# Patient Record
Sex: Male | Born: 1940 | Race: Black or African American | Hispanic: No | Marital: Single | Smoking: Never smoker
Health system: Southern US, Community
[De-identification: ages and names within clinical notes are randomized; demographics above are authoritative.]

## PROBLEM LIST (undated history)

## (undated) DIAGNOSIS — E7800 Pure hypercholesterolemia, unspecified: Secondary | ICD-10-CM

## (undated) DIAGNOSIS — C61 Malignant neoplasm of prostate: Secondary | ICD-10-CM

## (undated) DIAGNOSIS — F32A Depression, unspecified: Secondary | ICD-10-CM

## (undated) DIAGNOSIS — F4310 Post-traumatic stress disorder, unspecified: Secondary | ICD-10-CM

## (undated) DIAGNOSIS — M48062 Spinal stenosis, lumbar region with neurogenic claudication: Secondary | ICD-10-CM

## (undated) DIAGNOSIS — F329 Major depressive disorder, single episode, unspecified: Secondary | ICD-10-CM

## (undated) HISTORY — PX: PROSTATE SURGERY: SHX751

---

## 1998-04-29 ENCOUNTER — Ambulatory Visit (HOSPITAL_BASED_OUTPATIENT_CLINIC_OR_DEPARTMENT_OTHER): Admission: RE | Admit: 1998-04-29 | Discharge: 1998-04-29 | Payer: Self-pay | Admitting: Ophthalmology

## 2001-09-15 ENCOUNTER — Encounter: Payer: Self-pay | Admitting: Internal Medicine

## 2001-09-15 ENCOUNTER — Encounter: Admission: RE | Admit: 2001-09-15 | Discharge: 2001-09-15 | Payer: Self-pay | Admitting: Internal Medicine

## 2002-08-29 ENCOUNTER — Ambulatory Visit (HOSPITAL_BASED_OUTPATIENT_CLINIC_OR_DEPARTMENT_OTHER): Admission: RE | Admit: 2002-08-29 | Discharge: 2002-08-29 | Payer: Self-pay | Admitting: Ophthalmology

## 2004-01-21 ENCOUNTER — Ambulatory Visit (HOSPITAL_BASED_OUTPATIENT_CLINIC_OR_DEPARTMENT_OTHER): Admission: RE | Admit: 2004-01-21 | Discharge: 2004-01-21 | Payer: Self-pay | Admitting: Ophthalmology

## 2005-02-23 ENCOUNTER — Ambulatory Visit (HOSPITAL_COMMUNITY): Admission: RE | Admit: 2005-02-23 | Discharge: 2005-02-23 | Payer: Self-pay | Admitting: Internal Medicine

## 2006-11-07 ENCOUNTER — Emergency Department (HOSPITAL_COMMUNITY): Admission: EM | Admit: 2006-11-07 | Discharge: 2006-11-07 | Payer: Self-pay | Admitting: Emergency Medicine

## 2010-07-08 ENCOUNTER — Encounter: Admission: RE | Admit: 2010-07-08 | Discharge: 2010-07-08 | Payer: Self-pay | Admitting: Internal Medicine

## 2011-04-10 NOTE — Op Note (Signed)
NAME:  Lance Todd, Lance Todd                        ACCOUNT NO.:  [id number]   MEDICAL RECORD NO.:  [id number]                   PATIENT TYPE:  AMB   LOCATION:  DSC                                  FACILITY:  [facility]   PHYSICIAN:  Salley Scarlet., M.D.         DATE OF BIRTH:  [birth year]   DATE OF PROCEDURE:  01/21/2004  DATE OF DISCHARGE:                                 OPERATIVE REPORT   PREOPERATIVE DIAGNOSIS:  Chalazion upper lid, left eye.   POSTOPERATIVE DIAGNOSIS:  Chalazion upper lid, left eye.   OPERATION:  Chalazion excision upper lid, left eye.   ANESTHESIA:  Local using Xylocaine 1%.   PROCEDURE:  The upper lid of the left eye was inspected and there were two  chalazion located adjacent to each other along the medial aspect of the  upper lid of the left eye.  The patient was then prepped and draped in the  usual manner.  The lid was infiltrated with several mL of Xylocaine.  The  chalazion clamp was applied over the most medial lesion.  A cruciate  incision was made in the tarsus of the lesion and the lesion was curetted  using the chalazion curet.  The sac was incised using sharp and blunt  dissection.  The chalazion clamp was then applied over the larger lesion and  the same procedure was repeated.  Polysporin ophthalmic ointment and a  pressure patch was applied.  The patient tolerated the procedure well and  was discharged to the post anesthesia recovery room in satisfactory  condition with instructions to take Vicodin every 4 hours as needed for pain  and to see me in the office tomorrow for further evaluation.   DISCHARGE DIAGNOSIS:  Multiple chalazion upper lid, left eye.                                               Salley Scarlet., M.D.    TB/MEDQ  D:  01/21/2004  T:  01/21/2004  Job:  [id number]

## 2016-12-01 ENCOUNTER — Observation Stay (HOSPITAL_COMMUNITY): Payer: Medicare Other

## 2016-12-01 ENCOUNTER — Emergency Department (HOSPITAL_COMMUNITY): Payer: Medicare Other

## 2016-12-01 ENCOUNTER — Encounter (HOSPITAL_COMMUNITY): Payer: Self-pay | Admitting: Emergency Medicine

## 2016-12-01 ENCOUNTER — Inpatient Hospital Stay (HOSPITAL_COMMUNITY)
Admission: EM | Admit: 2016-12-01 | Discharge: 2016-12-08 | DRG: 075 | Disposition: A | Discharge status: Skilled Nursing Facility | Payer: Medicare Other | Attending: Internal Medicine | Admitting: Internal Medicine

## 2016-12-01 DIAGNOSIS — B003 Herpesviral meningitis: Secondary | ICD-10-CM | POA: Diagnosis not present

## 2016-12-01 DIAGNOSIS — N183 Chronic kidney disease, stage 3 unspecified: Secondary | ICD-10-CM | POA: Diagnosis present

## 2016-12-01 DIAGNOSIS — Z79899 Other long term (current) drug therapy: Secondary | ICD-10-CM

## 2016-12-01 DIAGNOSIS — R739 Hyperglycemia, unspecified: Secondary | ICD-10-CM | POA: Diagnosis not present

## 2016-12-01 DIAGNOSIS — B009 Herpesviral infection, unspecified: Secondary | ICD-10-CM | POA: Diagnosis present

## 2016-12-01 DIAGNOSIS — R262 Difficulty in walking, not elsewhere classified: Secondary | ICD-10-CM

## 2016-12-01 DIAGNOSIS — E869 Volume depletion, unspecified: Secondary | ICD-10-CM | POA: Diagnosis present

## 2016-12-01 DIAGNOSIS — R569 Unspecified convulsions: Secondary | ICD-10-CM

## 2016-12-01 DIAGNOSIS — G4709 Other insomnia: Secondary | ICD-10-CM

## 2016-12-01 DIAGNOSIS — E7800 Pure hypercholesterolemia, unspecified: Secondary | ICD-10-CM | POA: Diagnosis present

## 2016-12-01 DIAGNOSIS — F329 Major depressive disorder, single episode, unspecified: Secondary | ICD-10-CM | POA: Diagnosis present

## 2016-12-01 DIAGNOSIS — R4182 Altered mental status, unspecified: Secondary | ICD-10-CM | POA: Diagnosis not present

## 2016-12-01 DIAGNOSIS — E784 Other hyperlipidemia: Secondary | ICD-10-CM | POA: Diagnosis not present

## 2016-12-01 DIAGNOSIS — F32A Depression, unspecified: Secondary | ICD-10-CM | POA: Diagnosis present

## 2016-12-01 DIAGNOSIS — G9341 Metabolic encephalopathy: Secondary | ICD-10-CM | POA: Diagnosis present

## 2016-12-01 DIAGNOSIS — C61 Malignant neoplasm of prostate: Secondary | ICD-10-CM | POA: Diagnosis present

## 2016-12-01 DIAGNOSIS — F4310 Post-traumatic stress disorder, unspecified: Secondary | ICD-10-CM | POA: Diagnosis present

## 2016-12-01 DIAGNOSIS — N1832 Chronic kidney disease, stage 3b: Secondary | ICD-10-CM | POA: Diagnosis present

## 2016-12-01 DIAGNOSIS — N401 Enlarged prostate with lower urinary tract symptoms: Secondary | ICD-10-CM | POA: Diagnosis present

## 2016-12-01 DIAGNOSIS — E876 Hypokalemia: Secondary | ICD-10-CM | POA: Diagnosis not present

## 2016-12-01 DIAGNOSIS — G9340 Encephalopathy, unspecified: Secondary | ICD-10-CM | POA: Diagnosis present

## 2016-12-01 DIAGNOSIS — Z8546 Personal history of malignant neoplasm of prostate: Secondary | ICD-10-CM

## 2016-12-01 DIAGNOSIS — E785 Hyperlipidemia, unspecified: Secondary | ICD-10-CM | POA: Diagnosis present

## 2016-12-01 DIAGNOSIS — R338 Other retention of urine: Secondary | ICD-10-CM | POA: Diagnosis present

## 2016-12-01 DIAGNOSIS — G4089 Other seizures: Secondary | ICD-10-CM | POA: Diagnosis present

## 2016-12-01 DIAGNOSIS — N400 Enlarged prostate without lower urinary tract symptoms: Secondary | ICD-10-CM | POA: Diagnosis present

## 2016-12-01 DIAGNOSIS — D62 Acute posthemorrhagic anemia: Secondary | ICD-10-CM | POA: Diagnosis not present

## 2016-12-01 DIAGNOSIS — G912 (Idiopathic) normal pressure hydrocephalus: Secondary | ICD-10-CM | POA: Diagnosis present

## 2016-12-01 DIAGNOSIS — G039 Meningitis, unspecified: Secondary | ICD-10-CM

## 2016-12-01 DIAGNOSIS — I471 Supraventricular tachycardia: Secondary | ICD-10-CM | POA: Diagnosis present

## 2016-12-01 DIAGNOSIS — G4700 Insomnia, unspecified: Secondary | ICD-10-CM | POA: Diagnosis present

## 2016-12-01 DIAGNOSIS — N179 Acute kidney failure, unspecified: Secondary | ICD-10-CM | POA: Diagnosis present

## 2016-12-01 DIAGNOSIS — G030 Nonpyogenic meningitis: Secondary | ICD-10-CM | POA: Diagnosis present

## 2016-12-01 HISTORY — DX: Malignant neoplasm of prostate: C61

## 2016-12-01 HISTORY — DX: Depression, unspecified: F32.A

## 2016-12-01 HISTORY — DX: Pure hypercholesterolemia, unspecified: E78.00

## 2016-12-01 HISTORY — DX: Post-traumatic stress disorder, unspecified: F43.10

## 2016-12-01 HISTORY — DX: Major depressive disorder, single episode, unspecified: F32.9

## 2016-12-01 HISTORY — DX: Spinal stenosis, lumbar region with neurogenic claudication: M48.062

## 2016-12-01 LAB — RAPID URINE DRUG SCREEN, HOSP PERFORMED
AMPHETAMINES: NOT DETECTED
Barbiturates: NOT DETECTED
Benzodiazepines: NOT DETECTED
Cocaine: NOT DETECTED
OPIATES: NOT DETECTED
Tetrahydrocannabinol: NOT DETECTED

## 2016-12-01 LAB — CBC WITH DIFFERENTIAL/PLATELET
BASOS PCT: 0 %
Basophils Absolute: 0 10*3/uL (ref 0.0–0.1)
EOS ABS: 0.1 10*3/uL (ref 0.0–0.7)
EOS PCT: 2 %
HCT: 32.7 % — ABNORMAL LOW (ref 39.0–52.0)
HEMOGLOBIN: 11.6 g/dL — AB (ref 13.0–17.0)
Lymphocytes Relative: 19 %
Lymphs Abs: 1 10*3/uL (ref 0.7–4.0)
MCH: 28.4 pg (ref 26.0–34.0)
MCHC: 35.5 g/dL (ref 30.0–36.0)
MCV: 80.1 fL (ref 78.0–100.0)
MONO ABS: 0.3 10*3/uL (ref 0.1–1.0)
MONOS PCT: 6 %
Neutro Abs: 3.7 10*3/uL (ref 1.7–7.7)
Neutrophils Relative %: 73 %
PLATELETS: 171 10*3/uL (ref 150–400)
RBC: 4.08 MIL/uL — ABNORMAL LOW (ref 4.22–5.81)
RDW: 13.4 % (ref 11.5–15.5)
WBC: 5.1 10*3/uL (ref 4.0–10.5)

## 2016-12-01 LAB — COMPREHENSIVE METABOLIC PANEL
ALBUMIN: 3.4 g/dL — AB (ref 3.5–5.0)
ALT: 15 U/L — ABNORMAL LOW (ref 17–63)
AST: 18 U/L (ref 15–41)
Alkaline Phosphatase: 41 U/L (ref 38–126)
Anion gap: 5 (ref 5–15)
BUN: 22 mg/dL — ABNORMAL HIGH (ref 6–20)
CHLORIDE: 109 mmol/L (ref 101–111)
CO2: 25 mmol/L (ref 22–32)
Calcium: 9 mg/dL (ref 8.9–10.3)
Creatinine, Ser: 2.03 mg/dL — ABNORMAL HIGH (ref 0.61–1.24)
GFR calc non Af Amer: 30 mL/min — ABNORMAL LOW (ref >60)
GFR, EST AFRICAN AMERICAN: 35 mL/min — AB (ref >60)
GLUCOSE: 152 mg/dL — AB (ref 65–99)
POTASSIUM: 4.3 mmol/L (ref 3.5–5.1)
SODIUM: 139 mmol/L (ref 135–145)
Total Bilirubin: 0.6 mg/dL (ref 0.3–1.2)
Total Protein: 7.2 g/dL (ref 6.5–8.1)

## 2016-12-01 LAB — URINALYSIS, ROUTINE W REFLEX MICROSCOPIC
BILIRUBIN URINE: NEGATIVE
GLUCOSE, UA: NEGATIVE mg/dL
HGB URINE DIPSTICK: NEGATIVE
Ketones, ur: NEGATIVE mg/dL
LEUKOCYTES UA: NEGATIVE
NITRITE: NEGATIVE
PROTEIN: 100 mg/dL — AB
Specific Gravity, Urine: 1.015 (ref 1.005–1.030)
pH: 5 (ref 5.0–8.0)

## 2016-12-01 LAB — FOLATE: FOLATE: 30.9 ng/mL (ref >5.9)

## 2016-12-01 LAB — PHOSPHORUS: Phosphorus: 1.6 mg/dL — ABNORMAL LOW (ref 2.5–4.6)

## 2016-12-01 LAB — LACTIC ACID, PLASMA: LACTIC ACID, VENOUS: 1.7 mmol/L (ref 0.5–1.9)

## 2016-12-01 LAB — AMMONIA: Ammonia: 21 umol/L (ref 9–35)

## 2016-12-01 LAB — CK: CK TOTAL: 113 U/L (ref 49–397)

## 2016-12-01 LAB — TSH: TSH: 1.05 u[IU]/mL (ref 0.350–4.500)

## 2016-12-01 LAB — MAGNESIUM: Magnesium: 2.3 mg/dL (ref 1.7–2.4)

## 2016-12-01 LAB — TROPONIN I

## 2016-12-01 MED ORDER — SODIUM CHLORIDE 0.9 % IV SOLN: 75.0000 mL/h | INTRAVENOUS | Status: DC

## 2016-12-01 MED ORDER — ACETAMINOPHEN 650 MG RE SUPP: 650.0000 mg | Freq: Four times a day (QID) | RECTAL | Status: DC | PRN

## 2016-12-01 MED ORDER — PRAVASTATIN SODIUM 10 MG PO TABS
10.0000 mg | ORAL_TABLET | Freq: Every day | ORAL | Status: DC
  Administered 2016-12-04 – 2016-12-07 (×4): 10 mg via ORAL
  Filled 2016-12-01 (×7): qty 1

## 2016-12-01 MED ORDER — SODIUM CHLORIDE 0.9% FLUSH
3.0000 mL | Freq: Two times a day (BID) | INTRAVENOUS | Status: DC
  Administered 2016-12-02 – 2016-12-08 (×12): 3 mL via INTRAVENOUS

## 2016-12-01 MED ORDER — SODIUM CHLORIDE 0.9 % IV SOLN
INTRAVENOUS | Status: DC
  Administered 2016-12-01: 1000 mL via INTRAVENOUS
  Administered 2016-12-03: 16:00:00 via INTRAVENOUS
  Administered 2016-12-03: 1000 mL via INTRAVENOUS
  Administered 2016-12-04: 09:00:00 via INTRAVENOUS
  Administered 2016-12-04: 1000 mL via INTRAVENOUS

## 2016-12-01 MED ORDER — HEPARIN SODIUM (PORCINE) 5000 UNIT/ML IJ SOLN
5000.0000 [IU] | Freq: Three times a day (TID) | INTRAMUSCULAR | Status: DC
  Administered 2016-12-02 – 2016-12-03 (×5): 5000 [IU] via SUBCUTANEOUS
  Filled 2016-12-01 (×4): qty 1

## 2016-12-01 MED ORDER — SERTRALINE HCL 100 MG PO TABS: 100.0000 mg | ORAL_TABLET | Freq: Every day | ORAL | Status: DC

## 2016-12-01 MED ORDER — ONDANSETRON HCL 4 MG/2ML IJ SOLN
4.0000 mg | Freq: Once | INTRAMUSCULAR | Status: AC
  Administered 2016-12-01: 4 mg via INTRAVENOUS

## 2016-12-01 MED ORDER — LORAZEPAM 2 MG/ML IJ SOLN
1.0000 mg | INTRAMUSCULAR | Status: DC | PRN
  Administered 2016-12-01: 1 mg via INTRAVENOUS
  Administered 2016-12-02 (×2): 2 mg via INTRAVENOUS
  Filled 2016-12-01 (×3): qty 1

## 2016-12-01 MED ORDER — OMEGA-3-ACID ETHYL ESTERS 1 G PO CAPS
1.0000 g | ORAL_CAPSULE | Freq: Every day | ORAL | Status: DC
  Administered 2016-12-05 – 2016-12-08 (×4): 1 g via ORAL
  Filled 2016-12-01 (×4): qty 1

## 2016-12-01 MED ORDER — TRAZODONE HCL 50 MG PO TABS: 150.0000 mg | ORAL_TABLET | Freq: Every evening | ORAL | Status: DC | PRN

## 2016-12-01 MED ORDER — ACETAMINOPHEN 325 MG PO TABS
650.0000 mg | ORAL_TABLET | Freq: Four times a day (QID) | ORAL | Status: DC | PRN
  Filled 2016-12-01: qty 2

## 2016-12-01 MED ORDER — TAMSULOSIN HCL 0.4 MG PO CAPS: 0.8000 mg | ORAL_CAPSULE | Freq: Every day | ORAL | Status: DC

## 2016-12-01 MED ORDER — PROMETHAZINE HCL 25 MG PO TABS: 12.5000 mg | ORAL_TABLET | Freq: Four times a day (QID) | ORAL | Status: DC | PRN

## 2016-12-01 MED ORDER — QUETIAPINE FUMARATE 100 MG PO TABS: 100.0000 mg | ORAL_TABLET | Freq: Every day | ORAL | Status: DC

## 2016-12-01 MED ORDER — LORAZEPAM 2 MG/ML IJ SOLN
INTRAMUSCULAR | Status: AC
  Administered 2016-12-01: 2 mg
  Filled 2016-12-01: qty 1

## 2016-12-01 MED ORDER — ONDANSETRON HCL 4 MG/2ML IJ SOLN: 4.0000 mg | Freq: Four times a day (QID) | INTRAMUSCULAR | Status: DC | PRN

## 2016-12-01 NOTE — Progress Notes (Addendum)
S/O: Discussed case with Dr. Cristobal Goldmann in sign out. Two day history of confusion. Displayed dressing apraxia at home. GTC seizure in the ED, stopped with Ativan. AST and ALT unremarkable. Ammonia normal at 21. TSH normal. Na, Mg and Ca are normal.   A/R:  -Neurology will continue to follow.  -I have discontinued tamsulosin as it can cause confusion in the elderly. Will need monitoring of I/Os and bladder scans after stopping this medication, given history of BPH and prostate CA.  -Infectious workup.  -MRI when able.  -NPH on DDx for progressive memory and gait changs at home, but seizure would be atypical.  -Volume depletion likely playing a role. BUN/Cr is 22/2.03. Recommend gentle IV hydration.  -May need LP, but expected to be low-yield given normal WBC and no fever. Unable to obtain LP in ED due to agitation.  -If seizure recurs or if EEG shows electrographic seizures, load Keppra 1000 mg IV x 1 and start scheduled Keppra at 750 mg IV or PO BID.   Electronically signed: Dr. Kerney Elbe

## 2016-12-01 NOTE — ED Notes (Signed)
Patient transported to MRI 

## 2016-12-01 NOTE — Progress Notes (Signed)
MRI completed. No acute intracranial process is seen on this moderate to severely motion degraded examination. 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  EEG technician called. Bedside EEG will be assessed after leads have been placed.   A/R: 1. EEG monitoring.  2. Will need high-volume LP in the morning: Remove 30 - 50 cc of CSF after measuring opening pressure. Send for cell count, protein, glucose and cryptococcal antigen. Obtain PT consult for measurement of the following gait parameters at 1, 2, 4 and 6 hours following LP: Time to ambulate 20 feet, number of steps taken while ambulating 20 feet, approximate height of foot elevation above floor per step, qualitative assessment for shuffling of gait, assessment of turns (number of steps per turn).  3. Seizure precautions.   Electronically signed: Dr. Kerney Elbe

## 2016-12-01 NOTE — ED Triage Notes (Signed)
Pt has been confused for 4th day.  Wife told EMS she gave him underwear to put on Saturday and he placed it over his clothing.  Pt stuttering speech, has to think before answering questions.  Denies pain.  Alert to place, president, self.

## 2016-12-01 NOTE — Progress Notes (Signed)
S/O: EEG running and evaluated at the bedside. No electrographic seizures are seen.   BP 114/81 (BP Location: Right Arm)   Pulse 75   Temp 98.9 F (37.2 C)   Resp (!) 22   Ht 5\' 5"  (1.651 m)   Wt 81.6 kg (180 lb)   SpO2 99%   BMI 29.95 kg/m   Exam: Patient encephalopathic without verbal responses to questions. Not following commands. Nuchal rigidity and warm, slightly clammy skin noted.   A/R: Overall presentation worrisome for meningitis. Have discussed with Dr. Baltazar Najjar from Medicine team. We are in consensus that empiric antibiotics should be started. Will be difficult to LP without image guidance given meningismus, mild agitation and inability to follow commands. Will need fluoroscopically guided LP in the AM.   Electronically signed: Dr. Kerney Elbe

## 2016-12-01 NOTE — Progress Notes (Signed)
STAT LTM started 

## 2016-12-01 NOTE — ED Provider Notes (Signed)
[facility] DEPT Provider Note   CSN: [id number] Arrival date & time: 12/01/16  1306     History   Chief Complaint Chief Complaint  Patient presents with  . Altered Mental Status    HPI Lance Todd is a 76 y.o. male.  The history is provided by the patient and the EMS personnel. The history is limited by the condition of the patient.    Patient is a 76 year old male with a past medical history of cancer, depression, agent orange exposure, hyperlipidemia, PTSD who presents today with altered mental status. EMS reports this started approximately 2-3 days ago. Wife had stated to EMS that when he was told to put on a new underwear he was trying to put the underwear on over his jeans. Currently patient denies any headache, chest pain, shortness of breath, nausea, vomiting, diarrhea. States he has not had any recent fevers or chills. He is disoriented to time but oriented to person and place. He notably takes Seroquel, Zoloft, trazodone, prazosin, tamsulosin, vitamin D.  Past Medical History:  Diagnosis Date  . Cancer determined by prostate biopsy ([place])   . Depression   . Hypercholesterolemia   . PTSD (post-traumatic stress disorder)     Patient Active Problem List   Diagnosis Date Noted  . Altered mental status, unspecified 12/01/2016    Past Surgical History:  Procedure Laterality Date  . PROSTATE SURGERY         Home Medications    Prior to Admission medications   Medication Sig Start Date End Date Taking? Authorizing Provider  Carboxymethylcellulose Sod PF 0.25 % SOLN Place 1 drop into both eyes daily.   Yes Historical Provider, MD  cetirizine (ZYRTEC) 10 MG tablet Take 10 mg by mouth daily as needed for allergies.   Yes Historical Provider, MD  Cholecalciferol (VITAMIN D-3) 1000 units CAPS Take 1 capsule by mouth daily.   Yes Historical Provider, MD  docusate sodium (COLACE) 100 MG capsule Take 200 mg by mouth daily.   Yes Historical Provider, MD  omega-3  acid ethyl esters (LOVAZA) 1 g capsule Take 1 g by mouth daily.   Yes Historical Provider, MD  pravastatin (PRAVACHOL) 10 MG tablet Take 10 mg by mouth at bedtime.   Yes Historical Provider, MD  prazosin (MINIPRESS) 2 MG capsule Take 2 mg by mouth at bedtime.   Yes Historical Provider, MD  QUEtiapine (SEROQUEL) 100 MG tablet Take 100 mg by mouth at bedtime.   Yes Historical Provider, MD  sertraline (ZOLOFT) 100 MG tablet Take 100 mg by mouth daily.   Yes Historical Provider, MD  tamsulosin (FLOMAX) 0.4 MG CAPS capsule Take 0.8 mg by mouth at bedtime.   Yes Historical Provider, MD  traZODone (DESYREL) 150 MG tablet Take 150 mg by mouth at bedtime.   Yes Historical Provider, MD    Family History History reviewed. No pertinent family history.  Social History Social History  Substance Use Topics  . Smoking status: Never Smoker  . Smokeless tobacco: Not on file  . Alcohol use No     Allergies   Patient has no known allergies.   Review of Systems Review of Systems  Unable to perform ROS: Mental status change  Constitutional: Negative for chills and fever.  HENT: Negative for congestion.   Respiratory: Negative for cough and shortness of breath.   Gastrointestinal: Negative for nausea and vomiting.  Neurological: Negative for weakness and numbness.  Psychiatric/Behavioral: Positive for confusion. Negative for agitation.     Physical  Exam Updated Vital Signs BP 143/68   Pulse 62   Temp 98.2 F (36.8 C) (Oral)   Resp 23   Ht 5\' 5"  (1.651 m)   Wt 81.6 kg   SpO2 97%   BMI 29.95 kg/m   Physical Exam  Constitutional: No distress.  HENT:  Head: Normocephalic and atraumatic.  Eyes: Conjunctivae are normal.  Neck: Normal range of motion. Neck supple.  Cardiovascular: Normal rate and regular rhythm.   No murmur heard. Pulmonary/Chest: Effort normal and breath sounds normal. No respiratory distress.  Abdominal: Soft. He exhibits no distension. There is no tenderness.    Musculoskeletal: He exhibits no edema.  Neurological: He is alert. He has normal strength. He is disoriented. No cranial nerve deficit (CN II-XII intact). GCS eye subscore is 4. GCS verbal subscore is 4. GCS motor subscore is 6.  Skin: Skin is warm and dry. He is not diaphoretic.  Psychiatric: He has a normal mood and affect.  Nursing note and vitals reviewed.    ED Treatments / Results  Labs (all labs ordered are listed, but only abnormal results are displayed) Labs Reviewed  CBC WITH DIFFERENTIAL/PLATELET - Abnormal; Notable for the following:       Result Value   RBC 4.08 (*)    Hemoglobin 11.6 (*)    HCT 32.7 (*)    All other components within normal limits  COMPREHENSIVE METABOLIC PANEL - Abnormal; Notable for the following:    Glucose, Bld 152 (*)    BUN 22 (*)    Creatinine, Ser 2.03 (*)    Albumin 3.4 (*)    ALT 15 (*)    GFR calc non Af Amer 30 (*)    GFR calc Af Amer 35 (*)    All other components within normal limits  URINALYSIS, ROUTINE W REFLEX MICROSCOPIC - Abnormal; Notable for the following:    APPearance HAZY (*)    Protein, ur 100 (*)    Bacteria, UA RARE (*)    Squamous Epithelial / LPF 0-5 (*)    All other components within normal limits  AMMONIA  RAPID URINE DRUG SCREEN, HOSP PERFORMED  TSH  CBG MONITORING, ED    EKG  EKG Interpretation  Date/Time:  Tuesday December 01 2016 13:13:29 EST Ventricular Rate:  68 PR Interval:    QRS Duration: 91 QT Interval:  406 QTC Calculation: 432 R Axis:   31 Text Interpretation:  Sinus rhythm Probable left atrial enlargement Abnormal R-wave progression, early transition No STEMI Confirmed by TEGELER MD, CHRISTOPHER [phone]) on 12/01/2016 3:45:13 PM       Radiology Dg Chest 1 View  Result Date: 12/01/2016 CLINICAL DATA:  Pt came in with confusion, that started today, he does not recall any injury, no chest complaints, came from ct, non smoker EXAM: CHEST 1 VIEW COMPARISON:  None. FINDINGS: Normal mediastinum  and cardiac silhouette. Normal pulmonary vasculature. No evidence of effusion, infiltrate, or pneumothorax. No acute bony abnormality. IMPRESSION: No acute cardiopulmonary process. Electronically Signed   By: Suzy Bouchard M.D.   On: 12/01/2016 14:03   Ct Head Wo Contrast  Result Date: 12/01/2016 CLINICAL DATA:  Confusion EXAM: CT HEAD WITHOUT CONTRAST TECHNIQUE: Contiguous axial images were obtained from the base of the skull through the vertex without intravenous contrast. COMPARISON:  MRI 02/23/2005 FINDINGS: Brain: No acute territorial infarction or intracranial hemorrhage is visualized. Mildly enlarged ventricles since the prior exam. No significant atrophy. No focal mass, midline shift or mass effect. Vascular: No hyperdense vessels.  Mild carotid artery calcifications. Skull: Mastoids are clear.  No skull fracture Sinuses/Orbits: Mucosal thickening within the ethmoids and maxillary sinuses with small fluid levels in the maxillary sinuses. No acute orbital abnormality. Other: None IMPRESSION: 1. No CT evidence for intracranial hemorrhage or large vessel territorial infarct or intracranial mass 2. Ventricles are slightly enlarged since the prior exam. Mild atrophy is present, however ventricular size appears somewhat disproportionate to atrophy, this raises the possibility of normal pressure hydrocephalus. Clinical correlation is required. Electronically Signed   By: Donavan Foil M.D.   On: 12/01/2016 14:06    Procedures Procedures (including critical care time)  Medications Ordered in ED Medications - No data to display   Initial Impression / Assessment and Plan / ED Course  I have reviewed the triage vital signs and the nursing notes.  Pertinent labs & imaging results that were available during my care of the patient were reviewed by me and considered in my medical decision making (see chart for details).  Clinical Course     Patient is a 76 year old male with a past medical history of  cancer, depression, agent orange exposure, hyperlipidemia, PTSD who presents today with altered mental status. On exam, patient is afebrile, no infectious signs or symptoms on history or exam. He is clearly disoriented. CT head shows possible NPH, but I doubt this is the cause for his sudden change.   CXR, UA, Ammonia, TSH unremarkable. Considered polypharmacy as an etiology for his symptoms.   Discussed with medicine who will admit the patient for further evaluation and management. Patient remains stable at time of admission.   Addendum: Was discussing plan to admit with family, patient actively began having diffuse seizure like activity. Lasted approximately 30 seconds and resolved. Patient currently seems post-ictal. Family denies any previous similar symptoms or seizure history. Paged neurology for recommendations.   Final Clinical Impressions(s) / ED Diagnoses   Final diagnoses:  Altered mental status    New Prescriptions New Prescriptions   No medications on file     Theodosia Quay, MD 12/01/16 Brewer, MD 12/01/16 Valentine, MD 12/02/16 [id number]

## 2016-12-01 NOTE — H&P (Signed)
History and Physical    Lance Todd [id number] DOB: July 07, 1941 DOA: 12/01/2016  PCP: No primary care provider on file. Patient coming from: *Home  Chief Complaint: Confusion  HPI: Lance Todd is a 75 y.o. male with medical history significant of BPH/prostate cancer status post surgical resection, should, HLD, PTSD. Patient is former [employer] conservation officer. Patient presenting with 2-3 day history of increasing confusion. Patient's primary care provider through the [place]. History provided by patient's wife and friend. Level V caveat applies this patient is unable to provide reliable history at this time given his obtunded state after having a seizure and receiving Ativan. At times at home patient has been noted to be staring likely standing in the middle of a room. When asked to explain his behavior patient was unable to provide any good reason. Denies any visual or auditory hallucinations or depressive symptoms. No focal deficits reported by family such as unilateral facial weakness or limb weakness. Decreased appetite over this period of time. Patient has stated from time to time that he is feeling very tired and sleepy but denies any neck stiffness, headache, chest pain, palpitations, nausea, vomiting, abdominal pain, fevers, back pain. Prior to onset of symptoms patient has had intermittent episodes of being forgetful. Patient does administer his own medications and there've been no problems with this previous to current event.  Prior to onset of symptoms patient had recently.with minor URI symptoms which was easily treated and resolving after over-the-counter cold medicines. Family unsure of which medicines.   ED Course: Objective findings outlined below. While in the ED patient had a tonic-clonic seizure that was witnessed by EDP. Given 2 mg of Ativan with cessation of the seizure.  Review of Systems: As per HPI otherwise 10 point review of systems negative.   Ambulatory Status: No restrictions at  baseline  Past Medical History:  Diagnosis Date  . Cancer determined by prostate biopsy ([place])   . Depression   . Hypercholesterolemia   . PTSD (post-traumatic stress disorder)     Past Surgical History:  Procedure Laterality Date  . PROSTATE SURGERY      Social History   Social History  . Marital status: Single    Spouse name: N/A  . Number of children: N/A  . Years of education: N/A   Occupational History  . Not on file.   Social History Main Topics  . Smoking status: Never Smoker  . Smokeless tobacco: Not on file  . Alcohol use No  . Drug use: No  . Sexual activity: Not on file   Other Topics Concern  . Not on file   Social History Narrative  . No narrative on file    No Known Allergies  Family History  Problem Relation Age of Onset  . Seizures Sister     Prior to Admission medications   Medication Sig Start Date End Date Taking? Authorizing Provider  Carboxymethylcellulose Sod PF 0.25 % SOLN Place 1 drop into both eyes daily.   Yes Historical Provider, MD  cetirizine (ZYRTEC) 10 MG tablet Take 10 mg by mouth daily as needed for allergies.   Yes Historical Provider, MD  Cholecalciferol (VITAMIN D-3) 1000 units CAPS Take 1 capsule by mouth daily.   Yes Historical Provider, MD  docusate sodium (COLACE) 100 MG capsule Take 200 mg by mouth daily.   Yes Historical Provider, MD  omega-3 acid ethyl esters (LOVAZA) 1 g capsule Take 1 g by mouth daily.   Yes Historical Provider, MD  pravastatin (PRAVACHOL)  10 MG tablet Take 10 mg by mouth at bedtime.   Yes Historical Provider, MD  prazosin (MINIPRESS) 2 MG capsule Take 2 mg by mouth at bedtime.   Yes Historical Provider, MD  QUEtiapine (SEROQUEL) 100 MG tablet Take 100 mg by mouth at bedtime.   Yes Historical Provider, MD  sertraline (ZOLOFT) 100 MG tablet Take 100 mg by mouth daily.   Yes Historical Provider, MD  tamsulosin (FLOMAX) 0.4 MG CAPS capsule Take 0.8 mg by mouth at bedtime.   Yes Historical Provider, MD    traZODone (DESYREL) 150 MG tablet Take 150 mg by mouth at bedtime.   Yes Historical Provider, MD    Physical Exam: Vitals:   12/01/16 1615 12/01/16 1630 12/01/16 1645 12/01/16 1700  BP: 122/71 126/76 136/76 129/73  Pulse: 101 100 107 96  Resp: (!) 28 (!) 28 24 24   Temp:      TempSrc:      SpO2: 98% 98% 96% 99%  Weight:      Height:         General:  Appears calm and comfortable Eyes:  PERRL, Small left medial scleral hemorrhage. normal lids,  ENT:  grossly normal hearing, lips & tongue, mmm Neck:  no LAD, masses or thyromegaly Cardiovascular:  RRR, no m/r/g. No LE edema.  Respiratory:  CTA bilaterally, no w/r/r. Normal respiratory effort. Abdomen:  soft, ntnd, NABS Skin:  no rash or induration seen on limited exam Musculoskeletal:  Difficult to fully ascertain function given patient's obtunded state. No gross abnormalities appreciated. Does withdraw to painful stimuli.  Psychiatric: Very sleepy. Not following commands. Occasional lip smacking. Neurologic:  Facial movements intact. No extremity contractures, minimally responsive to painful stimuli   Labs on Admission: I have personally reviewed following labs and imaging studies  CBC:  Recent Labs Lab 12/01/16 1336  WBC 5.1  NEUTROABS 3.7  HGB 11.6*  HCT 32.7*  MCV 80.1  PLT [id number]   Basic Metabolic Panel:  Recent Labs Lab 12/01/16 1336  NA 139  K 4.3  CL 109  CO2 25  GLUCOSE 152*  BUN 22*  CREATININE 2.03*  CALCIUM 9.0   GFR: Estimated Creatinine Clearance: 30 mL/min (by C-G formula based on SCr of 2.03 mg/dL (H)). Liver Function Tests:  Recent Labs Lab 12/01/16 1336  AST 18  ALT 15*  ALKPHOS 41  BILITOT 0.6  PROT 7.2  ALBUMIN 3.4*   No results for input(s): LIPASE, AMYLASE in the last 168 hours.  Recent Labs Lab 12/01/16 1334  AMMONIA 21   Coagulation Profile: No results for input(s): INR, PROTIME in the last 168 hours. Cardiac Enzymes: No results for input(s): CKTOTAL, CKMB, CKMBINDEX,  TROPONINI in the last 168 hours. BNP (last 3 results) No results for input(s): PROBNP in the last 8760 hours. HbA1C: No results for input(s): HGBA1C in the last 72 hours. CBG: No results for input(s): GLUCAP in the last 168 hours. Lipid Profile: No results for input(s): CHOL, HDL, LDLCALC, TRIG, CHOLHDL, LDLDIRECT in the last 72 hours. Thyroid Function Tests:  Recent Labs  12/01/16 1336  TSH 1.050   Anemia Panel: No results for input(s): VITAMINB12, FOLATE, FERRITIN, TIBC, IRON, RETICCTPCT in the last 72 hours. Urine analysis:    Component Value Date/Time   COLORURINE YELLOW 12/01/2016 1326   APPEARANCEUR HAZY (A) 12/01/2016 1326   LABSPEC 1.015 12/01/2016 1326   PHURINE 5.0 12/01/2016 1326   GLUCOSEU NEGATIVE 12/01/2016 1326   HGBUR NEGATIVE 12/01/2016 [place] 12/01/2016 1326  KETONESUR NEGATIVE 12/01/2016 1326   PROTEINUR 100 (A) 12/01/2016 1326   NITRITE NEGATIVE 12/01/2016 1326   LEUKOCYTESUR NEGATIVE 12/01/2016 1326    Creatinine Clearance: Estimated Creatinine Clearance: 30 mL/min (by C-G formula based on SCr of 2.03 mg/dL (H)).  Sepsis Labs: @LABRCNTIP (procalcitonin:4,lacticidven:4) )No results found for this or any previous visit (from the past 240 hour(s)).   Radiological Exams on Admission: Dg Chest 1 View  Result Date: 12/01/2016 CLINICAL DATA:  Pt came in with confusion, that started today, he does not recall any injury, no chest complaints, came from ct, non smoker EXAM: CHEST 1 VIEW COMPARISON:  None. FINDINGS: Normal mediastinum and cardiac silhouette. Normal pulmonary vasculature. No evidence of effusion, infiltrate, or pneumothorax. No acute bony abnormality. IMPRESSION: No acute cardiopulmonary process. Electronically Signed   By: Suzy Bouchard M.D.   On: 12/01/2016 14:03   Ct Head Wo Contrast  Result Date: 12/01/2016 CLINICAL DATA:  Confusion EXAM: CT HEAD WITHOUT CONTRAST TECHNIQUE: Contiguous axial images were obtained from  the base of the skull through the vertex without intravenous contrast. COMPARISON:  MRI 02/23/2005 FINDINGS: Brain: No acute territorial infarction or intracranial hemorrhage is visualized. Mildly enlarged ventricles since the prior exam. No significant atrophy. No focal mass, midline shift or mass effect. Vascular: No hyperdense vessels. Mild carotid artery calcifications. Skull: Mastoids are clear.  No skull fracture Sinuses/Orbits: Mucosal thickening within the ethmoids and maxillary sinuses with small fluid levels in the maxillary sinuses. No acute orbital abnormality. Other: None IMPRESSION: 1. No CT evidence for intracranial hemorrhage or large vessel territorial infarct or intracranial mass 2. Ventricles are slightly enlarged since the prior exam. Mild atrophy is present, however ventricular size appears somewhat disproportionate to atrophy, this raises the possibility of normal pressure hydrocephalus. Clinical correlation is required. Electronically Signed   By: Donavan Foil M.D.   On: 12/01/2016 14:06    EKG: Independently reviewed. Sinus. No ACS  Assessment/Plan Active Problems:   Altered mental status, unspecified   Seizure ([place])   Renal insufficiency   Hyperglycemia   Depression   Prostate cancer ([place])   HLD (hyperlipidemia)   Insomnia   AMS/SZR: Unknown etiology. Cursory pill count does not reveal any overt sign of inadvertent overdose. Medications have been stable for sometimes a.polypharmacy. Workup currently unremarkable from an infectious standpoint. CT head unremarkable other than some possible normal pressure hydrocephalus. Per EDP report patient with mild dysmetria of the left upper extremity. At time of my exam patient had had a seizure and 2 mg of Ativan and is completely post ictal/obtunded. Doubt metabolic derangement. Family history significant for a sister with seizures. UDS negative. CXR unremarkable. Ammonia nml.  - Neuro consult by EDP. Follow-up on recommendations. -  EEG - MRI - CK, lactic acid - Neuro checks - Ativan when necessary  Renal insufficiency: Codeine 2.03. No history of the same. Unsure if this is due to recent description of oral aversion and dehydration versus baseline. - Gentle IVF - BMP in a.m.  Hyperglycemia: 152 on admission. No history of diabetes. May be stress related. 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 Unsure why on flomax and minipress. - continue Flomax in am - Bladder scan Q8 until awake  Insomnia:  - continue trazodone prn starting 12/02/16  DVT prophylaxis: Hep  Code Status: FULL  Family Communication: Wife and friend  Disposition Plan: pending workup  and improvement  Consults called: Neuro - called by EDP  Admission status: obs    Chauncey Sciulli J MD [facility]  If 7PM-7AM, please contact night-coverage [email or web address] Password [email or web address]  12/01/2016, 5:27 PM

## 2016-12-01 NOTE — ED Notes (Signed)
Family remains at bedside.  Pt beginning to waken from post-ictal/ativan state.

## 2016-12-01 NOTE — ED Notes (Signed)
Condom catheter in place

## 2016-12-01 NOTE — ED Notes (Signed)
Called into room by family, pt experienced seizure, no previous history.  Pt appears to have bitten tip of tongue.  Dr Sherry Ruffing held jaw for airway control.  Placed pt on 3L O2 per [place].  Post ictal currently, also given 2 mg ativan IV within two minutes of seizure activity ending.

## 2016-12-01 NOTE — Progress Notes (Signed)
Unable to do neuro assessment at this time.  Still postictal.  Will continue to monitor.  Patient was placed on cardiac monitor. Pulse oximeter was ordered.  Room set up seizure precautions with pads on the bed, oxygen and suction set up at bedside. All four siderails up at wifes request, bed alarm on. Will monitor and follow physicians order.

## 2016-12-01 NOTE — Progress Notes (Signed)
Called for report. Currently patient off floor to MRI.Patient being admitted with new onset seizure disorder, AMS. Awaiting patient.

## 2016-12-01 NOTE — Consult Note (Signed)
NEURO HOSPITALIST CONSULT NOTE   Requestig physician: Dr. Marily Memos   Reason for Consult: New onset seizure    History obtained from:  Patient, family and chart  HPI:                                                                                                                                          Lance Todd is an 76 year old male with a past medical history of cancer, depression, agent orange exposure, hyperlipidemia, PTSD who presents today with altered mental status. EMS reports this started approximately 2-3 days ago. Wife had stated to EMS that when he was told to put on a new underwear he was trying to put the underwear on over his jeans. Currently patient denies any headache, chest pain, shortness of breath, nausea, vomiting, diarrhea. States he has not had any recent fevers or chills. Home meds include Seroquel, Zoloft, trazodone, prazosin, tamsulosin, vitamin D.  Past Medical History:  Diagnosis Date  . Cancer determined by prostate biopsy ([place])   . Depression   . Hypercholesterolemia   . PTSD (post-traumatic stress disorder)     Past Surgical History:  Procedure Laterality Date  . PROSTATE SURGERY      Family History  Problem Relation Age of Onset  . Seizures Sister       Social History:  reports that he has never smoked. He does not have any smokeless tobacco history on file. He reports that he does not drink alcohol or use drugs.  No Known Allergies  MEDICATIONS:                                                                                                                     I have reviewed the patient's current medications.   ROS:  History obtained from chart review and the patient  General ROS: negative for - chills, fatigue, fever, night sweats, weight gain or weight loss Psychological ROS:  negative for - behavioral disorder, hallucinations, memory difficulties, mood swings or suicidal ideation Ophthalmic ROS: negative for - blurry vision, double vision, eye pain or loss of vision ENT ROS: negative for - epistaxis, nasal discharge, oral lesions, sore throat, tinnitus or vertigo Allergy and Immunology ROS: negative for - hives or itchy/watery eyes Hematological and Lymphatic ROS: negative for - bleeding problems, bruising or swollen lymph nodes Endocrine ROS: negative for - galactorrhea, hair pattern changes, polydipsia/polyuria or temperature intolerance Respiratory ROS: negative for - cough, hemoptysis, shortness of breath or wheezing Cardiovascular ROS: negative for - chest pain, dyspnea on exertion, edema or irregular heartbeat Gastrointestinal ROS: negative for - abdominal pain, diarrhea, hematemesis, nausea/vomiting or stool incontinence Genito-Urinary ROS: negative for - dysuria, hematuria, incontinence or urinary frequency/urgency Musculoskeletal ROS: negative for - joint swelling or muscular weakness Neurological ROS: as noted in HPI Dermatological ROS: negative for rash and skin lesion changes   Blood pressure 129/73, pulse 96, temperature 98.2 F (36.8 C), temperature source Oral, resp. rate 24, height 5\' 5"  (1.651 m), weight 81.6 kg (180 lb), SpO2 99 %.   Neurologic Examination:                                                                                                      HEENT-  Normocephalic, no lesions, without obvious abnormality.  Normal external eye and conjunctiva.  Normal TM's bilaterally.  Normal auditory canals and external ears. Normal external nose, mucus membranes and septum.  Normal pharynx. Cardiovascular- regular rate and rhythm, S1, S2 normal, no murmur, click, rub or gallop, pulses palpable throughout   Lungs- chest clear, no wheezing, rales, normal symmetric air entry, Heart exam - S1, S2 normal, no murmur, no gallop, rate regular Abdomen-  soft, non-tender; bowel sounds normal; no masses,  no organomegaly   Neurological Examination Mental Status: Awake, eyes open, aphasic, not following commands Localizes appropriately in all ext Moves all ext Grimaces to nox stim Tracks around room PERRL EOMI      Lab Results: Basic Metabolic Panel:  Recent Labs Lab 12/01/16 1336  NA 139  K 4.3  CL 109  CO2 25  GLUCOSE 152*  BUN 22*  CREATININE 2.03*  CALCIUM 9.0    Liver Function Tests:  Recent Labs Lab 12/01/16 1336  AST 18  ALT 15*  ALKPHOS 41  BILITOT 0.6  PROT 7.2  ALBUMIN 3.4*   No results for input(s): LIPASE, AMYLASE in the last 168 hours.  Recent Labs Lab 12/01/16 1334  AMMONIA 21    CBC:  Recent Labs Lab 12/01/16 1336  WBC 5.1  NEUTROABS 3.7  HGB 11.6*  HCT 32.7*  MCV 80.1  PLT 171    Cardiac Enzymes: No results for input(s): CKTOTAL, CKMB, CKMBINDEX, TROPONINI in the last 168 hours.  Lipid Panel: No results for input(s): CHOL, TRIG, HDL, CHOLHDL, VLDL, LDLCALC in the last 168 hours.  CBG: No results for input(s): GLUCAP  in the last 168 hours.  Microbiology: No results found for this or any previous visit.  Coagulation Studies: No results for input(s): LABPROT, INR in the last 72 hours.  Imaging: Dg Chest 1 View  Result Date: 12/01/2016 CLINICAL DATA:  Pt came in with confusion, that started today, he does not recall any injury, no chest complaints, came from ct, non smoker EXAM: CHEST 1 VIEW COMPARISON:  None. FINDINGS: Normal mediastinum and cardiac silhouette. Normal pulmonary vasculature. No evidence of effusion, infiltrate, or pneumothorax. No acute bony abnormality. IMPRESSION: No acute cardiopulmonary process. Electronically Signed   By: Suzy Bouchard M.D.   On: 12/01/2016 14:03   Ct Head Wo Contrast  Result Date: 12/01/2016 CLINICAL DATA:  Confusion EXAM: CT HEAD WITHOUT CONTRAST TECHNIQUE: Contiguous axial images were obtained from the base of the skull through  the vertex without intravenous contrast. COMPARISON:  MRI 02/23/2005 FINDINGS: Brain: No acute territorial infarction or intracranial hemorrhage is visualized. Mildly enlarged ventricles since the prior exam. No significant atrophy. No focal mass, midline shift or mass effect. Vascular: No hyperdense vessels. Mild carotid artery calcifications. Skull: Mastoids are clear.  No skull fracture Sinuses/Orbits: Mucosal thickening within the ethmoids and maxillary sinuses with small fluid levels in the maxillary sinuses. No acute orbital abnormality. Other: None IMPRESSION: 1. No CT evidence for intracranial hemorrhage or large vessel territorial infarct or intracranial mass 2. Ventricles are slightly enlarged since the prior exam. Mild atrophy is present, however ventricular size appears somewhat disproportionate to atrophy, this raises the possibility of normal pressure hydrocephalus. Clinical correlation is required. Electronically Signed   By: Donavan Foil M.D.   On: 12/01/2016 14:06         Assessment/Plan:  76 year old male with a past medical history of cancer, depression, agent orange exposure, hyperlipidemia, PTSD who presents today with altered mental status. EMS reports this started approximately 2-3 days ago. Wife had stated to EMS that when he was told to put on a new underwear he was trying to put the underwear on over his jeans. Currently patient denies any headache, chest pain, shortness of breath, nausea, vomiting, diarrhea. States he has not had any recent fevers or chills. Home meds include Seroquel, Zoloft, trazodone, prazosin, tamsulosin, vitamin D.  Had a witnessed seizure in the ER, given 2mg  ativan  Currently has not had any more events  First time seizure  Rec's  - Continuous EEG - MRI brain with and without contrast - May be related to poly pharmacy, trazadone? - CTH- NPH?, as per family has been having some memory and gait issues that are progressive over the past months,  not sure if this is related to his seizure - Infectious workup, may need LP? Depending on the above findings - LFT's -Ammonia - TSH

## 2016-12-01 NOTE — ED Notes (Signed)
Pt resting, no further signs of seizure activity.  Family has remained at bedside.

## 2016-12-02 DIAGNOSIS — G4089 Other seizures: Secondary | ICD-10-CM | POA: Diagnosis present

## 2016-12-02 DIAGNOSIS — R410 Disorientation, unspecified: Secondary | ICD-10-CM | POA: Diagnosis not present

## 2016-12-02 DIAGNOSIS — B003 Herpesviral meningitis: Secondary | ICD-10-CM | POA: Diagnosis present

## 2016-12-02 DIAGNOSIS — Z79899 Other long term (current) drug therapy: Secondary | ICD-10-CM | POA: Diagnosis not present

## 2016-12-02 DIAGNOSIS — R338 Other retention of urine: Secondary | ICD-10-CM | POA: Diagnosis present

## 2016-12-02 DIAGNOSIS — Z8546 Personal history of malignant neoplasm of prostate: Secondary | ICD-10-CM | POA: Diagnosis not present

## 2016-12-02 DIAGNOSIS — N179 Acute kidney failure, unspecified: Secondary | ICD-10-CM | POA: Diagnosis not present

## 2016-12-02 DIAGNOSIS — I471 Supraventricular tachycardia: Secondary | ICD-10-CM | POA: Diagnosis present

## 2016-12-02 DIAGNOSIS — G030 Nonpyogenic meningitis: Secondary | ICD-10-CM | POA: Diagnosis not present

## 2016-12-02 DIAGNOSIS — N178 Other acute kidney failure: Secondary | ICD-10-CM | POA: Diagnosis not present

## 2016-12-02 DIAGNOSIS — F4310 Post-traumatic stress disorder, unspecified: Secondary | ICD-10-CM | POA: Diagnosis present

## 2016-12-02 DIAGNOSIS — R739 Hyperglycemia, unspecified: Secondary | ICD-10-CM | POA: Diagnosis present

## 2016-12-02 DIAGNOSIS — G039 Meningitis, unspecified: Secondary | ICD-10-CM | POA: Diagnosis not present

## 2016-12-02 DIAGNOSIS — E784 Other hyperlipidemia: Secondary | ICD-10-CM | POA: Diagnosis not present

## 2016-12-02 DIAGNOSIS — R569 Unspecified convulsions: Secondary | ICD-10-CM | POA: Diagnosis present

## 2016-12-02 DIAGNOSIS — G9341 Metabolic encephalopathy: Secondary | ICD-10-CM | POA: Diagnosis present

## 2016-12-02 DIAGNOSIS — F329 Major depressive disorder, single episode, unspecified: Secondary | ICD-10-CM | POA: Diagnosis present

## 2016-12-02 DIAGNOSIS — N401 Enlarged prostate with lower urinary tract symptoms: Secondary | ICD-10-CM | POA: Diagnosis present

## 2016-12-02 DIAGNOSIS — E7800 Pure hypercholesterolemia, unspecified: Secondary | ICD-10-CM | POA: Diagnosis present

## 2016-12-02 DIAGNOSIS — G912 (Idiopathic) normal pressure hydrocephalus: Secondary | ICD-10-CM | POA: Diagnosis present

## 2016-12-02 DIAGNOSIS — G9340 Encephalopathy, unspecified: Secondary | ICD-10-CM | POA: Diagnosis present

## 2016-12-02 DIAGNOSIS — N183 Chronic kidney disease, stage 3 (moderate): Secondary | ICD-10-CM | POA: Diagnosis not present

## 2016-12-02 DIAGNOSIS — E869 Volume depletion, unspecified: Secondary | ICD-10-CM | POA: Diagnosis present

## 2016-12-02 DIAGNOSIS — E876 Hypokalemia: Secondary | ICD-10-CM | POA: Diagnosis not present

## 2016-12-02 DIAGNOSIS — G4700 Insomnia, unspecified: Secondary | ICD-10-CM | POA: Diagnosis present

## 2016-12-02 LAB — COMPREHENSIVE METABOLIC PANEL
ALBUMIN: 3.3 g/dL — AB (ref 3.5–5.0)
ALT: 16 U/L — ABNORMAL LOW (ref 17–63)
ANION GAP: 8 (ref 5–15)
AST: 25 U/L (ref 15–41)
Alkaline Phosphatase: 37 U/L — ABNORMAL LOW (ref 38–126)
BUN: 17 mg/dL (ref 6–20)
CHLORIDE: 110 mmol/L (ref 101–111)
CO2: 22 mmol/L (ref 22–32)
Calcium: 8.7 mg/dL — ABNORMAL LOW (ref 8.9–10.3)
Creatinine, Ser: 1.92 mg/dL — ABNORMAL HIGH (ref 0.61–1.24)
GFR calc Af Amer: 37 mL/min — ABNORMAL LOW (ref >60)
GFR calc non Af Amer: 32 mL/min — ABNORMAL LOW (ref >60)
GLUCOSE: 117 mg/dL — AB (ref 65–99)
POTASSIUM: 3.9 mmol/L (ref 3.5–5.1)
SODIUM: 140 mmol/L (ref 135–145)
TOTAL PROTEIN: 7 g/dL (ref 6.5–8.1)
Total Bilirubin: 0.9 mg/dL (ref 0.3–1.2)

## 2016-12-02 LAB — CBC
HCT: 30.8 % — ABNORMAL LOW (ref 39.0–52.0)
Hemoglobin: 11 g/dL — ABNORMAL LOW (ref 13.0–17.0)
MCH: 28.7 pg (ref 26.0–34.0)
MCHC: 35.7 g/dL (ref 30.0–36.0)
MCV: 80.4 fL (ref 78.0–100.0)
PLATELETS: 183 10*3/uL (ref 150–400)
RBC: 3.83 MIL/uL — ABNORMAL LOW (ref 4.22–5.81)
RDW: 13.9 % (ref 11.5–15.5)
WBC: 8.2 10*3/uL (ref 4.0–10.5)

## 2016-12-02 LAB — HEMOGLOBIN A1C
HEMOGLOBIN A1C: 5.5 % (ref 4.8–5.6)
MEAN PLASMA GLUCOSE: 111 mg/dL

## 2016-12-02 LAB — RPR: RPR Ser Ql: NONREACTIVE

## 2016-12-02 MED ORDER — DEXTROSE 5 % IV SOLN
2.0000 g | Freq: Two times a day (BID) | INTRAVENOUS | Status: DC
  Administered 2016-12-02 – 2016-12-06 (×10): 2 g via INTRAVENOUS
  Filled 2016-12-02 (×10): qty 2

## 2016-12-02 MED ORDER — VANCOMYCIN HCL IN DEXTROSE 1-5 GM/200ML-% IV SOLN
1000.0000 mg | INTRAVENOUS | Status: DC
  Administered 2016-12-02 – 2016-12-05 (×5): 1000 mg via INTRAVENOUS
  Filled 2016-12-02 (×5): qty 200

## 2016-12-02 MED ORDER — SODIUM CHLORIDE 0.9 % IV SOLN
2.0000 g | Freq: Three times a day (TID) | INTRAVENOUS | Status: DC
  Administered 2016-12-02 – 2016-12-06 (×14): 2 g via INTRAVENOUS
  Filled 2016-12-02 (×15): qty 2000

## 2016-12-02 MED ORDER — POTASSIUM PHOSPHATES 15 MMOLE/5ML IV SOLN
20.0000 meq | Freq: Once | INTRAVENOUS | Status: AC
  Administered 2016-12-02: 20 meq via INTRAVENOUS
  Filled 2016-12-02: qty 4.55

## 2016-12-02 MED ORDER — DEXTROSE 5 % IV SOLN
800.0000 mg | Freq: Two times a day (BID) | INTRAVENOUS | Status: DC
  Administered 2016-12-02 – 2016-12-03 (×4): 800 mg via INTRAVENOUS
  Filled 2016-12-02 (×5): qty 16

## 2016-12-02 NOTE — Progress Notes (Signed)
Report given to [place], [place].  Patient had unremarkable night.  Was unable to do neuro checks, as he was not responding to commands. Has been on EEG machine all night, as well as Counsellor. Patient remains much the same as when he arrived.  I did speak to Lance Todd in [place] and she said she wasn't sure if the spinal tap would be at bedside. Either way I gave Lance Todd her name and number and she has the number here.  Bed alarm is on and safety maintained.

## 2016-12-02 NOTE — Procedures (Signed)
LTM-EEG Report  HISTORY: Continuous video-EEG monitoring performed for 76 year old with altered mental status. ACQUISITION: International 10-20 system for electrode placement; 18 channels with additional eyes linked to ipsilateral ears and EKG. Additional T1-T2 electrodes were used. Continuous video recording obtained.   EEG NUMBER:  MEDICATIONS:  Day 1: No AEDs  DAY #1: from [id number] 12/01/16 to 0730 12/02/16  BACKGROUND: An overall medium voltage continuous recording with some spontaneous variability and reactivity. Waking background consisted of a medium voltage 8-9 Hz posterior dominant rhythm bilaterally with sparse low voltage beta activity in the bilateral frontocentral regions and some medium voltage theta activity diffusely. Sleep was captured, however no clear sleep architecture was present.  There was nearly continuous medium voltage generalized 1-3hz  activity, maximal frontally, throughout the recording which was intermixed with the waking background.  EPILEPTIFORM/PERIODIC ACTIVITY: none SEIZURES: none EVENTS: none EKG: no significant arrhythmia  SUMMARY: This was a moderately abnormal continuous video EEG due to diffuse anterior maximal slow activity, indicative of a diffuse cerebral disturbance. This is a non-specific finding and can be seen in toxic-metabolic encephalopathies. There were no epileptiform discharges or seizures.

## 2016-12-02 NOTE — Progress Notes (Signed)
Patient arrived to floor at 2126, accompanied by wife.  Patient is somnolent, eyes shut and arms kept bent close to body. Room set up for seizure precaution, o2 and suction on wall as well as pads on bed.  EEG set up for patient overnight and I ordered [employer] Monitoring.  Patient armband verified, and IV intact with transparent dressing.  Placed on cardiac monitoring and called ccmd with a 2nd verifier. Spoke at length to patients wife. Bed alarm is on. Safety maintained. Will continue to monitor and follow MDs orders.

## 2016-12-02 NOTE — Progress Notes (Signed)
Pharmacy Antibiotic Note  Lance Todd is a 76 y.o. male admitted on 12/01/2016 with confusion.  Pharmacy has been consulted for Vancomycin/Acyclovir dosing for rule out meningitis. For IR-guided LP in the AM. Noted renal dysfunction.   Plan: -Vancomycin 1000 mg IV q24h -Ceftriaxone 2g IV q12h per MD -Ampicillin 2g IV q8h per MD -Acyclovir 10 mg/kg IV q12h -Trend WBC, temp, renal function -Drug levels as indicated  -F/U meningitis work-up  Height: 5\' 5"  (165.1 cm) Weight: 180 lb (81.6 kg) IBW/kg (Calculated) : 61.5  Temp (24hrs), Avg:98.6 F (37 C), Min:98.2 F (36.8 C), Max:98.9 F (37.2 C)   Recent Labs Lab 12/01/16 1336 12/01/16 1802  WBC 5.1  --   CREATININE 2.03*  --   LATICACIDVEN  --  1.7    Estimated Creatinine Clearance: 30 mL/min (by C-G formula based on SCr of 2.03 mg/dL (H)).    No Known Allergies   Lance Todd 12/02/2016 12:20 AM

## 2016-12-02 NOTE — Progress Notes (Signed)
Wrote order for two more IVs to be placed. Because of patient rigidity, I am unable to see a good vein.

## 2016-12-02 NOTE — Progress Notes (Signed)
LTM EEG discontinued per Dr. Cristobal Goldmann.

## 2016-12-02 NOTE — Consult Note (Signed)
NEURO HOSPITALIST PROGRESS NOTE      Reason for Consult: New onset seizure      HPI:                                                                                                                                          Lance Todd is an 76 year old male with a past medical history of cancer, depression, agent orange exposure, hyperlipidemia, PTSD who presents today with altered mental status. EMS reports this started approximately 2-3 days ago. Wife had stated to EMS that when he was told to put on a new underwear he was trying to put the underwear on over his jeans. Currently patient denies any headache, chest pain, shortness of breath, nausea, vomiting, diarrhea. States he has not had any recent fevers or chills. Home meds include Seroquel, Zoloft, trazodone, prazosin, tamsulosin, vitamin D.  Past Medical History:  Diagnosis Date  . Cancer determined by prostate biopsy ([place])   . Depression   . Hypercholesterolemia   . PTSD (post-traumatic stress disorder)     Past Surgical History:  Procedure Laterality Date  . PROSTATE SURGERY      Family History  Problem Relation Age of Onset  . Seizures Sister       Social History:  reports that he has never smoked. He does not have any smokeless tobacco history on file. He reports that he does not drink alcohol or use drugs.  No Known Allergies  MEDICATIONS:                                                                                                                     I have reviewed the patient's current medications.   ROS:  History obtained from chart review and the patient  General ROS: negative for - chills, fatigue, fever, night sweats, weight gain or weight loss Psychological ROS: negative for - behavioral disorder, hallucinations, memory difficulties, mood  swings or suicidal ideation Ophthalmic ROS: negative for - blurry vision, double vision, eye pain or loss of vision ENT ROS: negative for - epistaxis, nasal discharge, oral lesions, sore throat, tinnitus or vertigo Allergy and Immunology ROS: negative for - hives or itchy/watery eyes Hematological and Lymphatic ROS: negative for - bleeding problems, bruising or swollen lymph nodes Endocrine ROS: negative for - galactorrhea, hair pattern changes, polydipsia/polyuria or temperature intolerance Respiratory ROS: negative for - cough, hemoptysis, shortness of breath or wheezing Cardiovascular ROS: negative for - chest pain, dyspnea on exertion, edema or irregular heartbeat Gastrointestinal ROS: negative for - abdominal pain, diarrhea, hematemesis, nausea/vomiting or stool incontinence Genito-Urinary ROS: negative for - dysuria, hematuria, incontinence or urinary frequency/urgency Musculoskeletal ROS: negative for - joint swelling or muscular weakness Neurological ROS: as noted in HPI Dermatological ROS: negative for rash and skin lesion changes   Blood pressure (!) 129/45, pulse 72, temperature 99.1 F (37.3 C), temperature source Axillary, resp. rate 20, height 5' 7.5" (1.715 m), weight 77.6 kg (171 lb), SpO2 97 %.   Neurologic Examination:                                                                                                      HEENT-  Normocephalic, no lesions, without obvious abnormality.  Normal external eye and conjunctiva.  Normal TM's bilaterally.  Normal auditory canals and external ears. Normal external nose, mucus membranes and septum.  Normal pharynx. Cardiovascular- regular rate and rhythm, S1, S2 normal, no murmur, click, rub or gallop, pulses palpable throughout   Lungs- chest clear, no wheezing, rales, normal symmetric air entry, Heart exam - S1, S2 normal, no murmur, no gallop, rate regular Abdomen- soft, non-tender; bowel sounds normal; no masses,  no  organomegaly   Neurological Examination Mental Status: Awake, eyes open, aphasic, not following commands Less agitated today Was able to speak and say ouch and stop to nox stim Localizes appropriately in all ext Moves all ext Grimaces to nox stim Tracks around room PERRL EOMI      Lab Results: Basic Metabolic Panel:  Recent Labs Lab 12/01/16 1336 12/01/16 1802 12/02/16 0837  NA 139  --  140  K 4.3  --  3.9  CL 109  --  110  CO2 25  --  22  GLUCOSE 152*  --  117*  BUN 22*  --  17  CREATININE 2.03*  --  1.92*  CALCIUM 9.0  --  8.7*  MG  --  2.3  --   PHOS  --  1.6*  --     Liver Function Tests:  Recent Labs Lab 12/01/16 1336 12/02/16 0837  AST 18 25  ALT 15* 16*  ALKPHOS 41 37*  BILITOT 0.6 0.9  PROT 7.2 7.0  ALBUMIN 3.4* 3.3*   No results for input(s): LIPASE, AMYLASE in the last 168 hours.  Recent  Labs Lab 12/01/16 1334  AMMONIA 21    CBC:  Recent Labs Lab 12/01/16 1336 12/02/16 0837  WBC 5.1 8.2  NEUTROABS 3.7  --   HGB 11.6* 11.0*  HCT 32.7* 30.8*  MCV 80.1 80.4  PLT 171 183    Cardiac Enzymes:  Recent Labs Lab 12/01/16 1802  CKTOTAL 113  TROPONINI <0.03    Lipid Panel: No results for input(s): CHOL, TRIG, HDL, CHOLHDL, VLDL, LDLCALC in the last 168 hours.  CBG: No results for input(s): GLUCAP in the last 168 hours.  Microbiology: No results found for this or any previous visit.  Coagulation Studies: No results for input(s): LABPROT, INR in the last 72 hours.  Imaging: Dg Chest 1 View  Result Date: 12/01/2016 CLINICAL DATA:  Pt came in with confusion, that started today, he does not recall any injury, no chest complaints, came from ct, non smoker EXAM: CHEST 1 VIEW COMPARISON:  None. FINDINGS: Normal mediastinum and cardiac silhouette. Normal pulmonary vasculature. No evidence of effusion, infiltrate, or pneumothorax. No acute bony abnormality. IMPRESSION: No acute cardiopulmonary process. Electronically Signed   By:  Suzy Bouchard M.D.   On: 12/01/2016 14:03   Ct Head Wo Contrast  Result Date: 12/01/2016 CLINICAL DATA:  Confusion EXAM: CT HEAD WITHOUT CONTRAST TECHNIQUE: Contiguous axial images were obtained from the base of the skull through the vertex without intravenous contrast. COMPARISON:  MRI 02/23/2005 FINDINGS: Brain: No acute territorial infarction or intracranial hemorrhage is visualized. Mildly enlarged ventricles since the prior exam. No significant atrophy. No focal mass, midline shift or mass effect. Vascular: No hyperdense vessels. Mild carotid artery calcifications. Skull: Mastoids are clear.  No skull fracture Sinuses/Orbits: Mucosal thickening within the ethmoids and maxillary sinuses with small fluid levels in the maxillary sinuses. No acute orbital abnormality. Other: None IMPRESSION: 1. No CT evidence for intracranial hemorrhage or large vessel territorial infarct or intracranial mass 2. Ventricles are slightly enlarged since the prior exam. Mild atrophy is present, however ventricular size appears somewhat disproportionate to atrophy, this raises the possibility of normal pressure hydrocephalus. Clinical correlation is required. Electronically Signed   By: Donavan Foil M.D.   On: 12/01/2016 14:06   Mr Brain Wo Contrast  Result Date: 12/01/2016 CLINICAL DATA:  A few days of increasing and confusion, obtunded. Recent seizure. History of prostate cancer, seizures, hyperlipidemia. EXAM: MRI HEAD WITHOUT CONTRAST TECHNIQUE: Multiplanar, multiecho pulse sequences of the brain and surrounding structures were obtained without intravenous contrast. COMPARISON:  CT HEAD December 01, 2016 at 1341 hours and MRI of the head February 23, 2005 FINDINGS: Multiple sequences are moderate to severely motion degraded. BRAIN: No reduced diffusion to suggest acute ischemia. No susceptibility artifact to suggest hemorrhage. Moderate to severe ventriculomegaly, disproportionate sulcal effacement at the convexities. No  suspicious parenchymal signal, masses or mass effect. No abnormal extra-axial fluid collections. No extra-axial masses though, contrast enhanced sequences would be more sensitive. Limited assessment of the hippocampi due to motion. Generally symmetric size and signal. VASCULAR: Normal major intracranial vascular flow voids present at skull base. SKULL AND UPPER CERVICAL SPINE: No abnormal sellar expansion. No suspicious calvarial bone marrow signal. Craniocervical junction maintained. Cerebellar tonsils at but not below the foramen magnum. SINUSES/ORBITS: Paranasal sinus mucosal thickening. Mastoid air cells are well aerated. The included ocular globes and orbital contents are non-suspicious. OTHER: None. IMPRESSION: No acute intracranial process on this moderate to severely motion degraded examination. 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 Electronically Signed   By: Elon Alas  M.D.   On: 12/01/2016 20:59         Assessment/Plan:  76 year old male with a past medical history of cancer, depression, agent orange exposure, hyperlipidemia, PTSD who presents today with altered mental status. EMS reports this started approximately 2-3 days ago. Wife had stated to EMS that when he was told to put on a new underwear he was trying to put the underwear on over his jeans. Currently patient denies any headache, chest pain, shortness of breath, nausea, vomiting, diarrhea. States he has not had any recent fevers or chills. Home meds include Seroquel, Zoloft, trazodone, prazosin, tamsulosin, vitamin D.  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 Flomax?, holding both for now please - No fever overnight, WBC normal, LFT's and ammonia negative - Infectious  workup negative thus far. Started on empiric abx for meningitis and plan for LP this am via [place], agree with plan - Will continue to follow clinically

## 2016-12-02 NOTE — Progress Notes (Addendum)
PROGRESS NOTE                                                                                                                                                                                                             Patient Demographics:    Lance Todd, is a 76 y.o. male, DOB - Dec 16, 1940, [id number]  Admit date - 12/01/2016   Admitting Physician Waldemar Dickens, MD  Outpatient Primary MD for the patient is No primary care provider on file.  LOS - 0  Outpatient Specialists:none  Chief Complaint  Patient presents with  . Altered Mental Status       Brief Narrative   76 year old male with history of BPH/prostate cancer status post resection, depression, hyperlipidemia and PTSD presented with 2-3 days of increasing confusion. No recent fevers or chills. No change in his medications recently. In the ED was found to have a witnessed tonic clonic seizures and was given 2 mg IV Ativan. Admitted to hospitalist service.  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 Neurology consulted and Patient placed on continuous EEG monitoring. Given persistent encephalopathy he was started on empiric treatment for meningitis.    Subjective:   On continuous EEG. No witnessed seizure since admission. Restless in bed but minimally communicative or following commands   Assessment  & Plan :    Active Problems: Acute infectious versus metabolic encephalopathy Postictal versus acute meningitis. On empiric IV vancomycin, ampicillin, Rocephin and acyclovir. Ativan when necessary for seizures. Continue seizure precaution. Video EEG monitoring showed abnormal diffuse anterior slow activity suggestive of diffuse cerebral disturbance without epileptiform discharges or seizures. Neurology recommend LP under fluoroscopy. Polypharmacy is another concern (trazodone,  Seroquel and Zoloft: Holding all). Urine drug screen unremarkable. TSH, RPR ammonia normal. Thiamine level pending. Check HIV antibody.    Seizure ([place]) No clear etiology. MRI been unremarkable for seizure focus. Video EEG shows diffuse slowing without epileptiform activity.  Acute vs acute on chronic kidney injury UA unremarkable. Check urine lites. Monitor with gentle hydration. Avoid nephrotoxins. Per daughter he sees a nephrologist. She's going to bring his labs.    Depression/PTSD Holding home medications.  Prostate cancer with history of resection. Hold Flomax  Hypophosphatemia Replenish with kphos  ? 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 Should not cause  seizures. Will consult neurosurgery once acute symptoms resolve . Per daughter patient has shuffling gait for almost a year. patient also has been slowly confused over past 1 year as well.    Code Status : Full code  Family Communication  :  updated daughter on the phone. Daughter Santiago Glad in [place] is the primary point of contact [phone])  Disposition Plan  : Currently inpatient  Barriers For Discharge : Active symptoms  Consults  :  Neurology  Procedures  :  MRI brain EEG  DVT Prophylaxis  :  Subcutaneous heparin  Lab Results  Component Value Date   PLT 183 12/02/2016    Antibiotics  :    Anti-infectives    Start     Dose/Rate Route Frequency Ordered Stop   12/02/16 0015  ampicillin (OMNIPEN) 2 g in sodium chloride 0.9 % 50 mL IVPB     2 g 150 mL/hr over 20 Minutes Intravenous Every 8 hours 12/02/16 0002     12/02/16 0015  cefTRIAXone (ROCEPHIN) 2 g in dextrose 5 % 50 mL IVPB     2 g 100 mL/hr over 30 Minutes Intravenous Every 12 hours 12/02/16 0002     12/02/16 0015  acyclovir (ZOVIRAX) 800 mg in dextrose 5 % 150 mL IVPB     800 mg 166 mL/hr over 60 Minutes Intravenous Every 12 hours 12/02/16 0002     12/02/16 0015  vancomycin (VANCOCIN) IVPB 1000 mg/200 mL premix     1,000 mg 200 mL/hr over 60 Minutes  Intravenous Every 24 hours 12/02/16 0002          Objective:   Vitals:   12/01/16 2000 12/01/16 2157 12/02/16 0205 12/02/16 0518  BP: 136/87 114/81 (!) 138/52 (!) 129/45  Pulse: 94 75 77 72  Resp: 25 (!) 22 20 20   Temp:  98.9 F (37.2 C) 98.6 F (37 C) 99.1 F (37.3 C)  TempSrc:  Oral Oral Axillary  SpO2: 91% 99% 96% 97%  Weight:  77.6 kg (171 lb)    Height:  5' 7.5" (1.715 m)      Wt Readings from Last 3 Encounters:  12/01/16 77.6 kg (171 lb)     Intake/Output Summary (Last 24 hours) at 12/02/16 0919 Last data filed at 12/02/16 0600  Gross per 24 hour  Intake             1350 ml  Output              600 ml  Net              750 ml     Physical Exam  Gen: restless in bed HEENT: EEG leads, pupils reactive b/l, tongue bite, no pallor, moist mucosa, supple neck Chest: clear b/l, no added sounds CVS: N S1&S2, no murmurs, rubs or gallop GI: soft, NT, ND, BS+ Musculoskeletal: warm, no edema CNS: alert and awake, restless, minimally verbal, moaning. Moving all extremities.    Data Review:    CBC  Recent Labs Lab 12/01/16 1336 12/02/16 0837  WBC 5.1 8.2  HGB 11.6* 11.0*  HCT 32.7* 30.8*  PLT 171 183  MCV 80.1 80.4  MCH 28.4 28.7  MCHC 35.5 35.7  RDW 13.4 13.9  LYMPHSABS 1.0  --   MONOABS 0.3  --   EOSABS 0.1  --   BASOSABS 0.0  --     Chemistries   Recent Labs Lab 12/01/16 1336 12/01/16 1802  NA 139  --   K  4.3  --   CL 109  --   CO2 25  --   GLUCOSE 152*  --   BUN 22*  --   CREATININE 2.03*  --   CALCIUM 9.0  --   MG  --  2.3  AST 18  --   ALT 15*  --   ALKPHOS 41  --   BILITOT 0.6  --    ------------------------------------------------------------------------------------------------------------------ No results for input(s): CHOL, HDL, LDLCALC, TRIG, CHOLHDL, LDLDIRECT in the last 72 hours.  Lab Results  Component Value Date   HGBA1C 5.5 12/01/2016    ------------------------------------------------------------------------------------------------------------------  Recent Labs  12/01/16 1336  TSH 1.050   ------------------------------------------------------------------------------------------------------------------  Recent Labs  12/01/16 1802  FOLATE 30.9    Coagulation profile No results for input(s): INR, PROTIME in the last 168 hours.  No results for input(s): DDIMER in the last 72 hours.  Cardiac Enzymes  Recent Labs Lab 12/01/16 1802  TROPONINI <0.03   ------------------------------------------------------------------------------------------------------------------ No results found for: BNP  Inpatient Medications  Scheduled Meds: . acyclovir  800 mg Intravenous Q12H  . ampicillin (OMNIPEN) IV  2 g Intravenous Q8H  . cefTRIAXone (ROCEPHIN)  IV  2 g Intravenous Q12H  . heparin  5,000 Units Subcutaneous Q8H  . omega-3 acid ethyl esters  1 g Oral Daily  . potassium phosphate IVPB (mEq)  20 mEq Intravenous Once  . pravastatin  10 mg Oral QHS  . QUEtiapine  100 mg Oral QHS  . sertraline  100 mg Oral Daily  . sodium chloride flush  3 mL Intravenous Q12H  . vancomycin  1,000 mg Intravenous Q24H   Continuous Infusions: . sodium chloride 1,000 mL (12/01/16 1816)   PRN Meds:.acetaminophen **OR** acetaminophen, LORazepam, ondansetron, promethazine, traZODone  Micro Results No results found for this or any previous visit (from the past 240 hour(s)).  Radiology Reports Dg Chest 1 View  Result Date: 12/01/2016 CLINICAL DATA:  Pt came in with confusion, that started today, he does not recall any injury, no chest complaints, came from ct, non smoker EXAM: CHEST 1 VIEW COMPARISON:  None. FINDINGS: Normal mediastinum and cardiac silhouette. Normal pulmonary vasculature. No evidence of effusion, infiltrate, or pneumothorax. No acute bony abnormality. IMPRESSION: No acute cardiopulmonary process. Electronically Signed    By: Suzy Bouchard M.D.   On: 12/01/2016 14:03   Ct Head Wo Contrast  Result Date: 12/01/2016 CLINICAL DATA:  Confusion EXAM: CT HEAD WITHOUT CONTRAST TECHNIQUE: Contiguous axial images were obtained from the base of the skull through the vertex without intravenous contrast. COMPARISON:  MRI 02/23/2005 FINDINGS: Brain: No acute territorial infarction or intracranial hemorrhage is visualized. Mildly enlarged ventricles since the prior exam. No significant atrophy. No focal mass, midline shift or mass effect. Vascular: No hyperdense vessels. Mild carotid artery calcifications. Skull: Mastoids are clear.  No skull fracture Sinuses/Orbits: Mucosal thickening within the ethmoids and maxillary sinuses with small fluid levels in the maxillary sinuses. No acute orbital abnormality. Other: None IMPRESSION: 1. No CT evidence for intracranial hemorrhage or large vessel territorial infarct or intracranial mass 2. Ventricles are slightly enlarged since the prior exam. Mild atrophy is present, however ventricular size appears somewhat disproportionate to atrophy, this raises the possibility of normal pressure hydrocephalus. Clinical correlation is required. Electronically Signed   By: Donavan Foil M.D.   On: 12/01/2016 14:06   Mr Brain Wo Contrast  Result Date: 12/01/2016 CLINICAL DATA:  A few days of increasing and confusion, obtunded. Recent seizure. History of prostate cancer, seizures, hyperlipidemia.  EXAM: MRI HEAD WITHOUT CONTRAST TECHNIQUE: Multiplanar, multiecho pulse sequences of the brain and surrounding structures were obtained without intravenous contrast. COMPARISON:  CT HEAD December 01, 2016 at 1341 hours and MRI of the head February 23, 2005 FINDINGS: Multiple sequences are moderate to severely motion degraded. BRAIN: No reduced diffusion to suggest acute ischemia. No susceptibility artifact to suggest hemorrhage. Moderate to severe ventriculomegaly, disproportionate sulcal effacement at the convexities. No  suspicious parenchymal signal, masses or mass effect. No abnormal extra-axial fluid collections. No extra-axial masses though, contrast enhanced sequences would be more sensitive. Limited assessment of the hippocampi due to motion. Generally symmetric size and signal. VASCULAR: Normal major intracranial vascular flow voids present at skull base. SKULL AND UPPER CERVICAL SPINE: No abnormal sellar expansion. No suspicious calvarial bone marrow signal. Craniocervical junction maintained. Cerebellar tonsils at but not below the foramen magnum. SINUSES/ORBITS: Paranasal sinus mucosal thickening. Mastoid air cells are well aerated. The included ocular globes and orbital contents are non-suspicious. OTHER: None. IMPRESSION: No acute intracranial process on this moderate to severely motion degraded examination. 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 Electronically Signed   By: Elon Alas M.D.   On: 12/01/2016 20:59    Time Spent in minutes  35   Louellen Molder M.D on 12/02/2016 at 9:19 AM  Between 7am to 7pm - Pager - [phone]  After 7pm go to [email or web address] - password [facility]  [facility] -  Office  [phone]

## 2016-12-03 ENCOUNTER — Inpatient Hospital Stay (HOSPITAL_COMMUNITY): Payer: Medicare Other

## 2016-12-03 LAB — BASIC METABOLIC PANEL
ANION GAP: 9 (ref 5–15)
BUN: 14 mg/dL (ref 6–20)
CHLORIDE: 110 mmol/L (ref 101–111)
CO2: 21 mmol/L — ABNORMAL LOW (ref 22–32)
Calcium: 8.2 mg/dL — ABNORMAL LOW (ref 8.9–10.3)
Creatinine, Ser: 1.67 mg/dL — ABNORMAL HIGH (ref 0.61–1.24)
GFR, EST AFRICAN AMERICAN: 44 mL/min — AB (ref >60)
GFR, EST NON AFRICAN AMERICAN: 38 mL/min — AB (ref >60)
Glucose, Bld: 97 mg/dL (ref 65–99)
POTASSIUM: 3.7 mmol/L (ref 3.5–5.1)
Sodium: 140 mmol/L (ref 135–145)

## 2016-12-03 LAB — CSF CELL COUNT WITH DIFFERENTIAL
RBC Count, CSF: 30 /mm3 — ABNORMAL HIGH
Tube #: 3
WBC, CSF: 6 /mm3 — ABNORMAL HIGH (ref 0–5)

## 2016-12-03 LAB — GLUCOSE, CSF: GLUCOSE CSF: 54 mg/dL (ref 40–70)

## 2016-12-03 LAB — CRYPTOCOCCAL ANTIGEN, CSF: Crypto Ag: NEGATIVE

## 2016-12-03 LAB — PROTEIN, CSF: Total  Protein, CSF: 93 mg/dL — ABNORMAL HIGH (ref 15–45)

## 2016-12-03 MED ORDER — BLISTEX MEDICATED EX OINT
TOPICAL_OINTMENT | CUTANEOUS | Status: DC | PRN
  Filled 2016-12-03 (×2): qty 6.3

## 2016-12-03 MED ORDER — LORAZEPAM 2 MG/ML IJ SOLN
1.0000 mg | INTRAMUSCULAR | Status: DC | PRN
  Administered 2016-12-03: 1 mg via INTRAVENOUS
  Administered 2016-12-03 – 2016-12-07 (×6): 2 mg via INTRAVENOUS
  Filled 2016-12-03 (×7): qty 1

## 2016-12-03 MED ORDER — LIDOCAINE HCL (PF) 1 % IJ SOLN
5.0000 mL | Freq: Once | INTRAMUSCULAR | Status: AC
  Administered 2016-12-03: 5 mL via INTRADERMAL
  Filled 2016-12-03: qty 5

## 2016-12-03 MED ORDER — LORAZEPAM 2 MG/ML IJ SOLN
1.0000 mg | Freq: Once | INTRAMUSCULAR | Status: AC
  Administered 2016-12-03: 1 mg via INTRAVENOUS
  Filled 2016-12-03: qty 1

## 2016-12-03 MED ORDER — DEXTROSE 5 % IV SOLN
800.0000 mg | Freq: Two times a day (BID) | INTRAVENOUS | Status: DC
  Administered 2016-12-04 – 2016-12-08 (×10): 800 mg via INTRAVENOUS
  Filled 2016-12-03 (×11): qty 16

## 2016-12-03 MED ORDER — LIDOCAINE HCL (PF) 1 % IJ SOLN
2.0000 mL | Freq: Once | INTRAMUSCULAR | Status: AC
  Administered 2016-12-03: 4 mL via INTRADERMAL

## 2016-12-03 NOTE — Progress Notes (Signed)
PROGRESS NOTE                                                                                                                                                                                                             Patient Demographics:    Lance Todd, is a 76 y.o. male, DOB - [birth year], [id number]  Admit date - 12/01/2016   Admitting Physician Waldemar Dickens, MD  Outpatient Primary MD for the patient is No primary care provider on file.  LOS - 1  Outpatient Specialists:none  Chief Complaint  Patient presents with  . Altered Mental Status       Brief Narrative   76 year old male with history of BPH/prostate cancer status post resection, depression, hyperlipidemia and PTSD presented with 2-3 days of increasing confusion. No recent fevers or chills. No change in his medications recently. In the ED was found to have a witnessed tonic clonic seizures and was given 2 mg IV Ativan. Admitted to hospitalist service.  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 Neurology consulted and Patient placed on continuous EEG monitoring. Given persistent encephalopathy he was started on empiric treatment for meningitis.    Subjective:   Appears much oriented today and answering questions. Afebrile and no further seizure activity.   Assessment  & Plan :    Active Problems: Acute infectious versus metabolic encephalopathy Postictal versus acute meningitis. Empiric antibiotics until CSF results obtained. Ativan when necessary for seizures. Continue seizure precaution. Video EEG monitoring 80 for epileptiform activity but shows diffuse slowing. Polypharmacy is another concern (trazodone, Seroquel and Zoloft: Holding all). Urine drug screen unremarkable. TSH, RPR ammonia normal. Thiamine level pending. Check HIV antibody.    Seizure ([place]) No clear  etiology. MRI been unremarkable for seizure focus. Video EEG shows diffuse slowing without epileptiform activity. When necessary Ativan and seizure precaution..  Acute vs acute on chronic kidney injury UA unremarkable. Improving with hydration. As per daughter patient sees a nephrologist and was going to bring his previous labs.    Depression/PTSD Holding home medications.  Prostate cancer with history of resection. Holding  Flomax  Hypophosphatemia Replenish with kphos  ? 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 Should not cause seizures. Will consult neurosurgery once acute symptoms resolve . Per daughter patient has shuffling gait  for almost a year. patient also has been slowly confused over past 1 year as well.    Code Status : Full code  Family Communication  :  None at bedside. Daughter Santiago Glad in [place] is the primary point of contact [phone])  Disposition Plan  : Currently inpatient  Barriers For Discharge : Active symptoms  Consults  :  Neurology  Procedures  :  MRI brain EEG  DVT Prophylaxis  :  Subcutaneous heparin  Lab Results  Component Value Date   PLT 183 12/02/2016    Antibiotics  :    Anti-infectives    Start     Dose/Rate Route Frequency Ordered Stop   12/02/16 0015  ampicillin (OMNIPEN) 2 g in sodium chloride 0.9 % 50 mL IVPB     2 g 150 mL/hr over 20 Minutes Intravenous Every 8 hours 12/02/16 0002     12/02/16 0015  cefTRIAXone (ROCEPHIN) 2 g in dextrose 5 % 50 mL IVPB     2 g 100 mL/hr over 30 Minutes Intravenous Every 12 hours 12/02/16 0002     12/02/16 0015  acyclovir (ZOVIRAX) 800 mg in dextrose 5 % 150 mL IVPB     800 mg 166 mL/hr over 60 Minutes Intravenous Every 12 hours 12/02/16 0002     12/02/16 0015  vancomycin (VANCOCIN) IVPB 1000 mg/200 mL premix     1,000 mg 200 mL/hr over 60 Minutes Intravenous Every 24 hours 12/02/16 0002          Objective:   Vitals:   12/02/16 0518 12/02/16 1337 12/02/16 2249 12/03/16 0622  BP: (!) 129/45 (!)  126/49 127/65 (!) 130/48  Pulse: 72 66 (!) 59 63  Resp: 20 20 17 17   Temp: 99.1 F (37.3 C) 97.3 F (36.3 C) 99.3 F (37.4 C) 99 F (37.2 C)  TempSrc: Axillary Oral Axillary Axillary  SpO2: 97% 100% 100% 100%  Weight:      Height:        Wt Readings from Last 3 Encounters:  12/01/16 77.6 kg (171 lb)     Intake/Output Summary (Last 24 hours) at 12/03/16 1430 Last data filed at 12/03/16 0644  Gross per 24 hour  Intake             1295 ml  Output              850 ml  Net              445 ml     Physical Exam  Gen: Not in distress, more awake and alert HEENT:  moist mucosa, supple neck Chest: clear b/l, no added sounds CVS: N S1&S2, no murmurs, GI: soft, NT, ND, BS+ Musculoskeletal: warm, no edema CNS: alert and awake, oriented 2    Data Review:    CBC  Recent Labs Lab 12/01/16 1336 12/02/16 0837  WBC 5.1 8.2  HGB 11.6* 11.0*  HCT 32.7* 30.8*  PLT 171 183  MCV 80.1 80.4  MCH 28.4 28.7  MCHC 35.5 35.7  RDW 13.4 13.9  LYMPHSABS 1.0  --   MONOABS 0.3  --   EOSABS 0.1  --   BASOSABS 0.0  --     Chemistries   Recent Labs Lab 12/01/16 1336 12/01/16 1802 12/02/16 0837 12/03/16 0806  NA 139  --  140 140  K 4.3  --  3.9 3.7  CL 109  --  110 110  CO2 25  --  22 21*  GLUCOSE 152*  --  117* 97  BUN 22*  --  17 14  CREATININE 2.03*  --  1.92* 1.67*  CALCIUM 9.0  --  8.7* 8.2*  MG  --  2.3  --   --   AST 18  --  25  --   ALT 15*  --  16*  --   ALKPHOS 41  --  37*  --   BILITOT 0.6  --  0.9  --    ------------------------------------------------------------------------------------------------------------------ No results for input(s): CHOL, HDL, LDLCALC, TRIG, CHOLHDL, LDLDIRECT in the last 72 hours.  Lab Results  Component Value Date   HGBA1C 5.5 12/01/2016   ------------------------------------------------------------------------------------------------------------------  Recent Labs  12/01/16 1336  TSH 1.050    ------------------------------------------------------------------------------------------------------------------  Recent Labs  12/01/16 1802  FOLATE 30.9    Coagulation profile No results for input(s): INR, PROTIME in the last 168 hours.  No results for input(s): DDIMER in the last 72 hours.  Cardiac Enzymes  Recent Labs Lab 12/01/16 1802  TROPONINI <0.03   ------------------------------------------------------------------------------------------------------------------ No results found for: BNP  Inpatient Medications  Scheduled Meds: . acyclovir  800 mg Intravenous Q12H  . ampicillin (OMNIPEN) IV  2 g Intravenous Q8H  . cefTRIAXone (ROCEPHIN)  IV  2 g Intravenous Q12H  . lidocaine (PF)  5 mL Intradermal Once  . omega-3 acid ethyl esters  1 g Oral Daily  . pravastatin  10 mg Oral QHS  . sodium chloride flush  3 mL Intravenous Q12H  . vancomycin  1,000 mg Intravenous Q24H   Continuous Infusions: . sodium chloride 1,000 mL (12/03/16 0546)   PRN Meds:.acetaminophen **OR** acetaminophen, LORazepam, ondansetron, promethazine  Micro Results Recent Results (from the past 240 hour(s))  CSF culture     Status: None (Preliminary result)   Collection Time: 12/03/16 12:19 PM  Result Value Ref Range Status   Specimen Description CSF  Final   Special Requests TUBE 1  Final   Gram Stain   Final    WBC PRESENT, PREDOMINANTLY MONONUCLEAR NO ORGANISMS SEEN CYTOSPIN SMEAR    Culture PENDING  Incomplete   Report Status PENDING  Incomplete    Radiology Reports Dg Chest 1 View  Result Date: 12/01/2016 CLINICAL DATA:  Pt came in with confusion, that started today, he does not recall any injury, no chest complaints, came from ct, non smoker EXAM: CHEST 1 VIEW COMPARISON:  None. FINDINGS: Normal mediastinum and cardiac silhouette. Normal pulmonary vasculature. No evidence of effusion, infiltrate, or pneumothorax. No acute bony abnormality. IMPRESSION: No acute cardiopulmonary  process. Electronically Signed   By: Suzy Bouchard M.D.   On: 12/01/2016 14:03   Ct Head Wo Contrast  Result Date: 12/01/2016 CLINICAL DATA:  Confusion EXAM: CT HEAD WITHOUT CONTRAST TECHNIQUE: Contiguous axial images were obtained from the base of the skull through the vertex without intravenous contrast. COMPARISON:  MRI 02/23/2005 FINDINGS: Brain: No acute territorial infarction or intracranial hemorrhage is visualized. Mildly enlarged ventricles since the prior exam. No significant atrophy. No focal mass, midline shift or mass effect. Vascular: No hyperdense vessels. Mild carotid artery calcifications. Skull: Mastoids are clear.  No skull fracture Sinuses/Orbits: Mucosal thickening within the ethmoids and maxillary sinuses with small fluid levels in the maxillary sinuses. No acute orbital abnormality. Other: None IMPRESSION: 1. No CT evidence for intracranial hemorrhage or large vessel territorial infarct or intracranial mass 2. Ventricles are slightly enlarged since the prior exam. Mild atrophy is present, however ventricular size appears somewhat disproportionate to atrophy, this raises the possibility of normal pressure  hydrocephalus. Clinical correlation is required. Electronically Signed   By: Donavan Foil M.D.   On: 12/01/2016 14:06   Mr Brain Wo Contrast  Result Date: 12/01/2016 CLINICAL DATA:  A few days of increasing and confusion, obtunded. Recent seizure. History of prostate cancer, seizures, hyperlipidemia. EXAM: MRI HEAD WITHOUT CONTRAST TECHNIQUE: Multiplanar, multiecho pulse sequences of the brain and surrounding structures were obtained without intravenous contrast. COMPARISON:  CT HEAD December 01, 2016 at 1341 hours and MRI of the head February 23, 2005 FINDINGS: Multiple sequences are moderate to severely motion degraded. BRAIN: No reduced diffusion to suggest acute ischemia. No susceptibility artifact to suggest hemorrhage. Moderate to severe ventriculomegaly, disproportionate sulcal  effacement at the convexities. No suspicious parenchymal signal, masses or mass effect. No abnormal extra-axial fluid collections. No extra-axial masses though, contrast enhanced sequences would be more sensitive. Limited assessment of the hippocampi due to motion. Generally symmetric size and signal. VASCULAR: Normal major intracranial vascular flow voids present at skull base. SKULL AND UPPER CERVICAL SPINE: No abnormal sellar expansion. No suspicious calvarial bone marrow signal. Craniocervical junction maintained. Cerebellar tonsils at but not below the foramen magnum. SINUSES/ORBITS: Paranasal sinus mucosal thickening. Mastoid air cells are well aerated. The included ocular globes and orbital contents are non-suspicious. OTHER: None. IMPRESSION: No acute intracranial process on this moderate to severely motion degraded examination. 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 Electronically Signed   By: Elon Alas M.D.   On: 12/01/2016 20:59   Dg Fluoro Guide Lumbar Puncture  Result Date: 12/03/2016 Gilford Silvius, MD     12/03/2016 12:43 PM Successful LP at L5/S1.  Low volume CSF collection, see imaging report. JWatts MD   Time Spent in minutes  25   Louellen Molder M.D on 12/03/2016 at 2:30 PM  Between 7am to 7pm - Pager - [phone]  After 7pm go to [email or web address] - password [email or web address]  [facility] -  Office  [phone]

## 2016-12-03 NOTE — Procedures (Signed)
Successful LP at L5/S1.  Low volume CSF collection, see imaging report.  JWatts MD

## 2016-12-03 NOTE — Consult Note (Signed)
NEURO HOSPITALIST PROGRESS NOTE      Reason for Consult: New onset seizure      Subjective: Much improved this am, awake and following commands  Past Medical History:  Diagnosis Date  . Cancer determined by prostate biopsy ([place])   . Depression   . Hypercholesterolemia   . PTSD (post-traumatic stress disorder)     Past Surgical History:  Procedure Laterality Date  . PROSTATE SURGERY      Family History  Problem Relation Age of Onset  . Seizures Sister       Social History:  reports that he has never smoked. He does not have any smokeless tobacco history on file. He reports that he does not drink alcohol or use drugs.  No Known Allergies  MEDICATIONS:                                                                                                                     I have reviewed the patient's current medications.   ROS:                                                                                                                                       History obtained from chart review and the patient  General ROS: negative for - chills, fatigue, fever, night sweats, weight gain or weight loss Psychological ROS: negative for - behavioral disorder, hallucinations, memory difficulties, mood swings or suicidal ideation Ophthalmic ROS: negative for - blurry vision, double vision, eye pain or loss of vision ENT ROS: negative for - epistaxis, nasal discharge, oral lesions, sore throat, tinnitus or vertigo Allergy and Immunology ROS: negative for - hives or itchy/watery eyes Hematological and Lymphatic ROS: negative for - bleeding problems, bruising or swollen lymph nodes Endocrine ROS: negative for - galactorrhea, hair pattern changes, polydipsia/polyuria or temperature intolerance Respiratory ROS: negative for - cough, hemoptysis, shortness of breath or wheezing Cardiovascular ROS: negative for - chest pain, dyspnea on exertion, edema or irregular  heartbeat Gastrointestinal ROS: negative for - abdominal pain, diarrhea, hematemesis, nausea/vomiting or stool incontinence Genito-Urinary ROS: negative for - dysuria, hematuria, incontinence or urinary frequency/urgency Musculoskeletal ROS: negative for - joint swelling or muscular weakness Neurological ROS: as noted in HPI Dermatological ROS: negative for rash and skin lesion changes   Blood pressure Marland Kitchen)  130/48, pulse 63, temperature 99 F (37.2 C), temperature source Axillary, resp. rate 17, height 5' 7.5" (1.715 m), weight 77.6 kg (171 lb), SpO2 100 %.   Neurologic Examination:                                                                                                      HEENT-  Normocephalic, no lesions, without obvious abnormality.  Normal external eye and conjunctiva.  Normal TM's bilaterally.  Normal auditory canals and external ears. Normal external nose, mucus membranes and septum.  Normal pharynx. Cardiovascular- regular rate and rhythm, S1, S2 normal, no murmur, click, rub or gallop, pulses palpable throughout   Lungs- chest clear, no wheezing, rales, normal symmetric air entry, Heart exam - S1, S2 normal, no murmur, no gallop, rate regular Abdomen- soft, non-tender; bowel sounds normal; no masses,  no organomegaly   Neurological Examination Mental Status: Awake alert Ox3 Following all commands PERRL EOMI Motor- 4/5 in all ext Sensory- intact to light touch throughout      Lab Results: Basic Metabolic Panel:  Recent Labs Lab 12/01/16 1336 12/01/16 1802 12/02/16 0837 12/03/16 0806  NA 139  --  140 140  K 4.3  --  3.9 3.7  CL 109  --  110 110  CO2 25  --  22 21*  GLUCOSE 152*  --  117* 97  BUN 22*  --  17 14  CREATININE 2.03*  --  1.92* 1.67*  CALCIUM 9.0  --  8.7* 8.2*  MG  --  2.3  --   --   PHOS  --  1.6*  --   --     Liver Function Tests:  Recent Labs Lab 12/01/16 1336 12/02/16 0837  AST 18 25  ALT 15* 16*  ALKPHOS 41 37*  BILITOT 0.6  0.9  PROT 7.2 7.0  ALBUMIN 3.4* 3.3*   No results for input(s): LIPASE, AMYLASE in the last 168 hours.  Recent Labs Lab 12/01/16 1334  AMMONIA 21    CBC:  Recent Labs Lab 12/01/16 1336 12/02/16 0837  WBC 5.1 8.2  NEUTROABS 3.7  --   HGB 11.6* 11.0*  HCT 32.7* 30.8*  MCV 80.1 80.4  PLT 171 183    Cardiac Enzymes:  Recent Labs Lab 12/01/16 1802  CKTOTAL 113  TROPONINI <0.03    Lipid Panel: No results for input(s): CHOL, TRIG, HDL, CHOLHDL, VLDL, LDLCALC in the last 168 hours.  CBG: No results for input(s): GLUCAP in the last 168 hours.  Microbiology: No results found for this or any previous visit.  Coagulation Studies: No results for input(s): LABPROT, INR in the last 72 hours.  Imaging: Dg Chest 1 View  Result Date: 12/01/2016 CLINICAL DATA:  Pt came in with confusion, that started today, he does not recall any injury, no chest complaints, came from ct, non smoker EXAM: CHEST 1 VIEW COMPARISON:  None. FINDINGS: Normal mediastinum and cardiac silhouette. Normal pulmonary vasculature. No evidence of effusion, infiltrate, or pneumothorax. No acute bony abnormality. IMPRESSION: No acute cardiopulmonary process. Electronically Signed   By: Nicole Kindred  Leonia Reeves M.D.   On: 12/01/2016 14:03   Ct Head Wo Contrast  Result Date: 12/01/2016 CLINICAL DATA:  Confusion EXAM: CT HEAD WITHOUT CONTRAST TECHNIQUE: Contiguous axial images were obtained from the base of the skull through the vertex without intravenous contrast. COMPARISON:  MRI 02/23/2005 FINDINGS: Brain: No acute territorial infarction or intracranial hemorrhage is visualized. Mildly enlarged ventricles since the prior exam. No significant atrophy. No focal mass, midline shift or mass effect. Vascular: No hyperdense vessels. Mild carotid artery calcifications. Skull: Mastoids are clear.  No skull fracture Sinuses/Orbits: Mucosal thickening within the ethmoids and maxillary sinuses with small fluid levels in the maxillary  sinuses. No acute orbital abnormality. Other: None IMPRESSION: 1. No CT evidence for intracranial hemorrhage or large vessel territorial infarct or intracranial mass 2. Ventricles are slightly enlarged since the prior exam. Mild atrophy is present, however ventricular size appears somewhat disproportionate to atrophy, this raises the possibility of normal pressure hydrocephalus. Clinical correlation is required. Electronically Signed   By: Donavan Foil M.D.   On: 12/01/2016 14:06   Mr Brain Wo Contrast  Result Date: 12/01/2016 CLINICAL DATA:  A few days of increasing and confusion, obtunded. Recent seizure. History of prostate cancer, seizures, hyperlipidemia. EXAM: MRI HEAD WITHOUT CONTRAST TECHNIQUE: Multiplanar, multiecho pulse sequences of the brain and surrounding structures were obtained without intravenous contrast. COMPARISON:  CT HEAD December 01, 2016 at 1341 hours and MRI of the head February 23, 2005 FINDINGS: Multiple sequences are moderate to severely motion degraded. BRAIN: No reduced diffusion to suggest acute ischemia. No susceptibility artifact to suggest hemorrhage. Moderate to severe ventriculomegaly, disproportionate sulcal effacement at the convexities. No suspicious parenchymal signal, masses or mass effect. No abnormal extra-axial fluid collections. No extra-axial masses though, contrast enhanced sequences would be more sensitive. Limited assessment of the hippocampi due to motion. Generally symmetric size and signal. VASCULAR: Normal major intracranial vascular flow voids present at skull base. SKULL AND UPPER CERVICAL SPINE: No abnormal sellar expansion. No suspicious calvarial bone marrow signal. Craniocervical junction maintained. Cerebellar tonsils at but not below the foramen magnum. SINUSES/ORBITS: Paranasal sinus mucosal thickening. Mastoid air cells are well aerated. The included ocular globes and orbital contents are non-suspicious. OTHER: None. IMPRESSION: No acute intracranial  process on this moderate to severely motion degraded examination. 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 Electronically Signed   By: Elon Alas M.D.   On: 12/01/2016 20:59         Assessment/Plan:  76 year old male with a past medical history of cancer, depression, agent orange exposure, hyperlipidemia, PTSD who presents today with altered mental status. EMS reports this started approximately 2-3 days ago. Wife had stated to EMS that when he was told to put on a new underwear he was trying to put the underwear on over his jeans. Currently patient denies any headache, chest pain, shortness of breath, nausea, vomiting, diarrhea. States he has not had any recent fevers or chills. Home meds include Seroquel, Zoloft, trazodone, prazosin, tamsulosin, vitamin D.  Had a witnessed seizure in the ER, given 2mg  ativan  Currently has not had any more events  First time seizure  Rec's  - Exam much improved this am  - Awaiting LP this am  - Continue empiric meningitis abx for now  - Please make sure to send of HSV PCR in the CSF  - If LP shows aspetic meningitis, could be related to the NSAID's or other meds he was taking for his backpain  - Will follow up LP  results

## 2016-12-04 LAB — BASIC METABOLIC PANEL
ANION GAP: 8 (ref 5–15)
BUN: 10 mg/dL (ref 6–20)
CHLORIDE: 111 mmol/L (ref 101–111)
CO2: 23 mmol/L (ref 22–32)
Calcium: 8.3 mg/dL — ABNORMAL LOW (ref 8.9–10.3)
Creatinine, Ser: 1.63 mg/dL — ABNORMAL HIGH (ref 0.61–1.24)
GFR calc Af Amer: 45 mL/min — ABNORMAL LOW (ref >60)
GFR, EST NON AFRICAN AMERICAN: 39 mL/min — AB (ref >60)
GLUCOSE: 80 mg/dL (ref 65–99)
Potassium: 3.8 mmol/L (ref 3.5–5.1)
SODIUM: 142 mmol/L (ref 135–145)

## 2016-12-04 LAB — HIV ANTIBODY (ROUTINE TESTING W REFLEX): HIV SCREEN 4TH GENERATION: NONREACTIVE

## 2016-12-04 NOTE — Care Management Note (Addendum)
Case Management Note  Patient Details  Name: Lance Todd MRN: [id number] Date of Birth: 08/13/1941  Subjective/Objective:               Spoke with patient's son at bedside. He states he is from home with wife. Patient is weak, and not mentally at baseline. PT eval pending. Anticipate SNF rec. Family open to considering and understand difference in amount of PT patient would receive at SNF vs. Home.      Action/Plan:  PT eval pending, anticipate SNF over the weekend pending improvement. CSW updated.   Addendum: Patient to DC to SNF [facility] place today.  Expected Discharge Date:                  Expected Discharge Plan:  Skilled Nursing Facility  In-House Referral:  Clinical Social Work  Discharge planning Services  CM Consult  Post Acute Care Choice:    Choice offered to:  Adult Children  DME Arranged:    DME Agency:     HH Arranged:    HH Agency:     Status of Service:  In process, will continue to follow  If discussed at Long Length of Stay Meetings, dates discussed:    Additional Comments:  [name], RN 12/04/2016, 11:53 AM

## 2016-12-04 NOTE — NC FL2 (Signed)
[place] MEDICAID FL2 LEVEL OF CARE SCREENING TOOL     IDENTIFICATION  Patient Name: Lance Todd Birthdate: March 11, 1941 Sex: male Admission Date (Current Location): 12/01/2016  [facility] [place] and [place] Number:  Herbalist and Address:  [facility], [place] [address] [place], [place] [address]      Provider Number: [id number]  Attending Physician Name and Address:  Louellen Molder, MD  Relative Name and Phone Number:       Current Level of Care: Hospital Recommended Level of Care: [place] Prior Approval Number:    Date Approved/Denied:   PASRR Number:    Discharge Plan: SNF    Current Diagnoses: Patient Active Problem List   Diagnosis Date Noted  . Acute kidney injury ([place])   . Acute encephalopathy   . Meningitis   . Hypophosphatemia   . Altered mental status, unspecified 12/01/2016  . Seizure ([place]) 12/01/2016  . Renal insufficiency 12/01/2016  . Hyperglycemia 12/01/2016  . Depression 12/01/2016  . Prostate cancer ([place]) 12/01/2016  . HLD (hyperlipidemia) 12/01/2016  . Insomnia 12/01/2016  . Altered mental status     Orientation RESPIRATION BLADDER Height & Weight     Self, Time  Normal Incontinent, External catheter Weight: 171 lb (77.6 kg) Height:  5' 7.5" (171.5 cm)  BEHAVIORAL SYMPTOMS/MOOD NEUROLOGICAL BOWEL NUTRITION STATUS      Continent Diet (see DC summary)  AMBULATORY STATUS COMMUNICATION OF NEEDS Skin   Extensive Assist Verbally Normal                       Personal Care Assistance Level of Assistance  Bathing, Dressing Bathing Assistance: Maximum assistance   Dressing Assistance: Maximum assistance     Functional Limitations Info             SPECIAL CARE FACTORS FREQUENCY  PT (By licensed PT), OT (By licensed OT)     PT Frequency: 5/wk OT Frequency: 5/wk            Contractures      Additional Factors Info  Code Status, Allergies, Isolation Precautions Code Status  Info: FULL Allergies Info: NKA     Isolation Precautions Info: droplet     Current Medications (12/04/2016):  This is the current hospital active medication list Current Facility-Administered Medications  Medication Dose Route Frequency Provider Last Rate Last Dose  . 0.9 %  sodium chloride infusion   Intravenous Continuous Nishant Dhungel, MD 100 mL/hr at 12/04/16 0903    . acetaminophen (TYLENOL) tablet 650 mg  650 mg Oral Q6H PRN Waldemar Dickens, MD       Or  . acetaminophen (TYLENOL) suppository 650 mg  650 mg Rectal Q6H PRN Waldemar Dickens, MD      . acyclovir (ZOVIRAX) 800 mg in dextrose 5 % 150 mL IVPB  800 mg Intravenous Q12H Nishant Dhungel, MD   800 mg at 12/04/16 0904  . ampicillin (OMNIPEN) 2 g in sodium chloride 0.9 % 50 mL IVPB  2 g Intravenous Q8H Erenest Blank, RPH   2 g at 12/04/16 0641  . cefTRIAXone (ROCEPHIN) 2 g in dextrose 5 % 50 mL IVPB  2 g Intravenous Q12H Erenest Blank, RPH   2 g at 12/04/16 0904  . lip balm (BLISTEX) ointment   Topical PRN Nishant Dhungel, MD      . LORazepam (ATIVAN) injection 1-2 mg  1-2 mg Intravenous Q2H PRN Louellen Molder, MD   2  mg at 12/03/16 2108  . omega-3 acid ethyl esters (LOVAZA) capsule 1 g  1 g Oral Daily Waldemar Dickens, MD      . ondansetron [facility]) injection 4 mg  4 mg Intravenous Q6H PRN Gardiner Barefoot, NP      . pravastatin (PRAVACHOL) tablet 10 mg  10 mg Oral QHS Waldemar Dickens, MD      . promethazine (PHENERGAN) tablet 12.5 mg  12.5 mg Oral Q6H PRN Waldemar Dickens, MD      . sodium chloride flush (NS) 0.9 % injection 3 mL  3 mL Intravenous Q12H Waldemar Dickens, MD   3 mL at 12/04/16 0904  . vancomycin (VANCOCIN) IVPB 1000 mg/200 mL premix  1,000 mg Intravenous Q24H Erenest Blank, RPH   1,000 mg at 12/04/16 [id number]     Discharge Medications: Please see discharge summary for a list of discharge medications.  Relevant Imaging Results:  Relevant Lab Results:   Additional Information SS#: [id number]  Jorge Ny, LCSW

## 2016-12-04 NOTE — Progress Notes (Addendum)
PROGRESS NOTE                                                                                                                                                                                                             Patient Demographics:    Lance Todd, is a 76 y.o. male, DOB - Sep 04, 1941, [id number]  Admit date - 12/01/2016   Admitting Physician Waldemar Dickens, MD  Outpatient Primary MD for the patient is No primary care provider on file.  LOS - 2  Outpatient Specialists:none  Chief Complaint  Patient presents with  . Altered Mental Status       Brief Narrative   76 year old male with history of BPH/prostate cancer status post resection, depression, hyperlipidemia and PTSD presented with 2-3 days of increasing confusion. No recent fevers or chills. No change in his medications recently. In the ED was found to have a witnessed tonic clonic seizures and was given 2 mg IV Ativan. Admitted to hospitalist service.  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 Neurology consulted and Patient placed on continuous EEG monitoring. Given persistent encephalopathy he was started on empiric treatment for meningitis.    Subjective:   Still Some confusion but continues to improve. Afebrile   Assessment  & Plan :    Active Problems: Acute metabolic encephalopathy/aseptic meningitis with seizures Continue empiric antibiotics. CSF culture so far negative. Typical antigen negative. Elevated protein on CSF which could be associated with aseptic meningitis. No further seizure activity.  Video EEG monitoring 80 for epileptiform activity but shows diffuse slowing. Polypharmacy is another concern (trazodone, Seroquel and Zoloft: Holding all). Urine drug screen unremarkable. TSH, RPR, HIV antibody and ammonia normal. Thiamine level pending.     Seizure  ([place]) Secondary to septic meningitis.. Video EEG shows diffuse slowing without epileptiform activity. Discussed with neurology. Given single episode and possibly associated with meningitis does not need antiepileptic.Marland Kitchen When necessary Ativan and seizure precaution.Marland Kitchen  acute on chronic kidney injury UA unremarkable. Family reports patient has chronic kidney disease stage III. No baseline renal function known. Improving with renal function (I suspect he is at baseline). Follows at the [place].    Depression/PTSD Holding home medications.  Prostate cancer with history of resection. Holding  Flomax  Hypophosphatemia Replenished with K-Phos.  ?  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 Should not cause seizures. Per daughter patient has shuffling gait with slow confusion for past 1 year. Discussed with neurosurgery Dr. Trenton Gammon recommends outpatient follow-up with him in 3-4 weeks .Marland Kitchen   Code Status : Full code  Family Communication  :  Discussed with son at bedside and daughters on the phone in detail Daughter Santiago Glad in [place] is the primary point of contact [phone])  Disposition Plan  : Currently inpatient, PT evaluation. Possibly home in the next 48 hours  Barriers For Discharge : Active symptoms  Consults  :   Neurology Neurosurgery  Procedures  :  MRI brain EEG  DVT Prophylaxis  :  Subcutaneous heparin  Lab Results  Component Value Date   PLT 183 12/02/2016    Antibiotics  :    Anti-infectives    Start     Dose/Rate Route Frequency Ordered Stop   12/03/16 2200  acyclovir (ZOVIRAX) 800 mg in dextrose 5 % 150 mL IVPB     800 mg 166 mL/hr over 60 Minutes Intravenous Every 12 hours 12/03/16 1831     12/02/16 0015  ampicillin (OMNIPEN) 2 g in sodium chloride 0.9 % 50 mL IVPB     2 g 150 mL/hr over 20 Minutes Intravenous Every 8 hours 12/02/16 0002     12/02/16 0015  cefTRIAXone (ROCEPHIN) 2 g in dextrose 5 % 50 mL IVPB     2 g 100 mL/hr over 30 Minutes Intravenous Every 12 hours 12/02/16  0002     12/02/16 0015  acyclovir (ZOVIRAX) 800 mg in dextrose 5 % 150 mL IVPB  Status:  Discontinued     800 mg 166 mL/hr over 60 Minutes Intravenous Every 12 hours 12/02/16 0002 12/03/16 1807   12/02/16 0015  vancomycin (VANCOCIN) IVPB 1000 mg/200 mL premix     1,000 mg 200 mL/hr over 60 Minutes Intravenous Every 24 hours 12/02/16 0002          Objective:   Vitals:   12/03/16 1632 12/03/16 2311 12/04/16 0512 12/04/16 1351  BP: (!) 128/58 (!) 156/65 (!) 159/63 (!) 130/53  Pulse: 65 (!) 58 69 89  Resp: 16 18 17    Temp: 98.5 F (36.9 C) 98.1 F (36.7 C) 98.2 F (36.8 C) 98.5 F (36.9 C)  TempSrc: Oral Oral Oral Oral  SpO2: 97% 100% 100% 99%  Weight:      Height:        Wt Readings from Last 3 Encounters:  12/01/16 77.6 kg (171 lb)     Intake/Output Summary (Last 24 hours) at 12/04/16 1403 Last data filed at 12/04/16 1353  Gross per 24 hour  Intake          1468.33 ml  Output             2625 ml  Net         -1156.67 ml     Physical Exam  Gen: Reportedly was restless early in the morning and received at some Ativan overnight, appears better oriented today. HEENT:  moist mucosa, supple neck Chest: clear b/l, no added sounds CVS: N S1&S2, no murmurs, GI: soft, NT, ND,  Musculoskeletal: warm, no edema CNS: alert and awake, oriented 2    Data Review:    CBC  Recent Labs Lab 12/01/16 1336 12/02/16 0837  WBC 5.1 8.2  HGB 11.6* 11.0*  HCT 32.7* 30.8*  PLT 171 183  MCV 80.1 80.4  MCH 28.4 28.7  MCHC 35.5  35.7  RDW 13.4 13.9  LYMPHSABS 1.0  --   MONOABS 0.3  --   EOSABS 0.1  --   BASOSABS 0.0  --     Chemistries   Recent Labs Lab 12/01/16 1336 12/01/16 1802 12/02/16 0837 12/03/16 0806 12/04/16 0651  NA 139  --  140 140 142  K 4.3  --  3.9 3.7 3.8  CL 109  --  110 110 111  CO2 25  --  22 21* 23  GLUCOSE 152*  --  117* 97 80  BUN 22*  --  17 14 10   CREATININE 2.03*  --  1.92* 1.67* 1.63*  CALCIUM 9.0  --  8.7* 8.2* 8.3*  MG  --  2.3  --    --   --   AST 18  --  25  --   --   ALT 15*  --  16*  --   --   ALKPHOS 41  --  37*  --   --   BILITOT 0.6  --  0.9  --   --    ------------------------------------------------------------------------------------------------------------------ No results for input(s): CHOL, HDL, LDLCALC, TRIG, CHOLHDL, LDLDIRECT in the last 72 hours.  Lab Results  Component Value Date   HGBA1C 5.5 12/01/2016   ------------------------------------------------------------------------------------------------------------------ No results for input(s): TSH, T4TOTAL, T3FREE, THYROIDAB in the last 72 hours.  Invalid input(s): FREET3 ------------------------------------------------------------------------------------------------------------------  Recent Labs  12/01/16 1802  FOLATE 30.9    Coagulation profile No results for input(s): INR, PROTIME in the last 168 hours.  No results for input(s): DDIMER in the last 72 hours.  Cardiac Enzymes  Recent Labs Lab 12/01/16 1802  TROPONINI <0.03   ------------------------------------------------------------------------------------------------------------------ No results found for: BNP  Inpatient Medications  Scheduled Meds: . acyclovir  800 mg Intravenous Q12H  . ampicillin (OMNIPEN) IV  2 g Intravenous Q8H  . cefTRIAXone (ROCEPHIN)  IV  2 g Intravenous Q12H  . omega-3 acid ethyl esters  1 g Oral Daily  . pravastatin  10 mg Oral QHS  . sodium chloride flush  3 mL Intravenous Q12H  . vancomycin  1,000 mg Intravenous Q24H   Continuous Infusions: . sodium chloride 100 mL/hr at 12/04/16 0903   PRN Meds:.acetaminophen **OR** acetaminophen, lip balm, LORazepam, ondansetron, promethazine  Micro Results Recent Results (from the past 240 hour(s))  CSF culture     Status: None (Preliminary result)   Collection Time: 12/03/16 12:19 PM  Result Value Ref Range Status   Specimen Description CSF  Final   Special Requests TUBE 1  Final   Gram Stain    Final    WBC PRESENT, PREDOMINANTLY MONONUCLEAR NO ORGANISMS SEEN CYTOSPIN SMEAR    Culture NO GROWTH 1 DAY  Final   Report Status PENDING  Incomplete    Radiology Reports Dg Chest 1 View  Result Date: 12/01/2016 CLINICAL DATA:  Pt came in with confusion, that started today, he does not recall any injury, no chest complaints, came from ct, non smoker EXAM: CHEST 1 VIEW COMPARISON:  None. FINDINGS: Normal mediastinum and cardiac silhouette. Normal pulmonary vasculature. No evidence of effusion, infiltrate, or pneumothorax. No acute bony abnormality. IMPRESSION: No acute cardiopulmonary process. Electronically Signed   By: Suzy Bouchard M.D.   On: 12/01/2016 14:03   Ct Head Wo Contrast  Result Date: 12/01/2016 CLINICAL DATA:  Confusion EXAM: CT HEAD WITHOUT CONTRAST TECHNIQUE: Contiguous axial images were obtained from the base of the skull through the vertex without intravenous contrast. COMPARISON:  MRI 02/23/2005  FINDINGS: Brain: No acute territorial infarction or intracranial hemorrhage is visualized. Mildly enlarged ventricles since the prior exam. No significant atrophy. No focal mass, midline shift or mass effect. Vascular: No hyperdense vessels. Mild carotid artery calcifications. Skull: Mastoids are clear.  No skull fracture Sinuses/Orbits: Mucosal thickening within the ethmoids and maxillary sinuses with small fluid levels in the maxillary sinuses. No acute orbital abnormality. Other: None IMPRESSION: 1. No CT evidence for intracranial hemorrhage or large vessel territorial infarct or intracranial mass 2. Ventricles are slightly enlarged since the prior exam. Mild atrophy is present, however ventricular size appears somewhat disproportionate to atrophy, this raises the possibility of normal pressure hydrocephalus. Clinical correlation is required. Electronically Signed   By: Donavan Foil M.D.   On: 12/01/2016 14:06   Mr Brain Wo Contrast  Result Date: 12/01/2016 CLINICAL DATA:  A few  days of increasing and confusion, obtunded. Recent seizure. History of prostate cancer, seizures, hyperlipidemia. EXAM: MRI HEAD WITHOUT CONTRAST TECHNIQUE: Multiplanar, multiecho pulse sequences of the brain and surrounding structures were obtained without intravenous contrast. COMPARISON:  CT HEAD December 01, 2016 at 1341 hours and MRI of the head February 23, 2005 FINDINGS: Multiple sequences are moderate to severely motion degraded. BRAIN: No reduced diffusion to suggest acute ischemia. No susceptibility artifact to suggest hemorrhage. Moderate to severe ventriculomegaly, disproportionate sulcal effacement at the convexities. No suspicious parenchymal signal, masses or mass effect. No abnormal extra-axial fluid collections. No extra-axial masses though, contrast enhanced sequences would be more sensitive. Limited assessment of the hippocampi due to motion. Generally symmetric size and signal. VASCULAR: Normal major intracranial vascular flow voids present at skull base. SKULL AND UPPER CERVICAL SPINE: No abnormal sellar expansion. No suspicious calvarial bone marrow signal. Craniocervical junction maintained. Cerebellar tonsils at but not below the foramen magnum. SINUSES/ORBITS: Paranasal sinus mucosal thickening. Mastoid air cells are well aerated. The included ocular globes and orbital contents are non-suspicious. OTHER: None. IMPRESSION: No acute intracranial process on this moderate to severely motion degraded examination. 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 Electronically Signed   By: Elon Alas M.D.   On: 12/01/2016 20:59   Dg Fluoro Guide Lumbar Puncture  Result Date: 12/03/2016 Gilford Silvius, MD     12/03/2016 12:43 PM Successful LP at L5/S1.  Low volume CSF collection, see imaging report. JWatts MD   Time Spent in minutes  25   Louellen Molder M.D on 12/04/2016 at 2:03 PM  Between 7am to 7pm - Pager - [phone]  After 7pm go to [email or web address] - password  [facility]  [facility] -  Office  [phone]

## 2016-12-04 NOTE — Care Management Important Message (Signed)
Important Message  Patient Details  Name: Lance Todd MRN: [id number] Date of Birth: 12-17-1940   Medicare Important Message Given:  Yes    [name], RN 12/04/2016, 11:53 AM

## 2016-12-04 NOTE — Clinical Social Work Note (Signed)
Clinical Social Work Assessment  Patient Details  Name: Lance Todd MRN: [id number] Date of Birth: Dec 10, 1940  Date of referral:  12/04/16               Reason for consult:  Facility Placement                Permission sought to share information with:  Chartered certified accountant granted to share information::  Yes, Verbal Permission Granted  Name::     Engineer, civil (consulting)::  SNF  Relationship::  son  Contact Information:     Housing/Transportation Living arrangements for the past 2 months:  Single Family Home Source of Information:  Adult Children Patient Interpreter Needed:  None Criminal Activity/Legal Involvement Pertinent to Current Situation/Hospitalization:  No - Comment as needed Significant Relationships:  Adult Children, Spouse Lives with:  Spouse Do you feel safe going back to the place where you live?  No Need for family participation in patient care:  Yes (Comment) (decision making at this time)  Care giving concerns:  Pt lives with spouse- she is unable to care for pt if does not improve with mobility.   Social Worker assessment / plan: CSW spoke with son about possibility of SNF if needed.  Son understands pt would not be safe at home if doesn't improve.  Employment status:  Retired Nurse, adult PT Recommendations:  Not assessed at this time [place] / Referral to community resources:  [place]  Patient/Family's Response to care:  Agreeable to SNF if needed but wants to wait on PT to make final decisions- hopeful pt could return home.  Patient/Family's Understanding of and Emotional Response to Diagnosis, Current Treatment, and Prognosis:  No questions or concerns at this time- awaiting further work up.  Emotional Assessment Appearance:  Appears stated age Attitude/Demeanor/Rapport:  Lethargic, Unresponsive Affect (typically observed):  Unable to Assess Orientation:  Oriented to Self, Oriented to  Place Alcohol / Substance use:  Not Applicable Psych involvement (Current and /or in the community):  No (Comment)  Discharge Needs  Concerns to be addressed:  Care Coordination Readmission within the last 30 days:  No Current discharge risk:  Physical Impairment Barriers to Discharge:  Continued Medical Work up   Jorge Ny, LCSW 12/04/2016, 1:59 PM

## 2016-12-05 ENCOUNTER — Encounter (HOSPITAL_COMMUNITY): Payer: Self-pay | Admitting: *Deleted

## 2016-12-05 DIAGNOSIS — N178 Other acute kidney failure: Secondary | ICD-10-CM

## 2016-12-05 MED ORDER — LACOSAMIDE 50 MG PO TABS
50.0000 mg | ORAL_TABLET | Freq: Two times a day (BID) | ORAL | Status: DC
  Administered 2016-12-05 – 2016-12-08 (×7): 50 mg via ORAL
  Filled 2016-12-05 (×7): qty 1

## 2016-12-05 NOTE — Progress Notes (Signed)
Rehab Admissions Coordinator Note:  Patient was screened by Retta Diones for appropriateness for an Inpatient Acute Rehab Consult.  At this time, we are recommending Inpatient Rehab consult.  Retta Diones 12/05/2016, 12:42 PM  I can be reached at [phone].

## 2016-12-05 NOTE — Progress Notes (Addendum)
Subjective: Brief summary Pt presented with confusion on 12/01/16 and had generalized convulsion the ER witnessed. Due to persistent encephalopathy he had prolong EEG which showed diffuse slowing. MRI of the brain was done which showed ventriculomegaly and no other acute abnormalities. His mentation improved.  Today he denied any new complaints. Denied any headaches or changes to vision.  Exam: Vitals:   12/04/16 2148 12/05/16 0533  BP: (!) 162/68 (!) 134/53  Pulse: 66 (!) 50  Resp: 18 17  Temp: 97.7 F (36.5 C) 98.2 F (36.8 C)   Gen: In bed, NAD Resp: non-labored breathing, no acute distress Abd: soft, nt  Neuro: MS: Awake, alert and oriented to all spheres except missed the year and said it was 2017. CN:II-XII grossly intact Motor: 4/5 throughout. No drift. Sensory:Normal to light touch  Pertinent Labs: CSF analysis showed WBC of 6 and protein of 93 while glucose is normal 54.  Images. MRI was reviewed and there is no acute stroke or hemorrhage.  Impression: 76 years old male presented with confusion for few days and then had a seizure. Unclear etiology at this time of seizure. Polypharmacy seems to be a possible potential cause of seizure. I reviewed the chart and noted that RN had noted a tongue bit during the seizure. I think he is at higher risk of further seizures. -Recommend -Starting antiepileptics Vimpat 50mg  BID. Would not recommend using Keppra due to PTSD. Vimpat typically has less side effects and can be up titrated if needed.  CSF analysis is not consistent with acute infection. I think we can wait for cultures and if negative then consider stopping antibiotics. CSF HSV PCR was not sent and I called lab but unfortunately there wasn't enough CSF for analysis. I will order a serum HSV antibodies.   Seizure precautions discussed with patient in details. He drives therefore I have counseled him about driving restriction per [facility].

## 2016-12-05 NOTE — Progress Notes (Signed)
PROGRESS NOTE                                                                                                                                                                                                             Patient Demographics:    Lance Todd, is a 76 y.o. male, DOB - 02-11-1941, [id number]  Admit date - 12/01/2016   Admitting Physician Waldemar Dickens, MD  Outpatient Primary MD for the patient is No primary care provider on file.  LOS - 3  Outpatient Specialists:none  Chief Complaint  Patient presents with  . Altered Mental Status       Brief Narrative   76 year old male with history of BPH/prostate cancer status post resection, depression, hyperlipidemia and PTSD presented with 2-3 days of increasing confusion. No recent fevers or chills. No change in his medications recently. In the ED was found to have a witnessed tonic clonic seizures and was given 2 mg IV Ativan. Admitted to hospitalist service.  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 Neurology consulted and Patient placed on continuous EEG monitoring. Given persistent encephalopathy he was started on empiric treatment for meningitis.    Subjective:   Much oriented today.   Assessment  & Plan :    Active Problems: Acute metabolic encephalopathy/aseptic meningitis with seizures On empiric antibiotics. Cultures so far negative. Serum HSV antibody sent. Cryptococcal antigen negative. Elevated protein on CSF which could be due to aseptic meningitis. Mental status improving.     Seizure ([place]) No further seizure activity. Started on Vimpat by neurology. Patient instructed that he cannot drive until cleared by outpatient neurologist. Video EEG monitoring negative for seizure activity and shows diffuse slowing. Polypharmacy is another concern (trazodone, Seroquel and  Zoloft: Holding all). Urine drug screen unremarkable. TSH, RPR, HIV antibody and ammonia normal. Thiamine level pending.   acute on chronic kidney injury UA unremarkable. Family reports patient has chronic kidney disease stage III. No baseline renal function known. Improving with renal function (I suspect he is at baseline). Follows at the [place].    Depression/PTSD Holding home medications.  Prostate cancer with history of resection. Holding  Flomax  Hypophosphatemia Replenished with K-Phos.  ? 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 Should not cause seizures. Per daughter patient has shuffling gait  with slow confusion for past 1 year. Discussed with neurosurgery Dr. Trenton Gammon recommends outpatient follow-up with him in 3-4 weeks .Marland Kitchen  Family  insist to speak with him. Will consult again.  Code Status : Full code  Family Communication  :  Discussed at length with patient's son at bedside and daughters on the phone. (Dr. Santiago Glad is the primary contact. Phone [phone])  Disposition Plan  : PT recommends CIR. Consulted.  Barriers For Discharge : Active symptoms, CIR consult  Consults  :   Neurology Neurosurgery  Procedures  :  MRI brain EEG  DVT Prophylaxis  :  Subcutaneous heparin  Lab Results  Component Value Date   PLT 183 12/02/2016    Antibiotics  :    Anti-infectives    Start     Dose/Rate Route Frequency Ordered Stop   12/03/16 2200  acyclovir (ZOVIRAX) 800 mg in dextrose 5 % 150 mL IVPB     800 mg 166 mL/hr over 60 Minutes Intravenous Every 12 hours 12/03/16 1831     12/02/16 0015  ampicillin (OMNIPEN) 2 g in sodium chloride 0.9 % 50 mL IVPB     2 g 150 mL/hr over 20 Minutes Intravenous Every 8 hours 12/02/16 0002     12/02/16 0015  cefTRIAXone (ROCEPHIN) 2 g in dextrose 5 % 50 mL IVPB     2 g 100 mL/hr over 30 Minutes Intravenous Every 12 hours 12/02/16 0002     12/02/16 0015  acyclovir (ZOVIRAX) 800 mg in dextrose 5 % 150 mL IVPB  Status:  Discontinued     800 mg 166 mL/hr  over 60 Minutes Intravenous Every 12 hours 12/02/16 0002 12/03/16 1807   12/02/16 0015  vancomycin (VANCOCIN) IVPB 1000 mg/200 mL premix     1,000 mg 200 mL/hr over 60 Minutes Intravenous Every 24 hours 12/02/16 0002          Objective:   Vitals:   12/04/16 1351 12/04/16 2148 12/05/16 0533 12/05/16 1337  BP: (!) 130/53 (!) 162/68 (!) 134/53 (!) 155/69  Pulse: 89 66 (!) 50 91  Resp:  18 17 20   Temp: 98.5 F (36.9 C) 97.7 F (36.5 C) 98.2 F (36.8 C) 97.5 F (36.4 C)  TempSrc: Oral Oral Oral Oral  SpO2: 99% 100% 98% 100%  Weight:      Height:        Wt Readings from Last 3 Encounters:  12/01/16 77.6 kg (171 lb)     Intake/Output Summary (Last 24 hours) at 12/05/16 1342 Last data filed at 12/05/16 0953  Gross per 24 hour  Intake          1568.33 ml  Output             2550 ml  Net          -981.67 ml     Physical Exam  Gen: Not in distress, answering questions appropriately. HEENT:  moist mucosa, supple neck Chest: clear b/l, no added sounds CVS: N S1&S2, no murmurs, GI: soft, NT, ND,  Musculoskeletal: warm, no edema CNS: alert and awake, oriented 2-3    Data Review:    CBC  Recent Labs Lab 12/01/16 1336 12/02/16 0837  WBC 5.1 8.2  HGB 11.6* 11.0*  HCT 32.7* 30.8*  PLT 171 183  MCV 80.1 80.4  MCH 28.4 28.7  MCHC 35.5 35.7  RDW 13.4 13.9  LYMPHSABS 1.0  --   MONOABS 0.3  --   EOSABS 0.1  --   BASOSABS  0.0  --     Chemistries   Recent Labs Lab 12/01/16 1336 12/01/16 1802 12/02/16 0837 12/03/16 0806 12/04/16 0651  NA 139  --  140 140 142  K 4.3  --  3.9 3.7 3.8  CL 109  --  110 110 111  CO2 25  --  22 21* 23  GLUCOSE 152*  --  117* 97 80  BUN 22*  --  17 14 10   CREATININE 2.03*  --  1.92* 1.67* 1.63*  CALCIUM 9.0  --  8.7* 8.2* 8.3*  MG  --  2.3  --   --   --   AST 18  --  25  --   --   ALT 15*  --  16*  --   --   ALKPHOS 41  --  37*  --   --   BILITOT 0.6  --  0.9  --   --     ------------------------------------------------------------------------------------------------------------------ No results for input(s): CHOL, HDL, LDLCALC, TRIG, CHOLHDL, LDLDIRECT in the last 72 hours.  Lab Results  Component Value Date   HGBA1C 5.5 12/01/2016   ------------------------------------------------------------------------------------------------------------------ No results for input(s): TSH, T4TOTAL, T3FREE, THYROIDAB in the last 72 hours.  Invalid input(s): FREET3 ------------------------------------------------------------------------------------------------------------------ No results for input(s): VITAMINB12, FOLATE, FERRITIN, TIBC, IRON, RETICCTPCT in the last 72 hours.  Coagulation profile No results for input(s): INR, PROTIME in the last 168 hours.  No results for input(s): DDIMER in the last 72 hours.  Cardiac Enzymes  Recent Labs Lab 12/01/16 1802  TROPONINI <0.03   ------------------------------------------------------------------------------------------------------------------ No results found for: BNP  Inpatient Medications  Scheduled Meds: . acyclovir  800 mg Intravenous Q12H  . ampicillin (OMNIPEN) IV  2 g Intravenous Q8H  . cefTRIAXone (ROCEPHIN)  IV  2 g Intravenous Q12H  . lacosamide  50 mg Oral BID  . omega-3 acid ethyl esters  1 g Oral Daily  . pravastatin  10 mg Oral QHS  . sodium chloride flush  3 mL Intravenous Q12H  . vancomycin  1,000 mg Intravenous Q24H   Continuous Infusions:  PRN Meds:.acetaminophen **OR** acetaminophen, lip balm, LORazepam, ondansetron, promethazine  Micro Results Recent Results (from the past 240 hour(s))  CSF culture     Status: None (Preliminary result)   Collection Time: 12/03/16 12:19 PM  Result Value Ref Range Status   Specimen Description CSF  Final   Special Requests TUBE 1  Final   Gram Stain   Final    WBC PRESENT, PREDOMINANTLY MONONUCLEAR NO ORGANISMS SEEN CYTOSPIN SMEAR    Culture  NO GROWTH 2 DAYS  Final   Report Status PENDING  Incomplete    Radiology Reports Dg Chest 1 View  Result Date: 12/01/2016 CLINICAL DATA:  Pt came in with confusion, that started today, he does not recall any injury, no chest complaints, came from ct, non smoker EXAM: CHEST 1 VIEW COMPARISON:  None. FINDINGS: Normal mediastinum and cardiac silhouette. Normal pulmonary vasculature. No evidence of effusion, infiltrate, or pneumothorax. No acute bony abnormality. IMPRESSION: No acute cardiopulmonary process. Electronically Signed   By: Suzy Bouchard M.D.   On: 12/01/2016 14:03   Ct Head Wo Contrast  Result Date: 12/01/2016 CLINICAL DATA:  Confusion EXAM: CT HEAD WITHOUT CONTRAST TECHNIQUE: Contiguous axial images were obtained from the base of the skull through the vertex without intravenous contrast. COMPARISON:  MRI 02/23/2005 FINDINGS: Brain: No acute territorial infarction or intracranial hemorrhage is visualized. Mildly enlarged ventricles since the prior exam. No significant atrophy. No  focal mass, midline shift or mass effect. Vascular: No hyperdense vessels. Mild carotid artery calcifications. Skull: Mastoids are clear.  No skull fracture Sinuses/Orbits: Mucosal thickening within the ethmoids and maxillary sinuses with small fluid levels in the maxillary sinuses. No acute orbital abnormality. Other: None IMPRESSION: 1. No CT evidence for intracranial hemorrhage or large vessel territorial infarct or intracranial mass 2. Ventricles are slightly enlarged since the prior exam. Mild atrophy is present, however ventricular size appears somewhat disproportionate to atrophy, this raises the possibility of normal pressure hydrocephalus. Clinical correlation is required. Electronically Signed   By: Donavan Foil M.D.   On: 12/01/2016 14:06   Mr Brain Wo Contrast  Result Date: 12/01/2016 CLINICAL DATA:  A few days of increasing and confusion, obtunded. Recent seizure. History of prostate cancer, seizures,  hyperlipidemia. EXAM: MRI HEAD WITHOUT CONTRAST TECHNIQUE: Multiplanar, multiecho pulse sequences of the brain and surrounding structures were obtained without intravenous contrast. COMPARISON:  CT HEAD December 01, 2016 at 1341 hours and MRI of the head February 23, 2005 FINDINGS: Multiple sequences are moderate to severely motion degraded. BRAIN: No reduced diffusion to suggest acute ischemia. No susceptibility artifact to suggest hemorrhage. Moderate to severe ventriculomegaly, disproportionate sulcal effacement at the convexities. No suspicious parenchymal signal, masses or mass effect. No abnormal extra-axial fluid collections. No extra-axial masses though, contrast enhanced sequences would be more sensitive. Limited assessment of the hippocampi due to motion. Generally symmetric size and signal. VASCULAR: Normal major intracranial vascular flow voids present at skull base. SKULL AND UPPER CERVICAL SPINE: No abnormal sellar expansion. No suspicious calvarial bone marrow signal. Craniocervical junction maintained. Cerebellar tonsils at but not below the foramen magnum. SINUSES/ORBITS: Paranasal sinus mucosal thickening. Mastoid air cells are well aerated. The included ocular globes and orbital contents are non-suspicious. OTHER: None. IMPRESSION: No acute intracranial process on this moderate to severely motion degraded examination. 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 Electronically Signed   By: Elon Alas M.D.   On: 12/01/2016 20:59   Dg Fluoro Guide Lumbar Puncture  Result Date: 12/03/2016 Gilford Silvius, MD     12/03/2016 12:43 PM Successful LP at L5/S1.  Low volume CSF collection, see imaging report. JWatts MD   Time Spent in minutes  25   Louellen Molder M.D on 12/05/2016 at 1:42 PM  Between 7am to 7pm - Pager - [phone]  After 7pm go to [email or web address] - password [facility]  [facility] -  Office  [phone]

## 2016-12-05 NOTE — Evaluation (Signed)
Physical Therapy Evaluation Patient Details Name: Lance Todd MRN: [id number] DOB: [birth year] Today's Date: 12/05/2016   History of Present Illness  76 y.o. male presented to the ED from home with AMS. Further testeing determinted acute metabolic encephalopathy/aseptic meningitis with seizures. PMH significant for BPH/prostate cancer status post surgical resectio, HLD, and PTSD. Patient is former [employer].  Clinical Impression  Pt admitted with above diagnosis. Pt currently with functional limitations due to the deficits listed below (see PT Problem List). On eval, pt required min assist for bed mobility and transfers. Unable to progress ambulation due to weakness. See below for eval details. Pt will benefit from skilled PT to increase their independence and safety with mobility to allow discharge to the venue listed below.       Follow Up Recommendations CIR    Equipment Recommendations  Other (comment) (TBD)    Recommendations for Other Services Rehab consult     Precautions / Restrictions Precautions Precautions: Fall Restrictions Weight Bearing Restrictions: No      Mobility  Bed Mobility Overal bed mobility: Needs Assistance Bed Mobility: Supine to Sit     Supine to sit: Min assist;HOB elevated     General bed mobility comments: +rail, verbal cues for sequencing  Transfers Overall transfer level: Needs assistance Equipment used: Rolling walker (2 wheeled) Transfers: Sit to/from [facility] Sit to Stand: Mod assist Stand pivot transfers: Mod assist       General transfer comment: small shuffle pivot steps bed to recliner. Pt very unsteady. Continuous verbal cues for sequencing and safety.  Ambulation/Gait             General Gait Details: unable due to weakness  Stairs            Wheelchair Mobility    Modified Rankin (Stroke Patients Only)       Balance Overall balance assessment: Needs assistance Sitting-balance  support: No upper extremity supported;Feet supported Sitting balance-Leahy Scale: Good     Standing balance support: Bilateral upper extremity supported;During functional activity Standing balance-Leahy Scale: Poor                               Pertinent Vitals/Pain Pain Assessment: No/denies pain    Home Living Family/patient expects to be discharged to:: Private residence Living Arrangements: Spouse/significant other Available Help at Discharge: Family;Available 24 hours/day Type of Home: House Home Access: Stairs to enter Entrance Stairs-Rails: None Entrance Stairs-Number of Steps: 5 Home Layout: One level Home Equipment: None      Prior Function Level of Independence: Independent         Comments: Active. Still drives.     Hand Dominance        Extremity/Trunk Assessment   Upper Extremity Assessment Upper Extremity Assessment: Generalized weakness    Lower Extremity Assessment Lower Extremity Assessment: Generalized weakness    Cervical / Trunk Assessment Cervical / Trunk Assessment: Normal  Communication   Communication: No difficulties  Cognition Arousal/Alertness: Awake/alert Behavior During Therapy: Flat affect Overall Cognitive Status: Impaired/Different from baseline Area of Impairment: Orientation;Attention;Memory;Following commands;Safety/judgement;Problem solving Orientation Level: Disoriented to;Place;Time;Situation Current Attention Level: Selective Memory: Decreased short-term memory Following Commands: Follows one step commands with increased time;Follows multi-step commands inconsistently Safety/Judgement: Decreased awareness of deficits;Decreased awareness of safety   Problem Solving: Slow processing;Difficulty sequencing;Requires verbal cues      General Comments      Exercises     Assessment/Plan    PT Assessment  Patient needs continued PT services  PT Problem List Decreased strength;Decreased mobility;Decreased  safety awareness;Decreased knowledge of precautions;Decreased coordination;Decreased activity tolerance;Decreased cognition;Decreased balance          PT Treatment Interventions DME instruction;Gait training;Functional mobility training;Therapeutic activities;Therapeutic exercise;Balance training;Neuromuscular re-education;Patient/family education;Cognitive remediation    PT Goals (Current goals can be found in the Care Plan section)  Acute Rehab PT Goals Patient Stated Goal: per son, for pt to return home with wife PT Goal Formulation: With patient/family Time For Goal Achievement: 12/19/16 Potential to Achieve Goals: Good    Frequency Min 3X/week   Barriers to discharge        Co-evaluation               End of Session Equipment Utilized During Treatment: Gait belt Activity Tolerance: Patient tolerated treatment well Patient left: in chair;with chair alarm set;with call bell/phone within reach Nurse Communication: Mobility status         Time: [id number] PT Time Calculation (min) (ACUTE ONLY): 39 min   Charges:   PT Evaluation $PT Eval Moderate Complexity: 1 Procedure PT Treatments $Therapeutic Activity: 23-37 mins   PT G Codes:        Lorriane Shire 12/05/2016, 11:32 AM

## 2016-12-05 NOTE — Progress Notes (Signed)
Pasrr received: [id number] [place] LCSWA [phone]

## 2016-12-06 DIAGNOSIS — R569 Unspecified convulsions: Secondary | ICD-10-CM

## 2016-12-06 DIAGNOSIS — N183 Chronic kidney disease, stage 3 unspecified: Secondary | ICD-10-CM | POA: Insufficient documentation

## 2016-12-06 DIAGNOSIS — G039 Meningitis, unspecified: Secondary | ICD-10-CM

## 2016-12-06 DIAGNOSIS — R410 Disorientation, unspecified: Secondary | ICD-10-CM

## 2016-12-06 DIAGNOSIS — G9340 Encephalopathy, unspecified: Secondary | ICD-10-CM

## 2016-12-06 DIAGNOSIS — Z8546 Personal history of malignant neoplasm of prostate: Secondary | ICD-10-CM

## 2016-12-06 DIAGNOSIS — D62 Acute posthemorrhagic anemia: Secondary | ICD-10-CM | POA: Diagnosis not present

## 2016-12-06 DIAGNOSIS — N400 Enlarged prostate without lower urinary tract symptoms: Secondary | ICD-10-CM | POA: Diagnosis present

## 2016-12-06 DIAGNOSIS — F4310 Post-traumatic stress disorder, unspecified: Secondary | ICD-10-CM | POA: Diagnosis present

## 2016-12-06 DIAGNOSIS — E784 Other hyperlipidemia: Secondary | ICD-10-CM

## 2016-12-06 DIAGNOSIS — N289 Disorder of kidney and ureter, unspecified: Secondary | ICD-10-CM

## 2016-12-06 LAB — CSF CULTURE W GRAM STAIN: Culture: NO GROWTH

## 2016-12-06 LAB — BASIC METABOLIC PANEL
Anion gap: 8 (ref 5–15)
BUN: 10 mg/dL (ref 6–20)
CO2: 26 mmol/L (ref 22–32)
CREATININE: 1.72 mg/dL — AB (ref 0.61–1.24)
Calcium: 8.6 mg/dL — ABNORMAL LOW (ref 8.9–10.3)
Chloride: 110 mmol/L (ref 101–111)
GFR calc Af Amer: 42 mL/min — ABNORMAL LOW (ref >60)
GFR calc non Af Amer: 37 mL/min — ABNORMAL LOW (ref >60)
GLUCOSE: 108 mg/dL — AB (ref 65–99)
POTASSIUM: 3.7 mmol/L (ref 3.5–5.1)
SODIUM: 144 mmol/L (ref 135–145)

## 2016-12-06 LAB — VITAMIN B1: Vitamin B1 (Thiamine): 101.1 nmol/L (ref 66.5–200.0)

## 2016-12-06 LAB — CSF CULTURE

## 2016-12-06 NOTE — Progress Notes (Signed)
PROGRESS NOTE                                                                                                                                                                                                             Patient Demographics:    Lance Todd, is a 76 y.o. male, DOB - [birth year], [id number]  Admit date - 12/01/2016   Admitting Physician Waldemar Dickens, MD  Outpatient Primary MD for the patient is No primary care provider on file.  LOS - 4  Outpatient Specialists:none  Chief Complaint  Patient presents with  . Altered Mental Status       Brief Narrative   76 year old male with history of BPH/prostate cancer status post resection, depression, hyperlipidemia and PTSD presented with 2-3 days of increasing confusion. No recent fevers or chills. No change in his medications recently. In the ED was found to have a witnessed tonic clonic seizures and was given 2 mg IV Ativan. Admitted to hospitalist service.  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 Neurology consulted and Patient placed on continuous EEG monitoring. Given persistent encephalopathy he was started on empiric treatment for meningitis.    Subjective:   Continues to improve and oriented.   Assessment  & Plan :    Active Problems: Acute metabolic encephalopathy/aseptic meningitis with seizures  Cultures so far negative. Serum HSV antibody pending. Cryptococcal antigen negative. Elevated protein on CSF which could be due to aseptic meningitis. Symptoms continue to improve.  discontinue antibiotics. Continue acyclovir for now.     Seizure ([place]) No further seizure activity. Started on Vimpat by neurology. Patient instructed that he cannot drive until cleared by outpatient neurologist. Video EEG monitoring negative for seizure activity and shows diffuse  slowing. Polypharmacy is another concern (trazodone, Seroquel and Zoloft: Holding all). Urine drug screen unremarkable. TSH, RPR, HIV antibody on thiamine and ammonia normal.   acute on chronic kidney injury UA unremarkable. Family reports patient has chronic kidney disease stage III. No baseline renal function known. Improving with renal function (I suspect he is at baseline). Follows at the [place].    Depression/PTSD Holding home medications.  Prostate cancer with history of resection. Holding  Flomax  Hypophosphatemia Replenished with K-Phos.  ? 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 Should not cause  seizures. Per daughter patient has shuffling gait with slow confusion for past 1 year. Discussed with neurosurgery Dr. Trenton Gammon recommends outpatient follow-up with him in 3-4 weeks .Marland Kitchen  Family  insist to speak with him. I have spoken with Dr. Trenton Gammon again who will contact the family.  Code Status : Full code  Family Communication  :  Discussed at length with patient's daughter at bedside (Dr. Santiago Glad is the primary contact. Phone [phone])  Disposition Plan  : PT recommends CIR. Consulted. Possible discharge on 1/15.  Barriers For Discharge : Improving symptoms  Consults  :   Neurology Neurosurgery  Procedures  :  MRI brain EEG  DVT Prophylaxis  :  Subcutaneous heparin  Lab Results  Component Value Date   PLT 183 12/02/2016    Antibiotics  :    Anti-infectives    Start     Dose/Rate Route Frequency Ordered Stop   12/03/16 2200  acyclovir (ZOVIRAX) 800 mg in dextrose 5 % 150 mL IVPB     800 mg 166 mL/hr over 60 Minutes Intravenous Every 12 hours 12/03/16 1831     12/02/16 0015  ampicillin (OMNIPEN) 2 g in sodium chloride 0.9 % 50 mL IVPB     2 g 150 mL/hr over 20 Minutes Intravenous Every 8 hours 12/02/16 0002     12/02/16 0015  cefTRIAXone (ROCEPHIN) 2 g in dextrose 5 % 50 mL IVPB     2 g 100 mL/hr over 30 Minutes Intravenous Every 12 hours 12/02/16 0002     12/02/16 0015  acyclovir  (ZOVIRAX) 800 mg in dextrose 5 % 150 mL IVPB  Status:  Discontinued     800 mg 166 mL/hr over 60 Minutes Intravenous Every 12 hours 12/02/16 0002 12/03/16 1807   12/02/16 0015  vancomycin (VANCOCIN) IVPB 1000 mg/200 mL premix  Status:  Discontinued     1,000 mg 200 mL/hr over 60 Minutes Intravenous Every 24 hours 12/02/16 0002 12/06/16 1037        Objective:   Vitals:   12/05/16 0533 12/05/16 1337 12/05/16 2107 12/06/16 0526  BP: (!) 134/53 (!) 155/69 (!) 105/45 134/69  Pulse: (!) 50 91 (!) 54 (!) 58  Resp: 17 20 19 17   Temp: 98.2 F (36.8 C) 97.5 F (36.4 C) 98.1 F (36.7 C) 98.1 F (36.7 C)  TempSrc: Oral Oral Oral   SpO2: 98% 100% 100% 98%  Weight:      Height:        Wt Readings from Last 3 Encounters:  12/01/16 77.6 kg (171 lb)     Intake/Output Summary (Last 24 hours) at 12/06/16 1214 Last data filed at 12/06/16 1043  Gross per 24 hour  Intake             1342 ml  Output             1000 ml  Net              342 ml     Physical Exam  Gen:Not in distress HEENT:  moist mucosa, supple neck Chest: clear b/l, no added sounds CVS: N S1&S2, no murmurs, GI: soft, NT, ND,  Musculoskeletal: warm, no edema CNS: alert and awake, oriented 2-3    Data Review:    CBC  Recent Labs Lab 12/01/16 1336 12/02/16 0837  WBC 5.1 8.2  HGB 11.6* 11.0*  HCT 32.7* 30.8*  PLT 171 183  MCV 80.1 80.4  MCH 28.4 28.7  MCHC 35.5 35.7  RDW 13.4  13.9  LYMPHSABS 1.0  --   MONOABS 0.3  --   EOSABS 0.1  --   BASOSABS 0.0  --     Chemistries   Recent Labs Lab 12/01/16 1336 12/01/16 1802 12/02/16 0837 12/03/16 0806 12/04/16 0651 12/06/16 0650  NA 139  --  140 140 142 144  K 4.3  --  3.9 3.7 3.8 3.7  CL 109  --  110 110 111 110  CO2 25  --  22 21* 23 26  GLUCOSE 152*  --  117* 97 80 108*  BUN 22*  --  17 14 10 10   CREATININE 2.03*  --  1.92* 1.67* 1.63* 1.72*  CALCIUM 9.0  --  8.7* 8.2* 8.3* 8.6*  MG  --  2.3  --   --   --   --   AST 18  --  25  --   --   --    ALT 15*  --  16*  --   --   --   ALKPHOS 41  --  37*  --   --   --   BILITOT 0.6  --  0.9  --   --   --    ------------------------------------------------------------------------------------------------------------------ No results for input(s): CHOL, HDL, LDLCALC, TRIG, CHOLHDL, LDLDIRECT in the last 72 hours.  Lab Results  Component Value Date   HGBA1C 5.5 12/01/2016   ------------------------------------------------------------------------------------------------------------------ No results for input(s): TSH, T4TOTAL, T3FREE, THYROIDAB in the last 72 hours.  Invalid input(s): FREET3 ------------------------------------------------------------------------------------------------------------------ No results for input(s): VITAMINB12, FOLATE, FERRITIN, TIBC, IRON, RETICCTPCT in the last 72 hours.  Coagulation profile No results for input(s): INR, PROTIME in the last 168 hours.  No results for input(s): DDIMER in the last 72 hours.  Cardiac Enzymes  Recent Labs Lab 12/01/16 1802  TROPONINI <0.03   ------------------------------------------------------------------------------------------------------------------ No results found for: BNP  Inpatient Medications  Scheduled Meds: . acyclovir  800 mg Intravenous Q12H  . ampicillin (OMNIPEN) IV  2 g Intravenous Q8H  . cefTRIAXone (ROCEPHIN)  IV  2 g Intravenous Q12H  . lacosamide  50 mg Oral BID  . omega-3 acid ethyl esters  1 g Oral Daily  . pravastatin  10 mg Oral QHS  . sodium chloride flush  3 mL Intravenous Q12H   Continuous Infusions:  PRN Meds:.acetaminophen **OR** acetaminophen, lip balm, LORazepam, ondansetron, promethazine  Micro Results Recent Results (from the past 240 hour(s))  CSF culture     Status: None (Preliminary result)   Collection Time: 12/03/16 12:19 PM  Result Value Ref Range Status   Specimen Description CSF  Final   Special Requests TUBE 1  Final   Gram Stain   Final    WBC PRESENT,  PREDOMINANTLY MONONUCLEAR NO ORGANISMS SEEN CYTOSPIN SMEAR    Culture NO GROWTH 3 DAYS  Final   Report Status PENDING  Incomplete  Fungus Culture With Stain     Status: None (Preliminary result)   Collection Time: 12/03/16 12:19 PM  Result Value Ref Range Status   Fungus Stain Final report  Final    Comment: (NOTE) Performed At: [facility] [place], [place] [id number] Lindon Romp MD [id number]    Fungus (Mycology) Culture PENDING  Incomplete   Fungal Source CSF  Final    Comment:  TUBE 1  Fungus Culture Result     Status: None   Collection Time: 12/03/16 12:19 PM  Result Value Ref Range Status   Result 1 Comment  Final  Comment: (NOTE) KOH/Calcofluor preparation:  no fungus observed. Performed At: [facility] [place], [place] [id number] Lindon Romp MD [id number]     Radiology Reports Dg Chest 1 View  Result Date: 12/01/2016 CLINICAL DATA:  Pt came in with confusion, that started today, he does not recall any injury, no chest complaints, came from ct, non smoker EXAM: CHEST 1 VIEW COMPARISON:  None. FINDINGS: Normal mediastinum and cardiac silhouette. Normal pulmonary vasculature. No evidence of effusion, infiltrate, or pneumothorax. No acute bony abnormality. IMPRESSION: No acute cardiopulmonary process. Electronically Signed   By: Suzy Bouchard M.D.   On: 12/01/2016 14:03   Ct Head Wo Contrast  Result Date: 12/01/2016 CLINICAL DATA:  Confusion EXAM: CT HEAD WITHOUT CONTRAST TECHNIQUE: Contiguous axial images were obtained from the base of the skull through the vertex without intravenous contrast. COMPARISON:  MRI 02/23/2005 FINDINGS: Brain: No acute territorial infarction or intracranial hemorrhage is visualized. Mildly enlarged ventricles since the prior exam. No significant atrophy. No focal mass, midline shift or mass effect. Vascular: No hyperdense vessels. Mild carotid artery calcifications. Skull: Mastoids  are clear.  No skull fracture Sinuses/Orbits: Mucosal thickening within the ethmoids and maxillary sinuses with small fluid levels in the maxillary sinuses. No acute orbital abnormality. Other: None IMPRESSION: 1. No CT evidence for intracranial hemorrhage or large vessel territorial infarct or intracranial mass 2. Ventricles are slightly enlarged since the prior exam. Mild atrophy is present, however ventricular size appears somewhat disproportionate to atrophy, this raises the possibility of normal pressure hydrocephalus. Clinical correlation is required. Electronically Signed   By: Donavan Foil M.D.   On: 12/01/2016 14:06   Mr Brain Wo Contrast  Result Date: 12/01/2016 CLINICAL DATA:  A few days of increasing and confusion, obtunded. Recent seizure. History of prostate cancer, seizures, hyperlipidemia. EXAM: MRI HEAD WITHOUT CONTRAST TECHNIQUE: Multiplanar, multiecho pulse sequences of the brain and surrounding structures were obtained without intravenous contrast. COMPARISON:  CT HEAD December 01, 2016 at 1341 hours and MRI of the head February 23, 2005 FINDINGS: Multiple sequences are moderate to severely motion degraded. BRAIN: No reduced diffusion to suggest acute ischemia. No susceptibility artifact to suggest hemorrhage. Moderate to severe ventriculomegaly, disproportionate sulcal effacement at the convexities. No suspicious parenchymal signal, masses or mass effect. No abnormal extra-axial fluid collections. No extra-axial masses though, contrast enhanced sequences would be more sensitive. Limited assessment of the hippocampi due to motion. Generally symmetric size and signal. VASCULAR: Normal major intracranial vascular flow voids present at skull base. SKULL AND UPPER CERVICAL SPINE: No abnormal sellar expansion. No suspicious calvarial bone marrow signal. Craniocervical junction maintained. Cerebellar tonsils at but not below the foramen magnum. SINUSES/ORBITS: Paranasal sinus mucosal thickening. Mastoid  air cells are well aerated. The included ocular globes and orbital contents are non-suspicious. OTHER: None. IMPRESSION: No acute intracranial process on this moderate to severely motion degraded examination. 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 Electronically Signed   By: Elon Alas M.D.   On: 12/01/2016 20:59   Dg Fluoro Guide Lumbar Puncture  Result Date: 12/03/2016 Gilford Silvius, MD     12/03/2016 12:43 PM Successful LP at L5/S1.  Low volume CSF collection, see imaging report. JWatts MD   Time Spent in minutes  25   Louellen Molder M.D on 12/06/2016 at 12:14 PM  Between 7am to 7pm - Pager - [phone]  After 7pm go to [email or web address] - password [facility]  [facility] -  Office  [phone]

## 2016-12-06 NOTE — Progress Notes (Signed)
Pharmacy Antibiotic Note  Lance Todd is a 76 y.o. male admitted on 12/01/2016 with confusion.   Continues on Ceftriaxone, ampicillin, and acyclovir  Cultures negative to date for meningitis Scr stable   Plan: -Vancomycin stopped -Continue Ceftriaxone 2g IV q12h per MD -Continue Ampicillin 2g IV q8h per MD -Acyclovir 10 mg/kg IV q12h   Height: 5' 7.5" (171.5 cm) Weight: 171 lb (77.6 kg) IBW/kg (Calculated) : 67.25  Temp (24hrs), Avg:97.9 F (36.6 C), Min:97.5 F (36.4 C), Max:98.1 F (36.7 C)   Recent Labs Lab 12/01/16 1336 12/01/16 1802 12/02/16 0837 12/03/16 0806 12/04/16 0651 12/06/16 0650  WBC 5.1  --  8.2  --   --   --   CREATININE 2.03*  --  1.92* 1.67* 1.63* 1.72*  LATICACIDVEN  --  1.7  --   --   --   --     Estimated Creatinine Clearance: 34.2 mL/min (by C-G formula based on SCr of 1.72 mg/dL (H)).    No Known Allergies Anette Guarneri, PharmD [phone]  12/06/2016 11:55 AM

## 2016-12-06 NOTE — Consult Note (Signed)
Physical Medicine and Rehabilitation Consult Reason for Consult: Debility Referring Physician: Darleen Crocker   HPI: Lance Todd is a 76 y.o. male with pmh of BPH, prostate CA s/p resection, HLD, PTSD presented on 12/01/16 with encephalopathy due to aseptic meningitis and seizures.  History taken from chart review as pt unwilling to participate with questioning and exam. Pt had 3 day history of increasing confusion.  In ED he was witnessed to have a seizure as well as reported staring spells at home prior. He was determined to have metabolic encephalopathy and aseptic meningitis with seizures.  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  Neurology was consulted and pt placed on continuous EEG and empiric treatment for meningitis.  Pt with associated variability in heart rate with bradycardia and SVT. Results of workup pending.  Per report, symptoms are improving.   Review of Systems  Unable to perform ROS: Medical condition  Pt not willing to participate  Past Medical History:  Diagnosis Date  . Cancer determined by prostate biopsy ([place])   . Depression   . Hypercholesterolemia   . PTSD (post-traumatic stress disorder)    Past Surgical History:  Procedure Laterality Date  . PROSTATE SURGERY     Family History  Problem Relation Age of Onset  . Seizures Sister    Social History:  reports that he has never smoked. He has never used smokeless tobacco. He reports that he does not drink alcohol or use drugs. Allergies: No Known Allergies Medications Prior to Admission  Medication Sig Dispense Refill  . Carboxymethylcellulose Sod PF 0.25 % SOLN Place 1 drop into both eyes daily.    . cetirizine (ZYRTEC) 10 MG tablet Take 10 mg by mouth daily as needed for allergies.    . Cholecalciferol (VITAMIN D-3) 1000 units CAPS Take 1 capsule by mouth daily.    Marland Kitchen docusate sodium (COLACE) 100 MG capsule Take 200 mg by mouth daily.    Marland Kitchen omega-3 acid ethyl esters (LOVAZA) 1 g capsule Take 1 g by  mouth daily.    . pravastatin (PRAVACHOL) 10 MG tablet Take 10 mg by mouth at bedtime.    . prazosin (MINIPRESS) 2 MG capsule Take 2 mg by mouth at bedtime.    Marland Kitchen QUEtiapine (SEROQUEL) 100 MG tablet Take 100 mg by mouth at bedtime.    . sertraline (ZOLOFT) 100 MG tablet Take 100 mg by mouth daily.    . tamsulosin (FLOMAX) 0.4 MG CAPS capsule Take 0.8 mg by mouth at bedtime.    . traZODone (DESYREL) 150 MG tablet Take 150 mg by mouth at bedtime.      Home: Home Living Family/patient expects to be discharged to:: Private residence Living Arrangements: Spouse/significant other Available Help at Discharge: Family, Available 24 hours/day Type of Home: House Home Access: Stairs to enter [employer] of Steps: 5 Entrance Stairs-Rails: None Home Layout: One level Biochemist, clinical: Standard Home Equipment: None  Functional History: Prior Function Level of Independence: Independent Comments: Active. Still drives. Functional Status:  Mobility: Bed Mobility Overal bed mobility: Needs Assistance Bed Mobility: Supine to Sit Supine to sit: Min assist, HOB elevated General bed mobility comments: +rail, verbal cues for sequencing Transfers Overall transfer level: Needs assistance Equipment used: Rolling walker (2 wheeled) Transfers: Sit to/from Stand, [employer] Transfers Sit to Stand: Mod assist Stand pivot transfers: Mod assist General transfer comment: small shuffle pivot steps bed to recliner. Pt very unsteady. Continuous verbal cues for sequencing and safety. Ambulation/Gait General Gait  Details: unable due to weakness    ADL:    Cognition: Cognition Overall Cognitive Status: Impaired/Different from baseline Orientation Level: Oriented to person, Oriented to place, Disoriented to time, Disoriented to situation Cognition Arousal/Alertness: Awake/alert Behavior During Therapy: Flat affect Overall Cognitive Status: Impaired/Different from baseline Area of Impairment:  Orientation, Attention, Memory, Following commands, Safety/judgement, Problem solving Orientation Level: Disoriented to, Place, Time, Situation Current Attention Level: Selective Memory: Decreased short-term memory Following Commands: Follows one step commands with increased time, Follows multi-step commands inconsistently Safety/Judgement: Decreased awareness of deficits, Decreased awareness of safety Problem Solving: Slow processing, Difficulty sequencing, Requires verbal cues  Blood pressure 134/69, pulse (!) 58, temperature 98.1 F (36.7 C), resp. rate 17, height 5' 7.5" (1.715 m), weight 77.6 kg (171 lb), SpO2 98 %. Physical Exam  Vitals reviewed. Constitutional: He appears well-developed and well-nourished.  HENT:  Head: Normocephalic and atraumatic.  Eyes: Conjunctivae are normal. Right eye exhibits no discharge. Left eye exhibits no discharge.  Neck: Normal range of motion. Neck supple.  Cardiovascular: Normal rate.   Respiratory: Effort normal and breath sounds normal.  GI: Soft. Bowel sounds are normal.  Musculoskeletal: He exhibits no edema or tenderness.  Neurological:  Pt keeps head under side rail padding, limited engagement Motor: RUE: 4+/5 proximal to distal, not will to participate with remainder of exam Does not answer orientation questions  Skin: Skin is warm and dry.  Psychiatric: His affect is inappropriate. He is agitated and slowed. Cognition and memory are impaired. He expresses inappropriate judgment. He is noncommunicative.    Results for orders placed or performed during the hospital encounter of 12/01/16 (from the past 24 hour(s))  Basic metabolic panel     Status: Abnormal   Collection Time: 12/06/16  6:50 AM  Result Value Ref Range   Sodium 144 135 - 145 mmol/L   Potassium 3.7 3.5 - 5.1 mmol/L   Chloride 110 101 - 111 mmol/L   CO2 26 22 - 32 mmol/L   Glucose, Bld 108 (H) 65 - 99 mg/dL   BUN 10 6 - 20 mg/dL   Creatinine, Ser 1.72 (H) 0.61 - 1.24  mg/dL   Calcium 8.6 (L) 8.9 - 10.3 mg/dL   GFR calc non Af Amer 37 (L) >60 mL/min   GFR calc Af Amer 42 (L) >60 mL/min   Anion gap 8 5 - 15   No results found.  Assessment/Plan: Diagnosis: acute encephalopathy due to aseptic meningitis and seizures Labs and images independently reviewed.  Records reviewed and summated above.  1. Does the need for close, 24 hr/day medical supervision in concert with the patient's rehab needs make it unreasonable for this patient to be served in a less intensive setting? Yes 2. Co-Morbidities requiring supervision/potential complications: seizures (cont meds), BPH (resume meds when appropriate), history of prostate CA (cont to monitor), HLD (cont meds), PTSD (ensure mood does not limit functional progress), CKD (avoid nephrotoxic meds), hypophosphatemia (replete as necessary), aseptic meningitis (follow cultures, wean abx as appropriate, including IV Vanc), ABLA (transfuse if necessary to ensure appropriate perfusion for increased activity tolerance) 3. Due to bladder management, bowel management, safety, skin/wound care, disease management, medication administration, pain management and patient education, does the patient require 24 hr/day rehab nursing? Yes 4. Does the patient require coordinated care of a physician, rehab nurse, PT (1-2 hrs/day, 5 days/week), OT (1-2 hrs/day, 5 days/week) and SLP (1-2 hrs/day, 5 days/week) to address physical and functional deficits in the context of the above medical diagnosis(es)? Yes Addressing  deficits in the following areas: balance, endurance, locomotion, strength, transferring, bowel/bladder control, bathing, dressing, feeding, grooming, toileting, cognition, speech, language, swallowing and psychosocial support 5. Can the patient actively participate in an intensive therapy program of at least 3 hrs of therapy per day at least 5 days per week? Potentially 6. The potential for patient to make measurable gains while on  inpatient rehab is excellent 7. Anticipated functional outcomes upon discharge from inpatient rehab are supervision  with PT, supervision with OT, supervision and min assist with SLP. 8. Estimated rehab length of stay to reach the above functional goals is: 15-19 days. 9. Does the patient have adequate social supports and living environment to accommodate these discharge functional goals? Potentially 10. Anticipated D/C setting: Home 11. Anticipated post D/C treatments: HH therapy and Home excercise program 12. Overall Rehab/Functional Prognosis: good  RECOMMENDATIONS: This patient's condition is appropriate for continued rehabilitative care in the following setting: Will inquire about baseline level of functioning and caregiver support at discharge. Likely CIR once pt willing and able to tolerate 3 hours therapy/day. Patient has agreed to participate in recommended program. Potentially Note that insurance prior authorization may be required for reimbursement for recommended care.  Comment: Rehab Admissions Coordinator to follow up.  Delice Lesch, MD, Mellody Drown 12/06/2016

## 2016-12-07 MED ORDER — TAMSULOSIN HCL 0.4 MG PO CAPS
0.8000 mg | ORAL_CAPSULE | Freq: Every day | ORAL | Status: DC
  Administered 2016-12-08: 0.8 mg via ORAL
  Filled 2016-12-07: qty 2

## 2016-12-07 MED ORDER — QUETIAPINE FUMARATE 25 MG PO TABS
50.0000 mg | ORAL_TABLET | Freq: Every day | ORAL | Status: DC
  Administered 2016-12-07: 50 mg via ORAL
  Filled 2016-12-07: qty 2

## 2016-12-07 MED ORDER — HYDRALAZINE HCL 10 MG PO TABS
10.0000 mg | ORAL_TABLET | Freq: Once | ORAL | Status: AC
  Administered 2016-12-07: 10 mg via ORAL
  Filled 2016-12-07: qty 1

## 2016-12-07 NOTE — Progress Notes (Addendum)
Pt's BP 165/62. MD made aware. Hydralazine ordered. Will continue to monitor.

## 2016-12-07 NOTE — Progress Notes (Signed)
Physical Therapy Treatment Patient Details Name: Lance Todd MRN: [id number] DOB: [birth year] Today's Date: 12/07/2016    History of Present Illness 76 y.o. male presented to the ED from home with AMS. Further testeing determinted acute metabolic encephalopathy/aseptic meningitis with seizures. PMH significant for BPH/prostate cancer status post surgical resectio, HLD, and PTSD. Patient is former [employer] conservation [employer].    PT Comments    Pt displayed delayed processing and confusion throughout entire treatment, requiring constant v/c for transfers and gait training.  Pt would benefit from continued gait training during next treatment with emphasis on safety awareness and obstacle negotiation. Continued to recommend pt for CIR to increase strength and coordination for safety to increase pt independence.   Follow Up Recommendations  CIR     Equipment Recommendations  Other (comment) (TBA)    Recommendations for Other Services Rehab consult     Precautions / Restrictions Precautions Precautions: Fall Restrictions Weight Bearing Restrictions: No    Mobility  Bed Mobility Overal bed mobility: Needs Assistance Bed Mobility: Supine to Sit     Supine to sit: Min assist     General bed mobility comments: + rail, pt required mod A due to weakness and inability to carry out proper sequencing.  Transfers Overall transfer level: Needs assistance Equipment used: Rolling walker (2 wheeled) Transfers: Sit to/from Stand Sit to Stand: Mod assist         General transfer comment: Pt required repeated v/c for hand placement on bed and scooting to EOB for safe transfer. Mod A required due to patient LE weakness and decreased sitting balance.   Ambulation/Gait Ambulation/Gait assistance: +2 physical assistance;Mod assist Ambulation Distance (Feet): 30 Feet Assistive device: Rolling walker (2 wheeled) Gait Pattern/deviations: Shuffle;Trunk flexed;Narrow base of support;Staggering left;Staggering  right   Gait velocity interpretation: Below normal speed for age/gender General Gait Details: Pt presents with very low overall balance, requiring mod A, intermittent +2 A for redirecting walker during turns, obsticle negotiation and mod v/c for initiating gait sequencing.    Stairs            Wheelchair Mobility    Modified Rankin (Stroke Patients Only)       Balance Overall balance assessment: Needs assistance Sitting-balance support: Feet supported Sitting balance-Leahy Scale: Good Sitting balance - Comments: with feet unsupported, pt displayed poor sitting balance.    Standing balance support: Bilateral upper extremity supported;During functional activity Standing balance-Leahy Scale: Poor Standing balance comment: Pt unable to stand without RW or HHA due to delayed processing.                    Cognition Arousal/Alertness: Awake/alert Behavior During Therapy: Flat affect Overall Cognitive Status: Impaired/Different from baseline Area of Impairment: Orientation;Attention;Following commands;Safety/judgement;Awareness;Problem solving Orientation Level: Disoriented to;Situation;Place   Memory: Decreased short-term memory Following Commands: Follows one step commands inconsistently;Follows one step commands with increased time Safety/Judgement: Decreased awareness of deficits;Decreased awareness of safety   Problem Solving: Slow processing;Decreased initiation;Difficulty sequencing;Requires verbal cues;Requires tactile cues General Comments: pt required max v/c during entire treatment due to disoriented state. Displayed confused/ blank look on face when SPTA would vocalize commands/ simple tasks for pt.    Exercises      General Comments        Pertinent Vitals/Pain Pain Assessment: No/denies pain    Home Living                      Prior Function  PT Goals (current goals can now be found in the care plan section) Acute Rehab PT  Goals Patient Stated Goal: non stated Potential to Achieve Goals: Good Progress towards PT goals: Progressing toward goals    Frequency    Min 3X/week      PT Plan Current plan remains appropriate    Co-evaluation             End of Session Equipment Utilized During Treatment: Gait belt Activity Tolerance: Other (comment);Patient tolerated treatment well (Pt limited by cognition, strength, coordination and balance.) Patient left: in chair;with chair alarm set;with call bell/phone within reach     Time: [id number] PT Time Calculation (min) (ACUTE ONLY): 20 min  Charges:  $Gait Training: 8-22 mins                    G Codes:      [facility] 2016/12/18, 11:14 AM [place], [place] Pager [phone]

## 2016-12-07 NOTE — Progress Notes (Signed)
Benefit Check Vimpat: Clemens Catholic Maricella Filyaw, RN        S/W [place]ST[place]  @ [employer] # [phone]   IACOSAMIDE 50 MG TAB BID   COVER- YES  CO-PAY- $64.00 DEDUCTIBLE NOT MET  TIER- 3 DRUG  PRIOR APPROVAL- NO  PHARMACY : [facility]   VIMPAT- NONE FORMULARY

## 2016-12-07 NOTE — Progress Notes (Signed)
PROGRESS NOTE                                                                                                                                                                                                             Patient Demographics:    Lance Todd, is a 76 y.o. male, DOB - 04-06-1941, [id number]  Admit date - 12/01/2016   Admitting Physician Waldemar Dickens, MD  Outpatient Primary MD for the patient is No primary care provider on file.  LOS - 5  Outpatient Specialists:none  Chief Complaint  Patient presents with  . Altered Mental Status       Brief Narrative   76 year old male with history of BPH/prostate cancer status post resection, depression, hyperlipidemia and PTSD presented with 2-3 days of increasing confusion. No recent fevers or chills. No change in his medications recently. In the ED was found to have a witnessed tonic clonic seizures and was given 2 mg IV Ativan. Admitted to hospitalist service.  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 Neurology consulted and Patient placed on continuous EEG monitoring. Given persistent encephalopathy he was started on empiric treatment for meningitis.    Subjective:   Continues to improve.   Assessment  & Plan :    Active Problems: Acute metabolic encephalopathy/aseptic meningitis with seizures  Cultures so far negative. Serum HSV antibody pending (may take several days). Cryptococcal antigen negative. Elevated protein on CSF which could be due to aseptic meningitis. Symptoms continue to improve.  discontinue antibiotics. We completed 7 days of acyclovir tomorrow and discontinue.     Seizure ([place]) No further seizure activity. Started on Vimpat by neurology. Patient instructed that he cannot drive until cleared by outpatient neurologist. Video EEG monitoring negative for  seizure activity and shows diffuse slowing. Polypharmacy is another concern (trazodone, Seroquel and Zoloft: Holding all). Urine drug screen unremarkable. TSH, RPR, HIV antibody on thiamine and ammonia normal.   acute on chronic kidney injury UA unremarkable. Family reports patient has chronic kidney disease stage III. No baseline renal function known. Improving with renal function (I suspect he is at baseline). Follows at the [place].    Depression/PTSD Holding home medications.  Prostate cancer with history of resection. Holding  Flomax  Hypophosphatemia Replenished with K-Phos.  ? 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 Should not cause seizures. Per daughter patient has shuffling gait with slow confusion for past 1 year. Discussed with neurosurgery Dr. Trenton Gammon recommends outpatient follow-up with him in 3-4 weeks. (He spoke with family on the phone).  Code Status : Full code  Family Communication  :  Discussed at length with patient's daughter on the phone (Dr. Santiago Glad is the primary contact. Phone [phone])  Disposition Plan  : CIR approved for inpatient rehabilitation. Pending authorization. Can be discharged tomorrow either to CIR or SNF.  Barriers For Discharge : Improving symptoms, CIR versus SNF tomorrow  Consults  :   Neurology Neurosurgery  Procedures  :  MRI brain EEG  DVT Prophylaxis  :  Subcutaneous heparin  Lab Results  Component Value Date   PLT 183 12/02/2016    Antibiotics  :    Anti-infectives    Start     Dose/Rate Route Frequency Ordered Stop   12/03/16 2200  acyclovir (ZOVIRAX) 800 mg in dextrose 5 % 150 mL IVPB     800 mg 166 mL/hr over 60 Minutes Intravenous Every 12 hours 12/03/16 1831     12/02/16 0015  ampicillin (OMNIPEN) 2 g in sodium chloride 0.9 % 50 mL IVPB  Status:  Discontinued     2 g 150 mL/hr over 20 Minutes Intravenous Every 8 hours 12/02/16 0002 12/06/16 1216   12/02/16 0015  cefTRIAXone (ROCEPHIN) 2 g in dextrose 5 % 50 mL IVPB  Status:   Discontinued     2 g 100 mL/hr over 30 Minutes Intravenous Every 12 hours 12/02/16 0002 12/06/16 1216   12/02/16 0015  acyclovir (ZOVIRAX) 800 mg in dextrose 5 % 150 mL IVPB  Status:  Discontinued     800 mg 166 mL/hr over 60 Minutes Intravenous Every 12 hours 12/02/16 0002 12/03/16 1807   12/02/16 0015  vancomycin (VANCOCIN) IVPB 1000 mg/200 mL premix  Status:  Discontinued     1,000 mg 200 mL/hr over 60 Minutes Intravenous Every 24 hours 12/02/16 0002 12/06/16 1037        Objective:   Vitals:   12/06/16 1621 12/06/16 2129 12/07/16 0702 12/07/16 0939  BP: 137/71 (!) 158/77 (!) 165/62 (!) 155/71  Pulse: 72 64 67   Resp: 16 18 18    Temp: 97.4 F (36.3 C) 97.8 F (36.6 C) 98.2 F (36.8 C)   TempSrc: Oral Oral    SpO2: 95% 97% 100%   Weight:      Height:        Wt Readings from Last 3 Encounters:  12/01/16 77.6 kg (171 lb)     Intake/Output Summary (Last 24 hours) at 12/07/16 1327 Last data filed at 12/07/16 1044  Gross per 24 hour  Intake              412 ml  Output             1375 ml  Net             -963 ml     Physical Exam  Gen:Not in distress HEENT:  moist mucosa, supple neck Chest: clear b/l, no added sounds CVS: N S1&S2, no murmurs, GI: soft, NT, ND,  Musculoskeletal: warm, no edema CNS: alert and awake, oriented 2-3    Data Review:    CBC  Recent Labs Lab 12/01/16 1336 12/02/16 0837  WBC 5.1 8.2  HGB 11.6* 11.0*  HCT 32.7* 30.8*  PLT 171 183  MCV 80.1 80.4  MCH 28.4  28.7  MCHC 35.5 35.7  RDW 13.4 13.9  LYMPHSABS 1.0  --   MONOABS 0.3  --   EOSABS 0.1  --   BASOSABS 0.0  --     Chemistries   Recent Labs Lab 12/01/16 1336 12/01/16 1802 12/02/16 0837 12/03/16 0806 12/04/16 0651 12/06/16 0650  NA 139  --  140 140 142 144  K 4.3  --  3.9 3.7 3.8 3.7  CL 109  --  110 110 111 110  CO2 25  --  22 21* 23 26  GLUCOSE 152*  --  117* 97 80 108*  BUN 22*  --  17 14 10 10   CREATININE 2.03*  --  1.92* 1.67* 1.63* 1.72*  CALCIUM  9.0  --  8.7* 8.2* 8.3* 8.6*  MG  --  2.3  --   --   --   --   AST 18  --  25  --   --   --   ALT 15*  --  16*  --   --   --   ALKPHOS 41  --  37*  --   --   --   BILITOT 0.6  --  0.9  --   --   --    ------------------------------------------------------------------------------------------------------------------ No results for input(s): CHOL, HDL, LDLCALC, TRIG, CHOLHDL, LDLDIRECT in the last 72 hours.  Lab Results  Component Value Date   HGBA1C 5.5 12/01/2016   ------------------------------------------------------------------------------------------------------------------ No results for input(s): TSH, T4TOTAL, T3FREE, THYROIDAB in the last 72 hours.  Invalid input(s): FREET3 ------------------------------------------------------------------------------------------------------------------ No results for input(s): VITAMINB12, FOLATE, FERRITIN, TIBC, IRON, RETICCTPCT in the last 72 hours.  Coagulation profile No results for input(s): INR, PROTIME in the last 168 hours.  No results for input(s): DDIMER in the last 72 hours.  Cardiac Enzymes  Recent Labs Lab 12/01/16 1802  TROPONINI <0.03   ------------------------------------------------------------------------------------------------------------------ No results found for: BNP  Inpatient Medications  Scheduled Meds: . acyclovir  800 mg Intravenous Q12H  . lacosamide  50 mg Oral BID  . omega-3 acid ethyl esters  1 g Oral Daily  . pravastatin  10 mg Oral QHS  . sodium chloride flush  3 mL Intravenous Q12H   Continuous Infusions:  PRN Meds:.acetaminophen **OR** acetaminophen, lip balm, LORazepam, ondansetron, promethazine  Micro Results Recent Results (from the past 240 hour(s))  CSF culture     Status: None   Collection Time: 12/03/16 12:19 PM  Result Value Ref Range Status   Specimen Description CSF  Final   Special Requests TUBE 1  Final   Gram Stain   Final    WBC PRESENT, PREDOMINANTLY MONONUCLEAR NO  ORGANISMS SEEN CYTOSPIN SMEAR    Culture NO GROWTH 3 DAYS  Final   Report Status 12/06/2016 FINAL  Final  Fungus Culture With Stain     Status: None (Preliminary result)   Collection Time: 12/03/16 12:19 PM  Result Value Ref Range Status   Fungus Stain Final report  Final    Comment: (NOTE) Performed At: [facility] [place], [place] [id number] Lindon Romp MD [id number]    Fungus (Mycology) Culture PENDING  Incomplete   Fungal Source CSF  Final    Comment:  TUBE 1  Fungus Culture Result     Status: None   Collection Time: 12/03/16 12:19 PM  Result Value Ref Range Status   Result 1 Comment  Final    Comment: (NOTE) KOH/Calcofluor preparation:  no fungus observed. Performed At: [facility]  [place], [place] [id number] Lindon Romp MD [id number]     Radiology Reports Dg Chest 1 View  Result Date: 12/01/2016 CLINICAL DATA:  Pt came in with confusion, that started today, he does not recall any injury, no chest complaints, came from ct, non smoker EXAM: CHEST 1 VIEW COMPARISON:  None. FINDINGS: Normal mediastinum and cardiac silhouette. Normal pulmonary vasculature. No evidence of effusion, infiltrate, or pneumothorax. No acute bony abnormality. IMPRESSION: No acute cardiopulmonary process. Electronically Signed   By: Suzy Bouchard M.D.   On: 12/01/2016 14:03   Ct Head Wo Contrast  Result Date: 12/01/2016 CLINICAL DATA:  Confusion EXAM: CT HEAD WITHOUT CONTRAST TECHNIQUE: Contiguous axial images were obtained from the base of the skull through the vertex without intravenous contrast. COMPARISON:  MRI 02/23/2005 FINDINGS: Brain: No acute territorial infarction or intracranial hemorrhage is visualized. Mildly enlarged ventricles since the prior exam. No significant atrophy. No focal mass, midline shift or mass effect. Vascular: No hyperdense vessels. Mild carotid artery calcifications. Skull: Mastoids are clear.  No skull  fracture Sinuses/Orbits: Mucosal thickening within the ethmoids and maxillary sinuses with small fluid levels in the maxillary sinuses. No acute orbital abnormality. Other: None IMPRESSION: 1. No CT evidence for intracranial hemorrhage or large vessel territorial infarct or intracranial mass 2. Ventricles are slightly enlarged since the prior exam. Mild atrophy is present, however ventricular size appears somewhat disproportionate to atrophy, this raises the possibility of normal pressure hydrocephalus. Clinical correlation is required. Electronically Signed   By: Donavan Foil M.D.   On: 12/01/2016 14:06   Mr Brain Wo Contrast  Result Date: 12/01/2016 CLINICAL DATA:  A few days of increasing and confusion, obtunded. Recent seizure. History of prostate cancer, seizures, hyperlipidemia. EXAM: MRI HEAD WITHOUT CONTRAST TECHNIQUE: Multiplanar, multiecho pulse sequences of the brain and surrounding structures were obtained without intravenous contrast. COMPARISON:  CT HEAD December 01, 2016 at 1341 hours and MRI of the head February 23, 2005 FINDINGS: Multiple sequences are moderate to severely motion degraded. BRAIN: No reduced diffusion to suggest acute ischemia. No susceptibility artifact to suggest hemorrhage. Moderate to severe ventriculomegaly, disproportionate sulcal effacement at the convexities. No suspicious parenchymal signal, masses or mass effect. No abnormal extra-axial fluid collections. No extra-axial masses though, contrast enhanced sequences would be more sensitive. Limited assessment of the hippocampi due to motion. Generally symmetric size and signal. VASCULAR: Normal major intracranial vascular flow voids present at skull base. SKULL AND UPPER CERVICAL SPINE: No abnormal sellar expansion. No suspicious calvarial bone marrow signal. Craniocervical junction maintained. Cerebellar tonsils at but not below the foramen magnum. SINUSES/ORBITS: Paranasal sinus mucosal thickening. Mastoid air cells are well  aerated. The included ocular globes and orbital contents are non-suspicious. OTHER: None. IMPRESSION: No acute intracranial process on this moderate to severely motion degraded examination. 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 Electronically Signed   By: Elon Alas M.D.   On: 12/01/2016 20:59   Dg Fluoro Guide Lumbar Puncture  Result Date: 12/03/2016 CLINICAL DATA:  Meningitis. EXAM: DIAGNOSTIC LUMBAR PUNCTURE UNDER FLUOROSCOPIC GUIDANCE FLUOROSCOPY TIME:  Fluoroscopy Time:  0.4 minutes Radiation Exposure Index (if provided by the fluoroscopic device): 2.3 mGy Number of Acquired Spot Images: 0 PROCEDURE: Informed consent was obtained from the patient's wife prior to the procedure via telephone. The procedure was also explained to the patient. With the patient prone, the lower back was prepped with Betadine. 1% Lidocaine was used for local anesthesia. Lumbar puncture was performed in 1 pass at the  L5-S1 level using a 20 gauge needle with return of clear CSF. Opening pressure was requested and the patient was rolled left lateral decubitus with difficulty due to his inability to cooperate and spinal stiffness. Opening pressure was at most 5 cm water. Patient motion when decubitus resulted in loss of subarachnoid space access. The patient was returned prone and the needle was manipulated including repeat thecal sac puncture at L5-S1 which was unsuccessful. Soft tissue numbing was then performed at the L4-5 level and lumbar puncture was performed. The epidural space was clearly entered but tap was dry. 2 Ml of CSF were obtained for laboratory studies. The patient tolerated the procedure well and there were no apparent complications. IMPRESSION: 1. Successful lumbar puncture at L5-S1. Opening pressure was at most 5 cm water. 2. Low volume collection(2 cc) as subarachnoid space access was lost in left lateral decubitus position (which was required for opening pressure). Repeat puncture at  L5-S1 and L4-5 was dry. Electronically Signed   By: Monte Fantasia M.D.   On: 12/03/2016 12:42    Time Spent in minutes  25   Louellen Molder M.D on 12/07/2016 at 1:27 PM  Between 7am to 7pm - Pager - [phone]  After 7pm go to [email or web address] - password [facility]  [facility] -  Office  [phone]

## 2016-12-07 NOTE — Progress Notes (Signed)
Inpatient Rehabilitation  I met briefly with the patient at the bedside and provided informational booklets regarding the IP Rehab program. Pt. not able to engage in a meaningful discussion of rehab needs due to his medical condition.   At daughter's Santiago Glad) request, I phoned and spoke at length with her about the CIR program.  I made her aware that unfortunately it would be tomorrow before we could receive an insurance decision for possible IP Rehab due to [facility] Medicare being closed today for [facility] holiday.  She expressed understanding.  I will follow for possible IP Rehab pending insurance approval, medical readiness and bed availability. Please call if questions.

## 2016-12-07 NOTE — Progress Notes (Signed)
CSW continuing to follow- gave patient dtr, Santiago Glad, bed offers for SNF if CIR unable to accept  Jorge Ny, [place] Social Worker [phone]

## 2016-12-07 NOTE — Evaluation (Signed)
Occupational Therapy Evaluation Patient Details Name: Lance Todd MRN: [id number] DOB: [birth year] Today's Date: 12/07/2016    History of Present Illness 76 y.o. male presented to the ED from home with AMS. Had seizure while in ED this admission.Further testeing determinted acute metabolic encephalopathy/aseptic meningitis with seizures. PMH significant for BPH/prostate cancer status post surgical resectio, HLD, and PTSD. Patient is former [employer] conservation officer.   Clinical Impression   This 76 yo male admitted with above presents to acute OT with deficits below (see OT problem list) thus affecting his PLOF of what is believed to be independent with basic ADLs. All of pt's movements and answers are delayed. He will continue to benefit from acute OT with follow up OT on CIR.    Follow Up Recommendations  CIR;Supervision/Assistance - 24 hour    Equipment Recommendations  Other (comment) (TBD next venue)       Precautions / Restrictions Precautions Precautions: Fall Restrictions Weight Bearing Restrictions: No      Mobility Bed Mobility Overal bed mobility: Needs Assistance Bed Mobility: Supine to Sit     Supine to sit: Min assist     General bed mobility comments: Pt up in recliner upon arrival  Transfers Overall transfer level: Needs assistance Equipment used: Rolling walker (2 wheeled) Transfers: Sit to/from [facility] Sit to Stand: Mod assist Stand pivot transfers: Mod assist       General transfer comment: Pt required repeated v/c for hand placement on recliner safe transfer.    Balance Overall balance assessment: Needs assistance Sitting-balance support: Feet supported;No upper extremity supported Sitting balance-Leahy Scale: Good Sitting balance - Comments: with feet unsupported, pt displayed poor sitting balance.    Standing balance support: Bilateral upper extremity supported;During functional activity Standing balance-Leahy Scale: Poor Standing  balance comment: uable to maintain standing without UE support                            ADL Overall ADL's : Needs assistance/impaired Eating/Feeding: Supervision/ safety;Set up (sitting in recliner)   Grooming: Set up;Supervision/safety (sitting in recliner)   Upper Body Bathing: Supervision/ safety;Set up (sitting in recliner)   Lower Body Bathing: Moderate assistance;Sit to/from stand   Upper Body Dressing : Moderate assistance (sitting in recliner)   Lower Body Dressing: Moderate assistance;Sit to/from stand   Toilet Transfer: Moderate assistance;Stand-pivot   Toileting- Clothing Manipulation and Hygiene: Maximal assistance (Mod A for standing balance)               Vision Additional Comments: Attempted visual assessment but pt having difficulty following commands for testing, will need to continue to monitor vision during treatment sessions          Pertinent Vitals/Pain Pain Assessment: No/denies pain     Hand Dominance Right   Extremity/Trunk Assessment Upper Extremity Assessment Upper Extremity Assessment: Generalized weakness           Communication Communication Communication: No difficulties   Cognition Arousal/Alertness: Awake/alert Behavior During Therapy: Flat affect Overall Cognitive Status: Impaired/Different from baseline Area of Impairment: Orientation;Following commands;Safety/judgement;Problem solving Orientation Level: Disoriented to;Situation (Also said it was 2017 and Sunday (when cued that both were the next one he was able to tell me 2018 and Monday)--responses very slow)   Memory: Decreased short-term memory Following Commands: Follows one step commands with increased time Safety/Judgement: Decreased awareness of safety;Decreased awareness of deficits   Problem Solving: Slow processing;Decreased initiation;Difficulty sequencing;Requires verbal cues;Requires tactile cues General Comments: Pt doffed  socks, but when  attempting to don them he would get them up over his forefoot but then could not problem solve why he was not able to get them up over his ankle (he was pressing to hard on his forefoot to be able to pull sock up). Had him attempt to read 2 sentences and it took him over a minute to attempt the first one--he kept going back and repeating the same part of the sentence he had just read.              Home Living Family/patient expects to be discharged to:: Private residence Living Arrangements: Spouse/significant other Available Help at Discharge: Family;Available 24 hours/day Type of Home: House Home Access: Stairs to enter [employer] of Steps: 5 Entrance Stairs-Rails: None Home Layout: One level     Bathroom Shower/Tub: Teacher, early years/pre: Standard     Home Equipment: None          Prior Functioning/Environment Level of Independence: Independent        Comments: Active. Still drives.        OT Problem List: Impaired balance (sitting and/or standing);Impaired vision/perception;Decreased cognition;Decreased safety awareness   OT Treatment/Interventions: Self-care/ADL training;Therapeutic activities;Cognitive remediation/compensation;Visual/perceptual remediation/compensation;Patient/family education;DME and/or AE instruction;Balance training    OT Goals(Current goals can be found in the care plan section) Acute Rehab OT Goals Patient Stated Goal: wanted to get warm OT Goal Formulation: With patient Time For Goal Achievement: 12/21/16 Potential to Achieve Goals: Good  OT Frequency: Min 3X/week              End of Session Equipment Utilized During Treatment: Gait belt  Activity Tolerance: Patient tolerated treatment well Patient left: in chair;with call bell/phone within reach;with chair alarm set (and remote sitter)   Time: [id number] OT Time Calculation (min): 26 min Charges:  OT General Charges $OT Visit: 1 Procedure OT  Evaluation $OT Eval Moderate Complexity: 1 Procedure OT Treatments $Self Care/Home Management : 8-22 mins  Almon Register [id number] 12/07/2016, 11:44 AM

## 2016-12-08 ENCOUNTER — Encounter (HOSPITAL_COMMUNITY): Payer: Self-pay | Admitting: Physical Medicine and Rehabilitation

## 2016-12-08 DIAGNOSIS — B009 Herpesviral infection, unspecified: Secondary | ICD-10-CM | POA: Diagnosis present

## 2016-12-08 DIAGNOSIS — N183 Chronic kidney disease, stage 3 unspecified: Secondary | ICD-10-CM | POA: Diagnosis present

## 2016-12-08 DIAGNOSIS — G030 Nonpyogenic meningitis: Secondary | ICD-10-CM

## 2016-12-08 DIAGNOSIS — B003 Herpesviral meningitis: Principal | ICD-10-CM

## 2016-12-08 DIAGNOSIS — N179 Acute kidney failure, unspecified: Secondary | ICD-10-CM | POA: Diagnosis present

## 2016-12-08 LAB — BASIC METABOLIC PANEL
Anion gap: 10 (ref 5–15)
BUN: 15 mg/dL (ref 6–20)
CO2: 25 mmol/L (ref 22–32)
Calcium: 8.8 mg/dL — ABNORMAL LOW (ref 8.9–10.3)
Chloride: 107 mmol/L (ref 101–111)
Creatinine, Ser: 1.7 mg/dL — ABNORMAL HIGH (ref 0.61–1.24)
GFR calc Af Amer: 43 mL/min — ABNORMAL LOW (ref >60)
GFR, EST NON AFRICAN AMERICAN: 37 mL/min — AB (ref >60)
Glucose, Bld: 97 mg/dL (ref 65–99)
POTASSIUM: 3.4 mmol/L — AB (ref 3.5–5.1)
SODIUM: 142 mmol/L (ref 135–145)

## 2016-12-08 LAB — HSV(HERPES SMPLX)ABS-I+II(IGG+IGM)-BLD
HSV 1 Glycoprotein G Ab, IgG: 43.9 index — ABNORMAL HIGH (ref 0.00–0.90)
HSV 2 GLYCOPROTEIN G AB, IGG: 3.18 {index} — AB (ref 0.00–0.90)
HSVI/II Comb IgM: <0.91 Ratio (ref 0.00–0.90)

## 2016-12-08 MED ORDER — LACOSAMIDE 50 MG PO TABS: 50.0000 mg | ORAL_TABLET | Freq: Two times a day (BID) | ORAL | 0 refills | Status: DC

## 2016-12-08 MED ORDER — ACYCLOVIR 800 MG PO TABS
800.0000 mg | ORAL_TABLET | Freq: Two times a day (BID) | ORAL | Status: DC
  Filled 2016-12-08: qty 1

## 2016-12-08 MED ORDER — POTASSIUM CHLORIDE CRYS ER 20 MEQ PO TBCR
40.0000 meq | EXTENDED_RELEASE_TABLET | Freq: Once | ORAL | Status: AC
  Administered 2016-12-08: 40 meq via ORAL
  Filled 2016-12-08: qty 2

## 2016-12-08 MED ORDER — ACYCLOVIR 800 MG PO TABS: 800.0000 mg | ORAL_TABLET | Freq: Two times a day (BID) | ORAL | 0 refills | Status: AC

## 2016-12-08 MED ORDER — QUETIAPINE FUMARATE 50 MG PO TABS: 50.0000 mg | ORAL_TABLET | Freq: Every day | ORAL | 0 refills | Status: DC

## 2016-12-08 NOTE — Progress Notes (Signed)
CSW is following for admission to SNF if CIR does not get approval- per CIR pt got initial denial for CIR and they are trying to appeal  CSW spoke with dtr, Santiago Glad, and confirmed SNF choice for [facility] for today- informed dtr we would have to move forward with SNF placement and can't wait to hear about appeal until late- dtr expressed understanding but continues to hold out hope for CIR approval  CSW will continue to follow- Miquel Dunn to fax paperwork to dtr to sign  Jorge Ny, [place] Social Worker [phone]

## 2016-12-08 NOTE — Discharge Summary (Signed)
Physician Discharge Summary  Lance Todd [id number] DOB: 1941/06/09 DOA: 12/01/2016  PCP: follows at [facility] date: 12/01/2016 Discharge date: 12/08/2016  Admitted From: Home Disposition:  SNF  Recommendations for Outpatient Follow-up:  #1 Ambulatory referral to neurology. As per [place] law patient is recommended not to drive any vehicle, operating any heavy machinery, or do any water activities until seizure free for at least 6-12 months or cleared by outpatient neurology.  #2 Patient will complete total 2 weeks of acyclovir for HSV meningitis on 12/12/2016.  #3 follow-up with neurosurgery Dr. Trenton Gammon in 4 weeks.   Home Health: None Equipment/Devices: As per PT at rehabilitation  Discharge Condition: SNF CODE STATUS: Full code Diet recommendation: Regular     Discharge Diagnoses:  Principal Problem:   Aseptic /herpes simplex meningitis   Active Problems:   Seizure ([place])   Acute toxic/metabolic encephalopathy   Hyperglycemia   Depression   Prostate cancer (HCC)   HLD (hyperlipidemia)   Insomnia   Hypophosphatemia   History of prostate cancer   Benign prostatic hyperplasia   PTSD (post-traumatic stress disorder)   HSV (herpes simplex virus) infection   Acute renal failure superimposed on stage 3 chronic kidney disease (HCC)  Brief narrative/history of present illness 76 year old male with history of BPH/prostate cancer status post resection, depression, hyperlipidemia and PTSD presented with 2-3 days of increasing confusion. No recent fevers or chills. No change in his medications recently. In the ED was found to have a witnessed tonic clonic seizures and was given 2 mg IV Ativan. Admitted to hospitalist service.  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 Neurology consulted and Patient placed on continuous EEG monitoring. Given persistent encephalopathy he was started on empiric treatment  for meningitis.  Hospital course  Active Problems: Acute metabolic encephalopathy/aseptic versus herpes simplex meningitis with seizures  Cultures remain negative. Cryptococcal antigen negative. Elevated protein on CSF which could be due to aseptic meningitis. Symptoms continue to improve. Serum HSV antigen was positive. On acyclovir. Will treat for total 2 weeks.     Seizure ([place]) No further seizure activity. Started on Vimpat by neurology. Patient instructed that he cannot drive or operate heavy machinery until cleared by outpatient neurologist. Video EEG monitoring negative for seizure activity and shows diffuse slowing. Polypharmacy is another concern (trazodone, Seroquel and Zoloft: Held all). Urine drug screen unremarkable. TSH, RPR, HIV antibody , thiamine and ammonia normal.   acute on chronic kidney injury UA unremarkable. Family reports patient has chronic kidney disease stage III. No baseline renal function known. Improving  renal function with hydration (I suspect he is at baseline). Follows at the [place].    Depression/PTSD Minimize his home medications suspecting polypharmacy may be contributing to his encephalopathy. Reduced dose of Seroquel, continue prazosin (for PTSD). Discontinue Zoloft and  Prostate cancer with history of resection. Resume Flomax.  Hypophosphatemia/hypokalemia Replenished  ? 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 Should not cause seizures. Per daughter patient has shuffling gait with slow confusion for past 1 year. Discussed with neurosurgery Dr. Trenton Gammon recommends outpatient follow-up with him in 3-4 weeks. (He spoke with family on the phone).    Family Communication  :  Discussed at length with patient's sons and daughters on the phone (Dr. Santiago Glad is the primary contact. Phone [phone])  Disposition Plan  :  sinus  Consults  :   Neurology Neurosurgery (Dr. Trenton Gammon on the phone)  Procedures  :  MRI brain EEG  Discharge  Instructions  Discharge Instructions    Ambulatory referral to Neurology    Complete by:  As directed    An appointment is requested in approximately: 4 weeks     Allergies as of 12/08/2016   No Known Allergies     Medication List    STOP taking these medications   sertraline 100 MG tablet Commonly known as:  ZOLOFT   traZODone 150 MG tablet Commonly known as:  DESYREL     TAKE these medications   acyclovir 800 MG tablet Commonly known as:  ZOVIRAX Take 1 tablet (800 mg total) by mouth 2 (two) times daily.   Carboxymethylcellulose Sod PF 0.25 % Soln Place 1 drop into both eyes daily.   cetirizine 10 MG tablet Commonly known as:  ZYRTEC Take 10 mg by mouth daily as needed for allergies.   docusate sodium 100 MG capsule Commonly known as:  COLACE Take 200 mg by mouth daily.   lacosamide 50 MG Tabs tablet Commonly known as:  VIMPAT Take 1 tablet (50 mg total) by mouth 2 (two) times daily.   omega-3 acid ethyl esters 1 g capsule Commonly known as:  LOVAZA Take 1 g by mouth daily.   pravastatin 10 MG tablet Commonly known as:  PRAVACHOL Take 10 mg by mouth at bedtime.   prazosin 2 MG capsule Commonly known as:  MINIPRESS Take 2 mg by mouth at bedtime.   QUEtiapine 50 MG tablet Commonly known as:  SEROQUEL Take 1 tablet (50 mg total) by mouth at bedtime. What changed:  medication strength  how much to take   tamsulosin 0.4 MG Caps capsule Commonly known as:  FLOMAX Take 0.8 mg by mouth at bedtime.   Vitamin D-3 1000 units Caps Take 1 capsule by mouth daily.      Follow-up Information    POOL,Aashrith A, MD. Schedule an appointment as soon as possible for a visit in 4 week(s).   Specialty:  Neurosurgery Contact information: [address] [address] [place] [address] [phone]          No Known Allergies    Procedures/Studies: Dg Chest 1 View  Result Date: 12/01/2016 CLINICAL DATA:  Pt came in with confusion, that started  today, he does not recall any injury, no chest complaints, came from ct, non smoker EXAM: CHEST 1 VIEW COMPARISON:  None. FINDINGS: Normal mediastinum and cardiac silhouette. Normal pulmonary vasculature. No evidence of effusion, infiltrate, or pneumothorax. No acute bony abnormality. IMPRESSION: No acute cardiopulmonary process. Electronically Signed   By: Suzy Bouchard M.D.   On: 12/01/2016 14:03   Ct Head Wo Contrast  Result Date: 12/01/2016 CLINICAL DATA:  Confusion EXAM: CT HEAD WITHOUT CONTRAST TECHNIQUE: Contiguous axial images were obtained from the base of the skull through the vertex without intravenous contrast. COMPARISON:  MRI 02/23/2005 FINDINGS: Brain: No acute territorial infarction or intracranial hemorrhage is visualized. Mildly enlarged ventricles since the prior exam. No significant atrophy. No focal mass, midline shift or mass effect. Vascular: No hyperdense vessels. Mild carotid artery calcifications. Skull: Mastoids are clear.  No skull fracture Sinuses/Orbits: Mucosal thickening within the ethmoids and maxillary sinuses with small fluid levels in the maxillary sinuses. No acute orbital abnormality. Other: None IMPRESSION: 1. No CT evidence for intracranial hemorrhage or large vessel territorial infarct or intracranial mass 2. Ventricles are slightly enlarged since the prior exam. Mild atrophy is present, however ventricular size appears somewhat disproportionate to atrophy, this raises the possibility  of normal pressure hydrocephalus. Clinical correlation is required. Electronically Signed   By: Donavan Foil M.D.   On: 12/01/2016 14:06   Mr Brain Wo Contrast  Result Date: 12/01/2016 CLINICAL DATA:  A few days of increasing and confusion, obtunded. Recent seizure. History of prostate cancer, seizures, hyperlipidemia. EXAM: MRI HEAD WITHOUT CONTRAST TECHNIQUE: Multiplanar, multiecho pulse sequences of the brain and surrounding structures were obtained without intravenous contrast.  COMPARISON:  CT HEAD December 01, 2016 at 1341 hours and MRI of the head February 23, 2005 FINDINGS: Multiple sequences are moderate to severely motion degraded. BRAIN: No reduced diffusion to suggest acute ischemia. No susceptibility artifact to suggest hemorrhage. Moderate to severe ventriculomegaly, disproportionate sulcal effacement at the convexities. No suspicious parenchymal signal, masses or mass effect. No abnormal extra-axial fluid collections. No extra-axial masses though, contrast enhanced sequences would be more sensitive. Limited assessment of the hippocampi due to motion. Generally symmetric size and signal. VASCULAR: Normal major intracranial vascular flow voids present at skull base. SKULL AND UPPER CERVICAL SPINE: No abnormal sellar expansion. No suspicious calvarial bone marrow signal. Craniocervical junction maintained. Cerebellar tonsils at but not below the foramen magnum. SINUSES/ORBITS: Paranasal sinus mucosal thickening. Mastoid air cells are well aerated. The included ocular globes and orbital contents are non-suspicious. OTHER: None. IMPRESSION: No acute intracranial process on this moderate to severely motion degraded examination. 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 Electronically Signed   By: Elon Alas M.D.   On: 12/01/2016 20:59   Dg Fluoro Guide Lumbar Puncture  Result Date: 12/03/2016 CLINICAL DATA:  Meningitis. EXAM: DIAGNOSTIC LUMBAR PUNCTURE UNDER FLUOROSCOPIC GUIDANCE FLUOROSCOPY TIME:  Fluoroscopy Time:  0.4 minutes Radiation Exposure Index (if provided by the fluoroscopic device): 2.3 mGy Number of Acquired Spot Images: 0 PROCEDURE: Informed consent was obtained from the patient's wife prior to the procedure via telephone. The procedure was also explained to the patient. With the patient prone, the lower back was prepped with Betadine. 1% Lidocaine was used for local anesthesia. Lumbar puncture was performed in 1 pass at the L5-S1 level using a 20  gauge needle with return of clear CSF. Opening pressure was requested and the patient was rolled left lateral decubitus with difficulty due to his inability to cooperate and spinal stiffness. Opening pressure was at most 5 cm water. Patient motion when decubitus resulted in loss of subarachnoid space access. The patient was returned prone and the needle was manipulated including repeat thecal sac puncture at L5-S1 which was unsuccessful. Soft tissue numbing was then performed at the L4-5 level and lumbar puncture was performed. The epidural space was clearly entered but tap was dry. 2 Ml of CSF were obtained for laboratory studies. The patient tolerated the procedure well and there were no apparent complications. IMPRESSION: 1. Successful lumbar puncture at L5-S1. Opening pressure was at most 5 cm water. 2. Low volume collection(2 cc) as subarachnoid space access was lost in left lateral decubitus position (which was required for opening pressure). Repeat puncture at L5-S1 and L4-5 was dry. Electronically Signed   By: Monte Fantasia M.D.   On: 12/03/2016 12:42       Subjective: No overnight issues.  Discharge Exam: Vitals:   12/08/16 0050 12/08/16 0539  BP: 123/85 (!) 140/59  Pulse: 68 (!) 56  Resp: 20 18  Temp:  97.3 F (36.3 C)   Vitals:   12/07/16 0939 12/07/16 1424 12/08/16 0050 12/08/16 0539  BP: (!) 155/71 (!) 145/62 123/85 (!) 140/59  Pulse:  73 68 (!)  56  Resp:  17 20 18   Temp:  97.6 F (36.4 C)  97.3 F (36.3 C)  TempSrc:    Oral  SpO2:  100%  100%  Weight:      Height:         Gen:Not in distress HEENT:  moist mucosa, supple neck Chest: clear b/l, no added sounds CVS: N S1&S2, no murmurs, GI: soft, NT, ND,  Musculoskeletal: warm, no edema CNS: alert and awake, oriented 2-3   The results of significant diagnostics from this hospitalization (including imaging, microbiology, ancillary and laboratory) are listed below for reference.     Microbiology: Recent  Results (from the past 240 hour(s))  CSF culture     Status: None   Collection Time: 12/03/16 12:19 PM  Result Value Ref Range Status   Specimen Description CSF  Final   Special Requests TUBE 1  Final   Gram Stain   Final    WBC PRESENT, PREDOMINANTLY MONONUCLEAR NO ORGANISMS SEEN CYTOSPIN SMEAR    Culture NO GROWTH 3 DAYS  Final   Report Status 12/06/2016 FINAL  Final  Fungus Culture With Stain     Status: None (Preliminary result)   Collection Time: 12/03/16 12:19 PM  Result Value Ref Range Status   Fungus Stain Final report  Final    Comment: (NOTE) Performed At: [facility] [place], [place] [id number] Lindon Romp MD [id number]    Fungus (Mycology) Culture PENDING  Incomplete   Fungal Source CSF  Final    Comment:  TUBE 1  Fungus Culture Result     Status: None   Collection Time: 12/03/16 12:19 PM  Result Value Ref Range Status   Result 1 Comment  Final    Comment: (NOTE) KOH/Calcofluor preparation:  no fungus observed. Performed At: [facility] [place], [place] [id number] Lindon Romp MD [id number]      Labs: BNP (last 3 results) No results for input(s): BNP in the last 8760 hours. Basic Metabolic Panel:  Recent Labs Lab 12/01/16 1802 12/02/16 0837 12/03/16 0806 12/04/16 0651 12/06/16 0650 12/08/16 0520  NA  --  140 140 142 144 142  K  --  3.9 3.7 3.8 3.7 3.4*  CL  --  110 110 111 110 107  CO2  --  22 21* 23 26 25   GLUCOSE  --  117* 97 80 108* 97  BUN  --  17 14 10 10 15   CREATININE  --  1.92* 1.67* 1.63* 1.72* 1.70*  CALCIUM  --  8.7* 8.2* 8.3* 8.6* 8.8*  MG 2.3  --   --   --   --   --   PHOS 1.6*  --   --   --   --   --    Liver Function Tests:  Recent Labs Lab 12/01/16 1336 12/02/16 0837  AST 18 25  ALT 15* 16*  ALKPHOS 41 37*  BILITOT 0.6 0.9  PROT 7.2 7.0  ALBUMIN 3.4* 3.3*   No results for input(s): LIPASE, AMYLASE in the last 168 hours.  Recent Labs Lab 12/01/16 1334   AMMONIA 21   CBC:  Recent Labs Lab 12/01/16 1336 12/02/16 0837  WBC 5.1 8.2  NEUTROABS 3.7  --   HGB 11.6* 11.0*  HCT 32.7* 30.8*  MCV 80.1 80.4  PLT 171 183   Cardiac Enzymes:  Recent Labs Lab 12/01/16 1802  CKTOTAL 113  TROPONINI <0.03   BNP: Invalid input(s): POCBNP CBG:  No results for input(s): GLUCAP in the last 168 hours. D-Dimer No results for input(s): DDIMER in the last 72 hours. Hgb A1c No results for input(s): HGBA1C in the last 72 hours. Lipid Profile No results for input(s): CHOL, HDL, LDLCALC, TRIG, CHOLHDL, LDLDIRECT in the last 72 hours. Thyroid function studies No results for input(s): TSH, T4TOTAL, T3FREE, THYROIDAB in the last 72 hours.  Invalid input(s): FREET3 Anemia work up No results for input(s): VITAMINB12, FOLATE, FERRITIN, TIBC, IRON, RETICCTPCT in the last 72 hours. Urinalysis    Component Value Date/Time   COLORURINE YELLOW 12/01/2016 1326   APPEARANCEUR HAZY (A) 12/01/2016 1326   LABSPEC 1.015 12/01/2016 1326   PHURINE 5.0 12/01/2016 1326   GLUCOSEU NEGATIVE 12/01/2016 1326   HGBUR NEGATIVE 12/01/2016 1326   BILIRUBINUR NEGATIVE 12/01/2016 1326   KETONESUR NEGATIVE 12/01/2016 1326   PROTEINUR 100 (A) 12/01/2016 1326   NITRITE NEGATIVE 12/01/2016 1326   LEUKOCYTESUR NEGATIVE 12/01/2016 1326   Sepsis Labs Invalid input(s): PROCALCITONIN,  WBC,  LACTICIDVEN Microbiology Recent Results (from the past 240 hour(s))  CSF culture     Status: None   Collection Time: 12/03/16 12:19 PM  Result Value Ref Range Status   Specimen Description CSF  Final   Special Requests TUBE 1  Final   Gram Stain   Final    WBC PRESENT, PREDOMINANTLY MONONUCLEAR NO ORGANISMS SEEN CYTOSPIN SMEAR    Culture NO GROWTH 3 DAYS  Final   Report Status 12/06/2016 FINAL  Final  Fungus Culture With Stain     Status: None (Preliminary result)   Collection Time: 12/03/16 12:19 PM  Result Value Ref Range Status   Fungus Stain Final report  Final     Comment: (NOTE) Performed At: [facility] [place], [place] [id number] Lindon Romp MD [id number]    Fungus (Mycology) Culture PENDING  Incomplete   Fungal Source CSF  Final    Comment:  TUBE 1  Fungus Culture Result     Status: None   Collection Time: 12/03/16 12:19 PM  Result Value Ref Range Status   Result 1 Comment  Final    Comment: (NOTE) KOH/Calcofluor preparation:  no fungus observed. Performed At: [facility] [place], [place] [id number] Lindon Romp MD [id number]      Time coordinating discharge: Over 30 minutes  SIGNED:   Louellen Molder, MD  [facility] 12/08/2016, 11:54 AM Pager   If 7PM-7AM, please contact night-coverage [email or web address] Password [email or web address]

## 2016-12-08 NOTE — Progress Notes (Signed)
Allergies as of 12/08/2016   No Known Allergies     Medication List    STOP taking these medications   sertraline 100 MG tablet Commonly known as:  ZOLOFT   traZODone 150 MG tablet Commonly known as:  DESYREL     TAKE these medications   acyclovir 800 MG tablet Commonly known as:  ZOVIRAX Take 1 tablet (800 mg total) by mouth 2 (two) times daily.   Carboxymethylcellulose Sod PF 0.25 % Soln Place 1 drop into both eyes daily.   cetirizine 10 MG tablet Commonly known as:  ZYRTEC Take 10 mg by mouth daily as needed for allergies.   docusate sodium 100 MG capsule Commonly known as:  COLACE Take 200 mg by mouth daily.   lacosamide 50 MG Tabs tablet Commonly known as:  VIMPAT Take 1 tablet (50 mg total) by mouth 2 (two) times daily.   omega-3 acid ethyl esters 1 g capsule Commonly known as:  LOVAZA Take 1 g by mouth daily.   pravastatin 10 MG tablet Commonly known as:  PRAVACHOL Take 10 mg by mouth at bedtime.   prazosin 2 MG capsule Commonly known as:  MINIPRESS Take 2 mg by mouth at bedtime.   QUEtiapine 50 MG tablet Commonly known as:  SEROQUEL Take 1 tablet (50 mg total) by mouth at bedtime. What changed:  medication strength  how much to take   tamsulosin 0.4 MG Caps capsule Commonly known as:  FLOMAX Take 0.8 mg by mouth at bedtime.   Vitamin D-3 1000 units Caps Take 1 capsule by mouth daily.     L[facility] Todd to be D/C'd Rehab per MD order.  Discussed with the patient and all questions fully answered.  VSS, Skin clean, dry and intact without evidence of skin break down, no evidence of skin tears noted. IV catheter discontinued intact. Site without signs and symptoms of complications. Dressing and pressure applied.  An After Visit Summary was printed and given to the patient. Patient received prescription.   D/c education completed with patient/family including follow up instructions, medication list, d/c activities limitations if indicated, with  other d/c instructions as indicated by MD - patient able to verbalize understanding, all questions fully answered.   Patient instructed to return to ED, call 911, or call MD for any changes in condition.   Patient escorted via [place], and D/C home via private auto.  Lance Todd 12/08/2016 5:36 PM

## 2016-12-08 NOTE — Care Management Important Message (Signed)
Important Message  Patient Details  Name: Lance Todd MRN: [id number] Date of Birth: 07/03/1941   Medicare Important Message Given:  Yes    [name] May 12/08/2016, 12:41 PM

## 2016-12-08 NOTE — Progress Notes (Signed)
Inpatient Rehabilitation  I received an insurance denial earlier today.  I phoned daughter Santiago Glad to notify her.  Santiago Glad requested an appeal.  Have discussed with rehab physician as well as Tammy Sours, CSW and [employer], RNCM.  If we pursue an appeal, we would not receive a decision on the appeal today.  Roselyn Reef and myself had a conference call with daughter in the patient's room along with pt's wife and sister in law.  Family understands that pt. has been discharged and will not be awaiting an appeal process to see if the insurance decision can be overturned.  I will sign off.    [facility] Admissions Coordinator Cell [phone] Office [phone]

## 2016-12-08 NOTE — Discharge Instructions (Signed)
    Epilepsy Epilepsy is when a person keeps having seizures. A seizure is unusual activity in the brain. A seizure can change how you think or behave, and it can make it hard to be aware of what is happening. This condition can cause problems, such as:  Falls, accidents, and injury.  Depression.  Poor memory.  Sudden unexplained death in epilepsy (SUDEP). This is rare. Its cause is not known. Most people with epilepsy lead normal lives. Follow these instructions at home: Medicines  Take medicines only as told by your doctor.  Avoid anything that may keep your medicine from working, such as alcohol. Activity  Get enough rest. Lack of sleep can make seizures more likely to occur.  Follow your doctor's advice about driving, swimming, and doing anything else that would be dangerous if you had a seizure. Teaching others  Teach friends and family what to do if you have a seizure. They should:  Lay you on the ground to prevent a fall.  Cushion your head and body.  Loosen any tight clothing around your neck.  Turn you on your side.  Stay with you until you are better.  Not hold you down.  Not put anything in your mouth.  Know whether or not you need emergency care. General instructions  Avoid anything that causes you to have seizures.  Keep a seizure diary. Write down what you remember about each seizure, and especially what might have caused it.  Keep all follow-up visits as told by your doctor. This is important. Contact a doctor if:  You have a change in your seizure pattern.  You get an infection or start to feel sick. You may have more seizures when you are sick. Get help right away if:  A seizure does not stop after 5 minutes.  You have more than one seizure in a row, and you do not have enough time between the seizures to feel better.  A seizure makes it harder to breathe.  A seizure is different from other seizures you have had.  A seizure makes you  unable to speak or use a part of your body.  You did not wake up right after a seizure. This information is not intended to replace advice given to you by your health care provider. Make sure you discuss any questions you have with your health care provider. Document Released: 09/06/2009 Document Revised: 06/15/2016 Document Reviewed: 05/19/2016 Elsevier Interactive Patient Education  2017 Elsevier Inc.  

## 2016-12-08 NOTE — Progress Notes (Signed)
I am awaiting [facility] Medicare decision regarding an IP Rehab admission.    [facility] Admissions Coordinator Cell [phone] Office [phone]

## 2016-12-08 NOTE — Progress Notes (Signed)
Report called to [facility] at [facility]

## 2016-12-08 NOTE — Progress Notes (Signed)
Patient tachy at low 100's at discharge, family concerned, provider paged.

## 2016-12-09 ENCOUNTER — Other Ambulatory Visit: Payer: Self-pay | Admitting: *Deleted

## 2016-12-09 MED ORDER — LACOSAMIDE 50 MG PO TABS: 50.0000 mg | ORAL_TABLET | Freq: Two times a day (BID) | ORAL | 5 refills | Status: DC

## 2016-12-09 NOTE — Telephone Encounter (Signed)
[facility] [phone] Fax: [phone]  

## 2016-12-11 ENCOUNTER — Non-Acute Institutional Stay (SKILLED_NURSING_FACILITY): Payer: Medicare Other | Admitting: Internal Medicine

## 2016-12-11 ENCOUNTER — Encounter: Payer: Self-pay | Admitting: Internal Medicine

## 2016-12-11 DIAGNOSIS — F4310 Post-traumatic stress disorder, unspecified: Secondary | ICD-10-CM | POA: Diagnosis not present

## 2016-12-11 DIAGNOSIS — G912 (Idiopathic) normal pressure hydrocephalus: Secondary | ICD-10-CM | POA: Diagnosis not present

## 2016-12-11 DIAGNOSIS — N183 Chronic kidney disease, stage 3 unspecified: Secondary | ICD-10-CM

## 2016-12-11 DIAGNOSIS — R569 Unspecified convulsions: Secondary | ICD-10-CM | POA: Diagnosis not present

## 2016-12-11 DIAGNOSIS — G030 Nonpyogenic meningitis: Secondary | ICD-10-CM

## 2016-12-11 DIAGNOSIS — F329 Major depressive disorder, single episode, unspecified: Secondary | ICD-10-CM | POA: Diagnosis not present

## 2016-12-11 DIAGNOSIS — R4189 Other symptoms and signs involving cognitive functions and awareness: Secondary | ICD-10-CM

## 2016-12-11 DIAGNOSIS — D638 Anemia in other chronic diseases classified elsewhere: Secondary | ICD-10-CM

## 2016-12-11 DIAGNOSIS — Z8546 Personal history of malignant neoplasm of prostate: Secondary | ICD-10-CM

## 2016-12-11 DIAGNOSIS — R5381 Other malaise: Secondary | ICD-10-CM | POA: Diagnosis not present

## 2016-12-11 NOTE — Progress Notes (Signed)
LOCATION: [facility]  PCP: No primary care provider on file.   Code Status: Full Code  Goals of care: Advanced Directive information Advanced Directives 12/05/2016  Does Patient Have a Medical Advance Directive? No  Would patient like information on creating a medical advance directive? No - Patient declined       Extended Emergency Contact Information Primary Emergency Contact: [name] Address: [name]  [place] Home Phone: [phone] Relation: None Secondary Emergency Contact: Thurnell Lose Address: [address]          [place] [address] [name] of [place] Phone: [phone] Relation: None   No Known Allergies  Chief Complaint  Patient presents with  . New Admit To SNF    New Admission Visit     HPI:  Patient is a 76 y.o. male seen today for short term rehabilitation post hospital admission from 12/01/2016-12/08/2016 with acute encephalopathy. He was seen by neurology and underwent extensive workup. 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 He was also diagnosed with seizure and started on Vimpat. He had acute on chronic kidney disease and was treated with IV fluids. His psychiatric medications were held and later discontinued with Seroquel being resumed at a lower dose. There was concern for normal pressure hydrocephalus based on imaging studies and neurosurgery had recommended outpatient follow-up. He has medical history of BPH, prostate cancer status post resection, depression, posttraumatic stress disorder, hyperlipidemia among others. He is seen in his room today.  Review of Systems:  Constitutional: Negative for fever, chills. Energy level is slowly coming back. HENT: Negative for headache, congestion, nasal discharge, sore throat, difficulty swallowing.   Eyes: Negative for eye pain, blurred vision, double vision and discharge. Wears glasses. Respiratory:  Negative for cough, shortness of breath and wheezing.   Cardiovascular: Negative for chest pain, palpitations, leg swelling.  Gastrointestinal: Negative for heartburn, nausea, vomiting, abdominal pain, loss of appetite, melena, diarrhea and constipation. Last bowel movement was today.  Genitourinary: Negative for dysuria and flank pain.  Musculoskeletal: Negative for back pain, fall in the facility.  Skin: Negative for itching, rash.  Neurological: Negative for dizziness. Psychiatric/Behavioral: Negative for depression   Past Medical History:  Diagnosis Date  . Cancer determined by prostate biopsy ([place])   . Depression   . Hypercholesterolemia   . 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 PTSD (post-traumatic stress disorder)    Past Surgical History:  Procedure Laterality Date  . PROSTATE SURGERY     Social History:   reports that he has never smoked. He has never used smokeless tobacco. He reports that he does not drink alcohol or use drugs.  Family History  Problem Relation Age of Onset  . Seizures Sister     Medications: Allergies as of 12/11/2016   No Known Allergies     Medication List       Accurate as of 12/11/16  3:41 PM. Always use your most recent med list.          acyclovir 800 MG tablet Commonly known as:  ZOVIRAX Take 1 tablet (800 mg total) by mouth 2 (two) times daily.   Carboxymethylcellulose Sod PF 0.25 % Soln Place 1 drop into both eyes daily.   cetirizine 10 MG tablet Commonly known as:  ZYRTEC Take 10 mg by mouth daily as needed for allergies.   docusate sodium 100  MG capsule Commonly known as:  COLACE Take 200 mg by mouth daily.   lacosamide 50 MG Tabs tablet Commonly known as:  VIMPAT Take 1 tablet (50 mg total) by mouth 2 (two) times daily.   omega-3 acid ethyl esters 1 g capsule Commonly known as:  LOVAZA Take 1 g by mouth daily.   pravastatin 10 MG tablet Commonly known as:   PRAVACHOL Take 10 mg by mouth at bedtime.   prazosin 2 MG capsule Commonly known as:  MINIPRESS Take 2 mg by mouth at bedtime.   QUEtiapine 50 MG tablet Commonly known as:  SEROQUEL Take 1 tablet (50 mg total) by mouth at bedtime.   tamsulosin 0.4 MG Caps capsule Commonly known as:  FLOMAX Take 0.4 mg by mouth at bedtime.   Vitamin D-3 1000 units Caps Take 1 capsule by mouth daily.       Immunizations: Immunization History  Administered Date(s) Administered  . PPD Test 12/08/2016     Physical Exam:  Vitals:   12/11/16 1537  BP: 140/70  Pulse: 94  Resp: 19  Temp: 97.9 F (36.6 C)  TempSrc: Oral  SpO2: 98%  Weight: 171 lb (77.6 kg)  Height: 5' 7.5" (1.715 m)   Body mass index is 26.39 kg/m.  General- elderly male, well built, in no acute distress Head- normocephalic, atraumatic Nose- no maxillary or frontal sinus tenderness, no nasal discharge Throat- moist mucus membrane, has dentures Eyes- PERRLA, EOMI, no pallor, no icterus, no discharge, normal conjunctiva, normal sclera Neck- no cervical lymphadenopathy Cardiovascular- normal s1,s2, no murmur Respiratory- bilateral clear to auscultation, no wheeze, no rhonchi, no crackles, no use of accessory muscles Abdomen- bowel sounds present, soft, non tender Musculoskeletal- able to move all 4 extremities, generalized weakness Neurological- alert and oriented to person, place but not to time Skin- warm and dry Psychiatry- normal mood and affect    Labs reviewed: Basic Metabolic Panel:  Recent Labs  12/01/16 1802  12/04/16 0651 12/06/16 0650 12/08/16 0520  NA  --   < > 142 144 142  K  --   < > 3.8 3.7 3.4*  CL  --   < > 111 110 107  CO2  --   < > 23 26 25   GLUCOSE  --   < > 80 108* 97  BUN  --   < > 10 10 15   CREATININE  --   < > 1.63* 1.72* 1.70*  CALCIUM  --   < > 8.3* 8.6* 8.8*  MG 2.3  --   --   --   --   PHOS 1.6*  --   --   --   --   < > = values in this interval not displayed. Liver  Function Tests:  Recent Labs  12/01/16 1336 12/02/16 0837  AST 18 25  ALT 15* 16*  ALKPHOS 41 37*  BILITOT 0.6 0.9  PROT 7.2 7.0  ALBUMIN 3.4* 3.3*   No results for input(s): LIPASE, AMYLASE in the last 8760 hours.  Recent Labs  12/01/16 1334  AMMONIA 21   CBC:  Recent Labs  12/01/16 1336 12/02/16 0837  WBC 5.1 8.2  NEUTROABS 3.7  --   HGB 11.6* 11.0*  HCT 32.7* 30.8*  MCV 80.1 80.4  PLT 171 183   Cardiac Enzymes:  Recent Labs  12/01/16 1802  CKTOTAL 113  TROPONINI <0.03   BNP: Invalid input(s): POCBNP CBG: No results for input(s): GLUCAP in the last 8760 hours.  Radiological  Exams: Dg Chest 1 View  Result Date: 12/01/2016 CLINICAL DATA:  Pt came in with confusion, that started today, he does not recall any injury, no chest complaints, came from ct, non smoker EXAM: CHEST 1 VIEW COMPARISON:  None. FINDINGS: Normal mediastinum and cardiac silhouette. Normal pulmonary vasculature. No evidence of effusion, infiltrate, or pneumothorax. No acute bony abnormality. IMPRESSION: No acute cardiopulmonary process. Electronically Signed   By: Suzy Bouchard M.D.   On: 12/01/2016 14:03   Ct Head Todd Contrast  Result Date: 12/01/2016 CLINICAL DATA:  Confusion EXAM: CT HEAD WITHOUT CONTRAST TECHNIQUE: Contiguous axial images were obtained from the base of the skull through the vertex without intravenous contrast. COMPARISON:  MRI 02/23/2005 FINDINGS: Brain: No acute territorial infarction or intracranial hemorrhage is visualized. Mildly enlarged ventricles since the prior exam. No significant atrophy. No focal mass, midline shift or mass effect. Vascular: No hyperdense vessels. Mild carotid artery calcifications. Skull: Mastoids are clear.  No skull fracture Sinuses/Orbits: Mucosal thickening within the ethmoids and maxillary sinuses with small fluid levels in the maxillary sinuses. No acute orbital abnormality. Other: None IMPRESSION: 1. No CT evidence for intracranial  hemorrhage or large vessel territorial infarct or intracranial mass 2. Ventricles are slightly enlarged since the prior exam. Mild atrophy is present, however ventricular size appears somewhat disproportionate to atrophy, this raises the possibility of normal pressure hydrocephalus. Clinical correlation is required. Electronically Signed   By: Donavan Foil M.D.   On: 12/01/2016 14:06   [facility] Contrast  Result Date: 12/01/2016 CLINICAL DATA:  A few days of increasing and confusion, obtunded. Recent seizure. History of prostate cancer, seizures, hyperlipidemia. EXAM: MRI HEAD WITHOUT CONTRAST TECHNIQUE: Multiplanar, multiecho pulse sequences of the brain and surrounding structures were obtained without intravenous contrast. COMPARISON:  CT HEAD December 01, 2016 at 1341 hours and MRI of the head February 23, 2005 FINDINGS: Multiple sequences are moderate to severely motion degraded. BRAIN: No reduced diffusion to suggest acute ischemia. No susceptibility artifact to suggest hemorrhage. Moderate to severe ventriculomegaly, disproportionate sulcal effacement at the convexities. No suspicious parenchymal signal, masses or mass effect. No abnormal extra-axial fluid collections. No extra-axial masses though, contrast enhanced sequences would be more sensitive. Limited assessment of the hippocampi due to motion. Generally symmetric size and signal. VASCULAR: Normal major intracranial vascular flow voids present at skull base. SKULL AND UPPER CERVICAL SPINE: No abnormal sellar expansion. No suspicious calvarial bone marrow signal. Craniocervical junction maintained. Cerebellar tonsils at but not below the foramen magnum. SINUSES/ORBITS: Paranasal sinus mucosal thickening. Mastoid air cells are well aerated. The included ocular globes and orbital contents are non-suspicious. OTHER: None. IMPRESSION: No acute intracranial process on this moderate to severely motion degraded examination. Moderate to severe probable normal  pressure hydrocephalus. Electronically Signed   By: Elon Alas M.D.   On: 12/01/2016 20:59   Dg Fluoro Guide Lumbar Puncture  Result Date: 12/03/2016 CLINICAL DATA:  Meningitis. EXAM: DIAGNOSTIC LUMBAR PUNCTURE UNDER FLUOROSCOPIC GUIDANCE FLUOROSCOPY TIME:  Fluoroscopy Time:  0.4 minutes Radiation Exposure Index (if provided by the fluoroscopic device): 2.3 mGy Number of Acquired Spot Images: 0 PROCEDURE: Informed consent was obtained from the patient's wife prior to the procedure via telephone. The procedure was also explained to the patient. With the patient prone, the lower back was prepped with Betadine. 1% Lidocaine was used for local anesthesia. Lumbar puncture was performed in 1 pass at the L5-S1 level using a 20 gauge needle with return of clear CSF. Opening pressure was requested and  the patient was rolled left lateral decubitus with difficulty due to his inability to cooperate and spinal stiffness. Opening pressure was at most 5 cm water. Patient motion when decubitus resulted in loss of subarachnoid space access. The patient was returned prone and the needle was manipulated including repeat thecal sac puncture at L5-S1 which was unsuccessful. Soft tissue numbing was then performed at the L4-5 level and lumbar puncture was performed. The epidural space was clearly entered but tap was dry. 2 Ml of CSF were obtained for laboratory studies. The patient tolerated the procedure well and there were no apparent complications. IMPRESSION: 1. Successful lumbar puncture at L5-S1. Opening pressure was at most 5 cm water. 2. Low volume collection(2 cc) as subarachnoid space access was lost in left lateral decubitus position (which was required for opening pressure). Repeat puncture at L5-S1 and L4-5 was dry. Electronically Signed   By: Monte Fantasia M.D.   On: 12/03/2016 12:42    Assessment/Plan  Physical deconditioning From generalized weakness. Will need to work with physical therapy and  occupational therapy to help regain his strength and restore his balance.  Aseptic herpes simplex meningitis Continue and complete his course of acyclovir on 12/12/2016. Will need neurology follow-up. Monitor for headache and signs of infection.  Seizure Continue Vimpat, remains seizure-free.  PTSD Continue Minipress current regimen and Seroquel 50 mg daily for now. He was on Zoloft and trazodone at home that were discontinued in the hospital because of his acute encephalopathy. Get psychiatry consult.  Chronic depression Mood appears stable this visit. Continue Seroquel 50 mg daily for now and consider tapering it off if tolerated.  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 with unsteady gait and memory impairment. Pending neurosurgery follow-up in 2 weeks. fall precautions to be taken.   Cognitive impairment Impaired and is slow to response. SLP consult  Chronic kidney disease stage III Check BMP periodically. Avoid nephrotoxic agent.  Anemia of chronic disease Monitor CBC  Prostate cancer status post resection Continue Flomax   Goals of care: short term rehabilitation   Labs/tests ordered: CBC, CMP 12/14/2016.  Family/ staff Communication: reviewed care plan with patient and nursing supervisor    Blanchie Serve, MD Internal Medicine [place], [place] [address] Cell Phone (Monday-Friday 8 am - 5 pm): [phone] On Call: [phone] and follow prompts after 5 pm and on weekends Office Phone: [phone] Office Fax: [phone]

## 2016-12-14 ENCOUNTER — Non-Acute Institutional Stay (SKILLED_NURSING_FACILITY): Payer: Medicare Other | Admitting: Family

## 2016-12-14 DIAGNOSIS — N400 Enlarged prostate without lower urinary tract symptoms: Secondary | ICD-10-CM | POA: Diagnosis not present

## 2016-12-14 DIAGNOSIS — F4310 Post-traumatic stress disorder, unspecified: Secondary | ICD-10-CM | POA: Diagnosis not present

## 2016-12-14 MED ORDER — QUETIAPINE FUMARATE 25 MG PO TABS: 25.0000 mg | ORAL_TABLET | Freq: Every day | ORAL | Status: DC

## 2016-12-14 NOTE — Progress Notes (Signed)
Location:  [facility] Room Number: 808 Place of Service:  SNF [phone]) Provider:  Marlowe Sax FNP-C   Extended Emergency Contact Information Primary Emergency Contact: [facility] Address: [address]          [place]  [place] Home Phone: [phone] Relation: None Secondary Emergency Contact: Thurnell Lose Address: [address]          [place] [address] [name] Phone: [phone] Relation: None  Code Status: Full Code  Goals of care: Advanced Directive information Advanced Directives 12/05/2016  Does Patient Have a Medical Advance Directive? No  Would patient like information on creating a medical advance directive? No - Patient declined     Chief Complaint  Patient presents with  . Acute Visit    HPI:  Pt is a 76 y.o. male seen today for an acute visit for evaluation of medication. He has a medical history of Prostate cancer, BPH, Depression, PTSD, Hyperlipidemia, seizure among other conditions. He is seen in his room today per facility Nurse request. He denies any acute issues this visit though HPI and ROS limited due to intermittent confusion.Facility Nurse reports patient's daughter would like medication evaluated.Patient's daughter Santiago Glad later came to visit requested to talk with MD. Facility Nurse supervisor notified MD but was in two other family conferences.MD requested NP to talk with patient's daughter but patient's daughter demanded to see MD " I don't have anything against NP, my sister is a Designer, jewellery in [place] I demand to see the doctor or i'm going to the administrator's office". After the MD meetings NP accompanied Dr. Bubba Camp to see patient's daughter but she had left the room for a conference call.Patient stated daughter had gone downstairs though facility has no downstairs.MD notified the facility Nurse to let patient's daughter know that MD had come to see her but was unavailable  will be back. For the second time facility Nurse Supervisor, MD and NP went to the room patient's daughter had gone for the day. MD called Patient's daughter on her cell phone number that she left with evening shift Nurse [phone] but she did not answer MD left a voicemail for her to call the facility. Later patient's daughter called spoke with MD in presence of NP and evening Nurse Supervisor. She requested patient's Seroquel to be further reduced states thinks patient is taking too much medications that might have resulted to seizures.She states medication was supposed to have been reduced to 25 mg Tablet at the hospital but was reduced to 50 mg Tablet since records indicated 100 mg tablet. However patient's daughter who is a Designer, jewellery went home and reviewed patient's medication noticed that patient was already taking half a tablet at home.Patient's daughter also wonders why patient was on two alpha blockers states read about Minipress realized that it's for B/p and BPH.She would like Minipress discontinued and Flomax continued. MD and NP reviewed medical records notified patient's daughter that currently patient was taking minipress for PTSD that was prescribed at the [facility] hospital.She requested Flomax to be discontinued. Psychiatry referral already requested by MD on prior visit. Patient's daughter also requested Psychiatry evaluate and recommend when patient can be told about recent death of her sister while he was in the hospital.Patient's daughter would like to meet face to face with MD.Both agreed to meet on the Thursday 12/17/2016.   Past Medical History:  Diagnosis Date  . Cancer determined by prostate biopsy ([place])   . Depression   .  Hypercholesterolemia   . 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 PTSD (post-traumatic stress disorder)    Past Surgical History:  Procedure Laterality Date  . PROSTATE SURGERY      No Known  Allergies  Allergies as of 12/14/2016   No Known Allergies     Medication List       Accurate as of 12/14/16  6:53 PM. Always use your most recent med list.          acyclovir 800 MG tablet Commonly known as:  ZOVIRAX Take 1 tablet (800 mg total) by mouth 2 (two) times daily.   atorvastatin 10 MG tablet Commonly known as:  LIPITOR Take 10 mg by mouth daily.   Carboxymethylcellulose Sod PF 0.25 % Soln Place 1 drop into both eyes daily.   cetirizine 10 MG tablet Commonly known as:  ZYRTEC Take 10 mg by mouth daily as needed for allergies.   docusate sodium 100 MG capsule Commonly known as:  COLACE Take 200 mg by mouth daily.   ketotifen 0.025 % ophthalmic solution Commonly known as:  ZADITOR 1 drop 2 (two) times daily.   lacosamide 50 MG Tabs tablet Commonly known as:  VIMPAT Take 1 tablet (50 mg total) by mouth 2 (two) times daily.   omega-3 acid ethyl esters 1 g capsule Commonly known as:  LOVAZA Take 1 g by mouth daily.   prazosin 2 MG capsule Commonly known as:  MINIPRESS Take 2 mg by mouth at bedtime.   QUEtiapine 25 MG tablet Commonly known as:  SEROQUEL Take 1 tablet (25 mg total) by mouth at bedtime.   Vitamin D-3 1000 units Caps Take 1 capsule by mouth daily.       Review of Systems  Constitutional: Negative for appetite change, chills, fatigue and fever.  HENT: Negative for congestion, rhinorrhea, sinus pain, sinus pressure, sneezing and sore throat.   Eyes: Negative.   Respiratory: Negative for cough, chest tightness, shortness of breath and wheezing.   Cardiovascular: Negative for chest pain, palpitations and leg swelling.  Gastrointestinal: Negative for abdominal distention, abdominal pain, constipation, diarrhea, nausea and vomiting.  Endocrine: Negative.   Genitourinary: Negative for dysuria, flank pain and urgency.  Musculoskeletal: Positive for gait problem.  Skin: Negative for color change, pallor and rash.  Neurological: Negative  for dizziness, seizures, syncope, light-headedness and headaches.  Hematological: Does not bruise/bleed easily.  Psychiatric/Behavioral: Negative for agitation, hallucinations and sleep disturbance. The patient is not nervous/anxious.     Immunization History  Administered Date(s) Administered  . PPD Test 12/08/2016   Pertinent  Health Maintenance Due  Topic Date Due  . PNA vac Low Risk Adult (1 of 2 - PCV13) 05/16/2004  . INFLUENZA VACCINE  06/23/2016    Vitals:   12/14/16 1100  BP: (!) 141/85  Pulse: 64  Resp: 18  Temp: 98.4 F (36.9 C)  SpO2: 99%  Weight: 168 lb (76.2 kg)  Height: 5' 7.5" (1.715 m)   Body mass index is 25.92 kg/m. Physical Exam  Constitutional: He appears well-developed and well-nourished. No distress.  Intermittent forgetfulness   HENT:  Head: Normocephalic.  Mouth/Throat: Oropharynx is clear and moist. No oropharyngeal exudate.  Eyes: Conjunctivae and EOM are normal. Pupils are equal, round, and reactive to light. Right eye exhibits no discharge. Left eye exhibits no discharge. No scleral icterus.  Neck: Normal range of motion. No JVD present. No thyromegaly present.  Cardiovascular: Normal rate, regular rhythm, normal heart  sounds and intact distal pulses.  Exam reveals no gallop and no friction rub.   No murmur heard. Pulmonary/Chest: Breath sounds normal. No respiratory distress. He has no wheezes. He has no rales.  Abdominal: Soft. Bowel sounds are normal. He exhibits no distension. There is no tenderness. There is no rebound and no guarding.  Musculoskeletal: He exhibits no edema, tenderness or deformity.  Moves x 4 extremities. Unsteady gait uses FWW.   Lymphadenopathy:    He has no cervical adenopathy.  Neurological: He is alert.  Intermittent forgetfulness  Skin: Skin is warm and dry. No rash noted. No erythema. No pallor.  Psychiatric: He has a normal mood and affect.    Labs reviewed:  Recent Labs  12/01/16 1802  12/04/16 0651  12/06/16 0650 12/08/16 0520  NA  --   < > 142 144 142  K  --   < > 3.8 3.7 3.4*  CL  --   < > 111 110 107  CO2  --   < > 23 26 25   GLUCOSE  --   < > 80 108* 97  BUN  --   < > 10 10 15   CREATININE  --   < > 1.63* 1.72* 1.70*  CALCIUM  --   < > 8.3* 8.6* 8.8*  MG 2.3  --   --   --   --   PHOS 1.6*  --   --   --   --   < > = values in this interval not displayed.  Recent Labs  12/01/16 1336 12/02/16 0837  AST 18 25  ALT 15* 16*  ALKPHOS 41 37*  BILITOT 0.6 0.9  PROT 7.2 7.0  ALBUMIN 3.4* 3.3*    Recent Labs  12/01/16 1336 12/02/16 0837  WBC 5.1 8.2  NEUTROABS 3.7  --   HGB 11.6* 11.0*  HCT 32.7* 30.8*  MCV 80.1 80.4  PLT 171 183   Lab Results  Component Value Date   TSH 1.050 12/01/2016   Lab Results  Component Value Date   HGBA1C 5.5 12/01/2016    Assessment/Plan 1. Benign prostatic hyperplasia without lower urinary tract symptoms D/c Flomax per patient's daughter request. Continue on minipress. Continue to monitor.   2. PTSD (post-traumatic stress disorder) Continue on minipress.Awaiting Psychiatry service consult written by MD on prior visit.      Family/ staff Communication: Reviewed plan of care with Dr. Mikeal Hawthorne, Patient's POA Santiago Glad at [facility].# [phone] and Facility Nurse and Supervisor.   Labs/tests ordered: None   Spent > 40 minutes counseling and discussing plan of care with  Dr. Sharlett Iles, patient, Facility Nurse supervisor; reviewing medical record; labs; and developing future plan of care.

## 2016-12-21 ENCOUNTER — Non-Acute Institutional Stay (SKILLED_NURSING_FACILITY): Payer: Medicare Other | Admitting: Family

## 2016-12-21 DIAGNOSIS — R2681 Unsteadiness on feet: Secondary | ICD-10-CM

## 2016-12-21 DIAGNOSIS — E782 Mixed hyperlipidemia: Secondary | ICD-10-CM | POA: Diagnosis not present

## 2016-12-21 DIAGNOSIS — F4310 Post-traumatic stress disorder, unspecified: Secondary | ICD-10-CM

## 2016-12-21 DIAGNOSIS — R569 Unspecified convulsions: Secondary | ICD-10-CM | POA: Diagnosis not present

## 2016-12-21 DIAGNOSIS — N400 Enlarged prostate without lower urinary tract symptoms: Secondary | ICD-10-CM

## 2016-12-21 NOTE — Progress Notes (Signed)
Location:  [place] Room Number: 808 Place of Service:   SNF [phone])   Provider: Marlowe Sax FNP-C   Extended Emergency Contact Information Primary Emergency Contact: [facility] Address: [address]          [place]  [place] Home Phone: [phone] Relation: None Secondary Emergency Contact: Thurnell Lose Address: [address]          [place] [address] [name] Phone: [phone] Relation: None  Code Status: Full Code  Goals of care:  Advanced Directive information Advanced Directives 12/05/2016  Does Patient Have a Medical Advance Directive? No  Would patient like information on creating a medical advance directive? No - Patient declined     No Known Allergies  Chief Complaint  Patient presents with  . Discharge Note    HPI:  76 y.o. male seen today at  [name] for discharge home.He was here for short term rehabilitation post hospital admission from 12/01/2016-12/08/2016 with acute encephalopathy.He was seen by neurology and underwent extensive workup. 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 He was also diagnosed with seizure and started on Vimpat. He had acute on chronic kidney disease and was treated with IV fluids. His psychiatric medications were held and later discontinued with Seroquel being resumed at a lower dose. There was concern for normal pressure hydrocephalus based on imaging studies and neurosurgery had recommended outpatient follow-up.He has a medical history of Prostate cancer, BPH, Depression, PTSD, Hyperlipidemia, seizure among other conditions.He is seen in his room today. He denies any acute issues this visit.No seizure activity reported during his stay here in rehab.He has worked well with PT/OT now stable for discharge home.He will be discharged home with Home health PT/OT to continue with ROM, Exercise, Gait stability  and muscle strengthening.He will also require HHRN for medication management.He will require  DME: FWW to allow her to maintain current level of independence with ADL's. Home health services will be arranged by facility social worker prior to discharge. Prescription medication will be written x 1 month then patient to follow up with PCP in 1-2 weeks.Facility staff report no new concerns.Patient's discharged instruction discussed with patient's daughter Santiago Glad  on the phone # [phone] with presence of Dr.Pandey and facility Nurse supervisor. Patient's daughter has requested several consults during his stay here. Consults to subspeciality deemed necessary has been provided. She asks about him driving. I have informed her that patient should not drive for at least 6 months for now and will need formal clearance from PCP/ neurology prior to resuming driving. Patient's daughter has questions regarding medications. She has been explained about each med, their indication. Patient to follow up with neurology, urology,neurosurgery and Psychiatry at [facility] upon discharge.She also request for podiatrist. Will defer to PCP. Daughter karen understands this and is able to repeat specific appointment dates.    Past Medical History:  Diagnosis Date  . Cancer determined by prostate biopsy ([place])   . Depression   . Hypercholesterolemia   . 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 PTSD (post-traumatic stress disorder)     Past Surgical History:  Procedure Laterality Date  . PROSTATE SURGERY        reports that he has never smoked. He has never used smokeless tobacco. He reports that he does not drink alcohol or use drugs. Social History   Social History  . Marital status:  Single    Spouse name: N/A  . Number of children: N/A  . Years of education: N/A   Occupational History  . Not on file.   Social History Main Topics  . Smoking status: Never Smoker  . Smokeless  tobacco: Never Used  . Alcohol use No  . Drug use: No  . Sexual activity: Not Currently   Other Topics Concern  . Not on file   Social History Narrative  . No narrative on file    No Known Allergies  Pertinent  Health Maintenance Due  Topic Date Due  . PNA vac Low Risk Adult (1 of 2 - PCV13) 05/16/2004  . INFLUENZA VACCINE  06/23/2016    Medications: Allergies as of 12/21/2016   No Known Allergies     Medication List       Accurate as of 12/21/16  6:41 PM. Always use your most recent med list.          atorvastatin 10 MG tablet Commonly known as:  LIPITOR Take 10 mg by mouth daily.   Carboxymethylcellulose Sod PF 0.25 % Soln Place 1 drop into both eyes daily.   cetirizine 10 MG tablet Commonly known as:  ZYRTEC Take 10 mg by mouth daily as needed for allergies.   docusate sodium 100 MG capsule Commonly known as:  COLACE Take 200 mg by mouth daily.   ketotifen 0.025 % ophthalmic solution Commonly known as:  ZADITOR 1 drop 2 (two) times daily.   lacosamide 50 MG Tabs tablet Commonly known as:  VIMPAT Take 1 tablet (50 mg total) by mouth 2 (two) times daily.   omega-3 acid ethyl esters 1 g capsule Commonly known as:  LOVAZA Take 1 g by mouth daily.   prazosin 2 MG capsule Commonly known as:  MINIPRESS Take 2 mg by mouth at bedtime.   QUEtiapine 25 MG tablet Commonly known as:  SEROQUEL Take 1 tablet (25 mg total) by mouth at bedtime.   Vitamin D-3 1000 units Caps Take 1 capsule by mouth daily.       Review of Systems  Constitutional: Negative for appetite change, chills, fatigue and fever.  HENT: Negative for congestion, rhinorrhea, sinus pain, sinus pressure, sneezing and sore throat.   Eyes: Negative.   Respiratory: Negative for cough, chest tightness, shortness of breath and wheezing.   Cardiovascular: Negative for chest pain, palpitations and leg swelling.  Gastrointestinal: Negative for abdominal distention, abdominal pain, constipation,  diarrhea, nausea and vomiting.  Endocrine: Negative.   Genitourinary: Negative for dysuria, flank pain and urgency.  Musculoskeletal: Positive for gait problem.  Skin: Negative for color change, pallor and rash.  Neurological: Negative for dizziness, seizures, syncope, light-headedness and headaches.  Hematological: Does not bruise/bleed easily.  Psychiatric/Behavioral: Negative for agitation, hallucinations and sleep disturbance. The patient is not nervous/anxious.     Vitals:   12/21/16 1045  BP: 135/73  Pulse: 60  Resp: 16  Temp: 98.1 F (36.7 C)  SpO2: 98%  Weight: 168 lb (76.2 kg)  Height: 5' 7.5" (1.715 m)   Body mass index is 25.92 kg/m. Physical Exam  Constitutional: He appears well-developed and well-nourished. No distress.  Intermittent forgetfulness   HENT:  Head: Normocephalic.  Mouth/Throat: Oropharynx is clear and moist. No oropharyngeal exudate.  Eyes: Conjunctivae and EOM are normal. Pupils are equal, round, and reactive to light. Right eye exhibits no discharge. Left eye exhibits no discharge. No scleral icterus.  Neck: Normal range of motion. No JVD present. No thyromegaly present.  Cardiovascular: Normal rate, regular rhythm, normal heart sounds and intact distal pulses.  Exam reveals no gallop and no friction rub.   No murmur heard. Pulmonary/Chest: Breath sounds normal. No respiratory distress. He has no wheezes. He has no rales.  Abdominal: Soft. Bowel sounds are normal. He exhibits no distension. There is no tenderness. There is no rebound and no guarding.  Genitourinary:  Genitourinary Comments: Continent   Musculoskeletal: He exhibits no edema, tenderness or deformity.  Moves x 4 extremities. Unsteady gait. uses FWW.   Lymphadenopathy:    He has no cervical adenopathy.  Neurological: He is alert.  Intermittent forgetfulness  Skin: Skin is warm and dry. No rash noted. No erythema. No pallor.  Psychiatric: He has a normal mood and affect.    Labs  reviewed: Basic Metabolic Panel:  Recent Labs  12/01/16 1802  12/04/16 0651 12/06/16 0650 12/08/16 0520  NA  --   < > 142 144 142  K  --   < > 3.8 3.7 3.4*  CL  --   < > 111 110 107  CO2  --   < > 23 26 25   GLUCOSE  --   < > 80 108* 97  BUN  --   < > 10 10 15   CREATININE  --   < > 1.63* 1.72* 1.70*  CALCIUM  --   < > 8.3* 8.6* 8.8*  MG 2.3  --   --   --   --   PHOS 1.6*  --   --   --   --   < > = values in this interval not displayed. Liver Function Tests:  Recent Labs  12/01/16 1336 12/02/16 0837  AST 18 25  ALT 15* 16*  ALKPHOS 41 37*  BILITOT 0.6 0.9  PROT 7.2 7.0  ALBUMIN 3.4* 3.3*    Recent Labs  12/01/16 1334  AMMONIA 21   CBC:  Recent Labs  12/01/16 1336 12/02/16 0837  WBC 5.1 8.2  NEUTROABS 3.7  --   HGB 11.6* 11.0*  HCT 32.7* 30.8*  MCV 80.1 80.4  PLT 171 183   Cardiac Enzymes:  Recent Labs  12/01/16 1802  CKTOTAL 113  TROPONINI <0.03   Assessment/Plan:   1. Unsteady gait Has worked well with PT/ OT.Will discharge home PT/OT to continue with ROM, Exercise, Gait stability and muscle strengthening. He will require  DME: FWW to allow her to maintain current level of independence with ADL's. Fall and safety precautions.   2. Seizure Status post short term rehabilitation post hospital admission from 12/01/2016-12/08/2016 with acute encephalopathy.He was seen by neurology and underwent extensive workup. 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 He was also diagnosed with seizure and started on Vimpat. He had acute on chronic kidney disease and was treated with IV fluids. His psychiatric medications were held and later discontinued with Seroquel being resumed at a lower dose.There was concern for normal pressure hydrocephalus based on imaging studies and neurosurgery had recommended outpatient follow-up.No seizure activity here in rehab.Has upcoming appointment with Neurosurgery Dr. Annette Stable 12/30/2016. Continue  on Vmpat. Patient and patient's daughter aware that patient should not drive for at least 6 months for now and will need formal clearance from PCP/ neurology prior to resuming driving.  3. Mixed hyperlipidemia Continue on Lipitor. Lipid panel with PCP.   4. Benign prostatic hyperplasia without lower urinary tract symptoms Continue on Flomax   5. PTSD (post-traumatic stress disorder) Continue on Prazosin  and Seroquel. Seroquel reduced   Patient is being discharged with the following home health services:     -PT/OT to continue with ROM, Exercise, Gait stability and muscle strengthening.  -HH RN for medication management   Patient is being discharged with the following durable medical equipment:    - FWW to allow her to maintain current level of independence with ADL's.  Patient has been advised to f/u with their PCP in 1-2 weeks to for a transitions of care visit. Social services at their facility was responsible for arranging this appointment. Pt was provided with adequate prescriptions of noncontrolled medications to reach the scheduled appointment .For controlled substances, a limited supply was provided as appropriate for the individual patient.  If the pt normally receives these medications from a pain clinic or has a contract with another physician, these medications should be received from that clinic or physician only).    Future labs/tests needed:  CBC, BMP in 1-2 weeks with PCP

## 2016-12-31 LAB — FUNGAL ORGANISM REFLEX

## 2016-12-31 LAB — FUNGUS CULTURE WITH STAIN

## 2016-12-31 LAB — FUNGUS CULTURE RESULT

## 2017-05-13 ENCOUNTER — Other Ambulatory Visit: Payer: Self-pay | Admitting: Internal Medicine

## 2017-05-13 DIAGNOSIS — N183 Chronic kidney disease, stage 3 unspecified: Secondary | ICD-10-CM

## 2017-05-19 ENCOUNTER — Ambulatory Visit
Admission: RE | Admit: 2017-05-19 | Discharge: 2017-05-19 | Disposition: A | Discharge status: Home / Self Care | Payer: Medicare Other | Source: Ambulatory Visit | Attending: Internal Medicine | Admitting: Internal Medicine

## 2017-05-19 DIAGNOSIS — N183 Chronic kidney disease, stage 3 unspecified: Secondary | ICD-10-CM

## 2018-10-31 IMAGING — MR MR HEAD W/O CM
11 series · 48 of 48 positions shown · non-contrast
Comparison: CT HEAD December 01, 2016 at 9079 hours and MRI of the
head February 23, 2005

CLINICAL DATA: A few days of increasing and confusion, obtunded.
Recent seizure. History of prostate cancer, seizures,
hyperlipidemia.

EXAM:
MRI HEAD WITHOUT CONTRAST
TECHNIQUE: Multiplanar, multiecho pulse sequences of the brain and surrounding
structures were obtained without intravenous contrast.

[Series 3: DWI · axial · 3.0mm · 0.94mm/px · z∈[-22,+104]mm · 10 of 86 slices shown (1 of 2)]
[im 1/86]
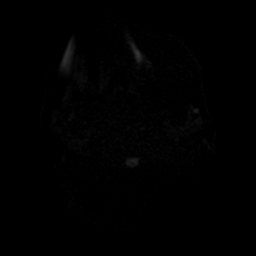
[im 10/86]
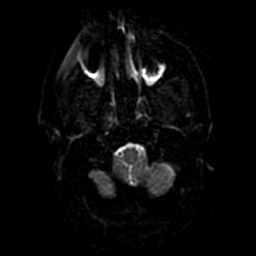
[im 19/86]
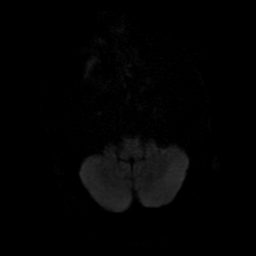
[im 29/86]
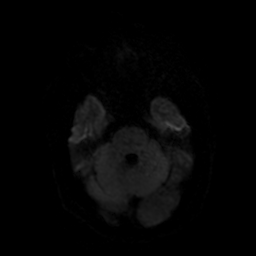
[im 38/86]
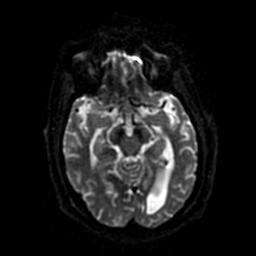
[im 48/86]
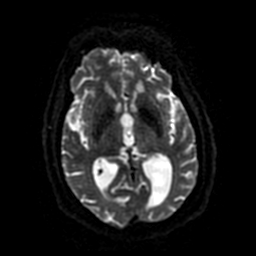
[im 57/86]
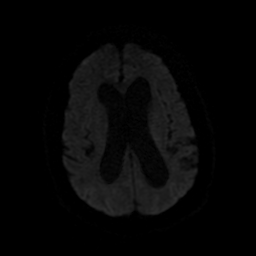
[im 67/86]
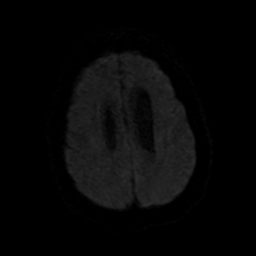
[im 76/86]
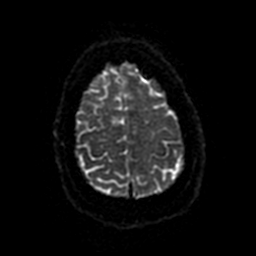
[im 86/86]
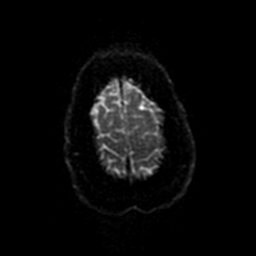

[Series 4: FLAIR · axial · 5.0mm · 0.94mm/px · z∈[-22,+104]mm · 3 of 22 slices shown]
[im 1/22]
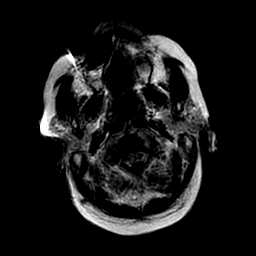
[im 11/22]
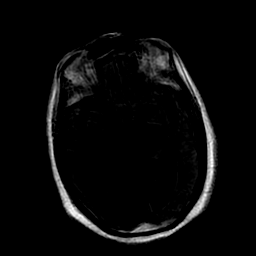
[im 22/22]
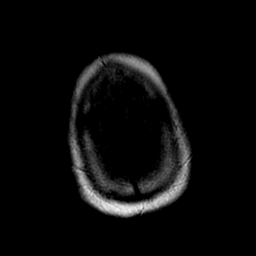

[Series 5: GRE · axial · 5.0mm · 0.94mm/px · z∈[-22,+104]mm · 3 of 22 slices shown]
[im 1/22]
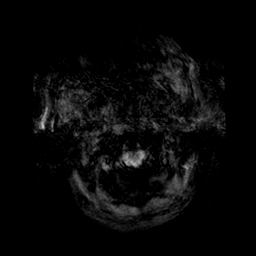
[im 11/22]
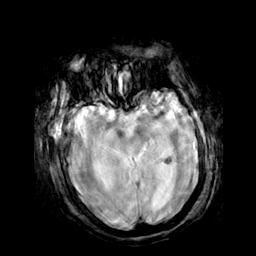
[im 22/22]
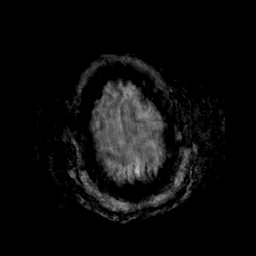

[Series 7: T2 · axial · 5.0mm · 0.47mm/px · z∈[-22,+104]mm · 3 of 22 slices shown (1 of 3)]
[im 1/22]
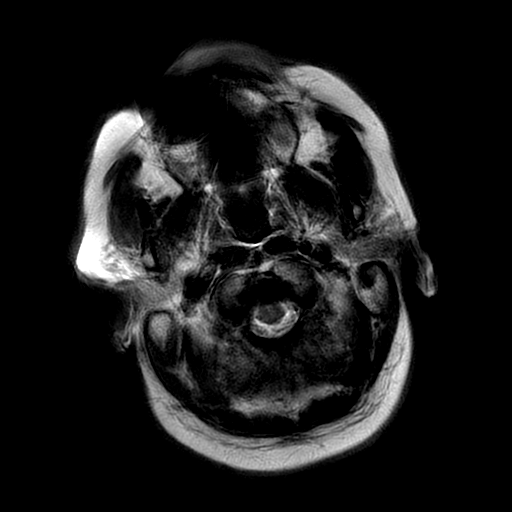
[im 11/22]
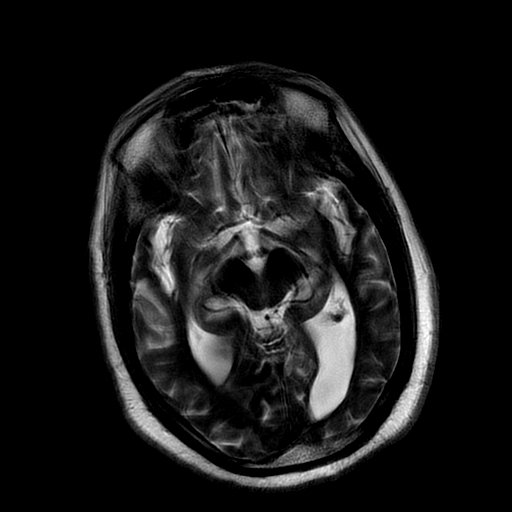
[im 22/22]
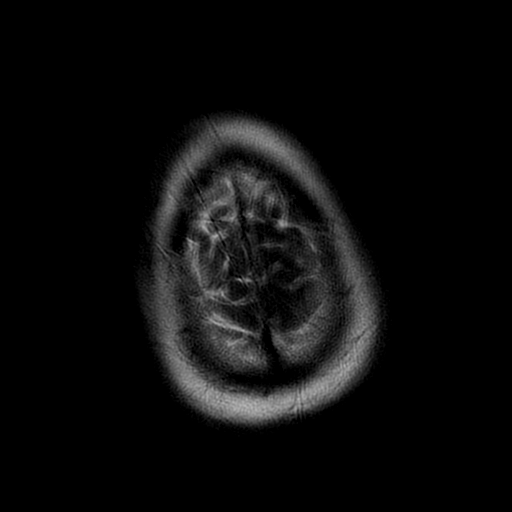

[Series 8: T2 · coronal · 3.5mm · 0.70mm/px · 3 of 28 slices shown (2 of 3)]
[im 1/28]
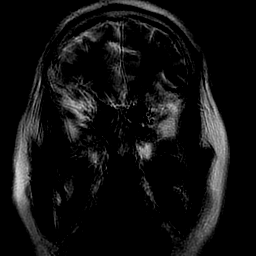
[im 14/28]
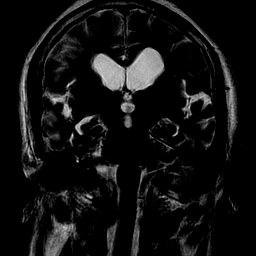
[im 28/28]
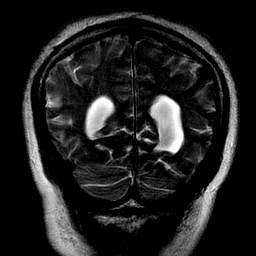

[Series 9: DWI · coronal · 4.0mm · 0.94mm/px · 8 of 70 slices shown (2 of 2)]
[im 1/70]
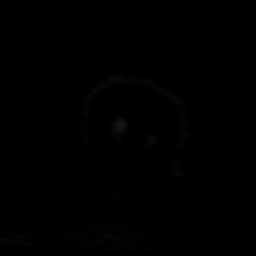
[im 10/70]
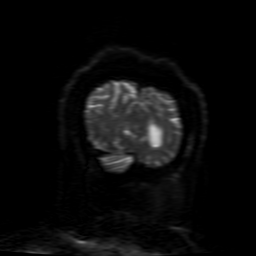
[im 20/70]
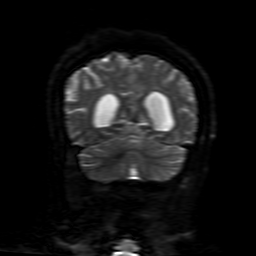
[im 30/70]
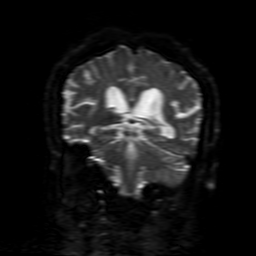
[im 40/70]
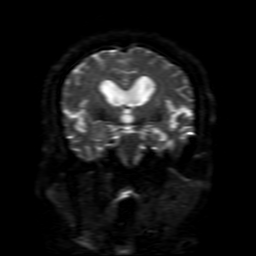
[im 50/70]
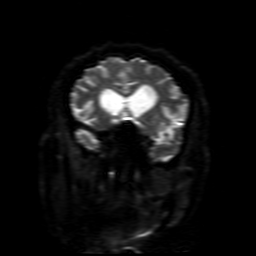
[im 60/70]
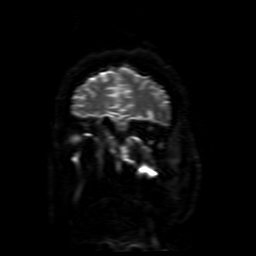
[im 70/70]
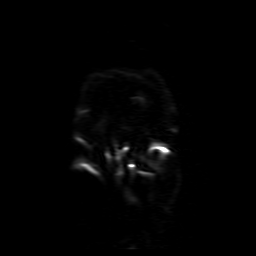

[Series 10: T1 · axial · 5.0mm · 0.47mm/px · z∈[-22,+104]mm · 3 of 22 slices shown (1 of 2)]
[im 1/22]
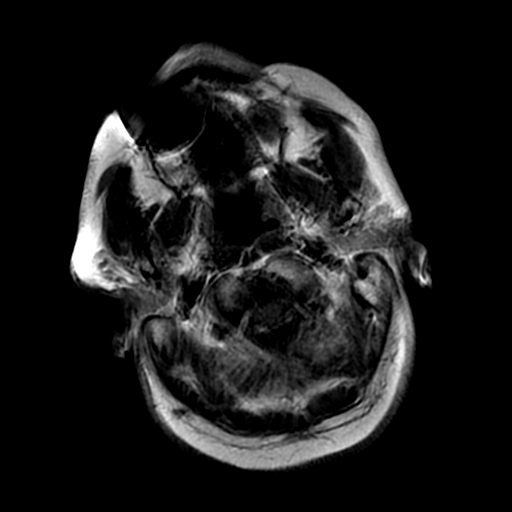
[im 11/22]
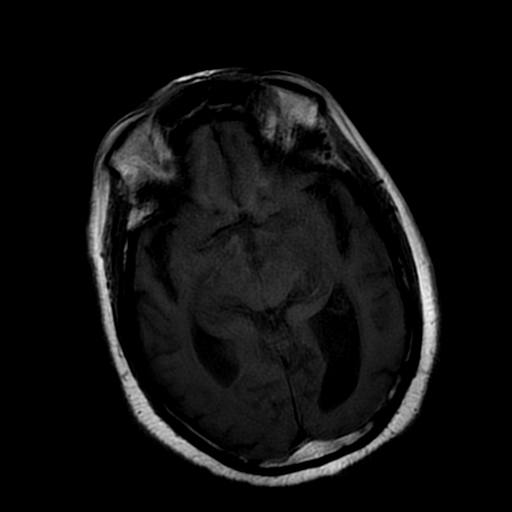
[im 22/22]
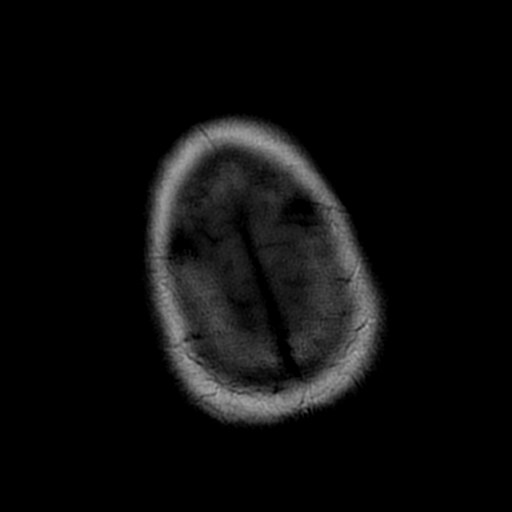

[Series 11: T2 · coronal · 5.0mm · 0.94mm/px · 3 of 29 slices shown (3 of 3)]
[im 1/29]
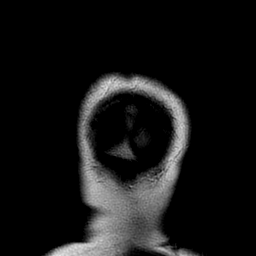
[im 15/29]
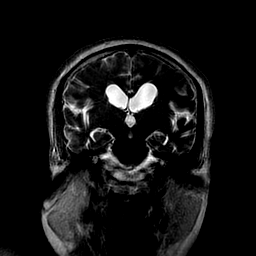
[im 29/29]
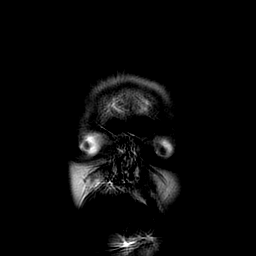

[Series 12: T1 · sagittal · 5.0mm · 0.94mm/px · 3 of 23 slices shown (2 of 2)]
[im 1/23]
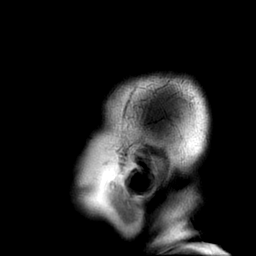
[im 12/23]
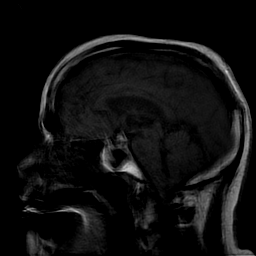
[im 23/23]
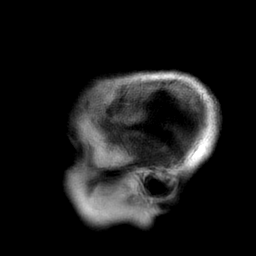

[Series 350: ADC · axial · 3.0mm · 0.94mm/px · z∈[-22,+104]mm · 5 of 43 slices shown (1 of 2)]
[im 1/43]
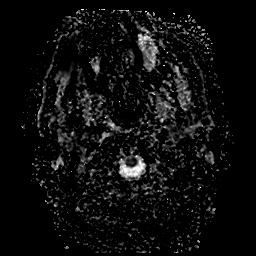
[im 11/43]
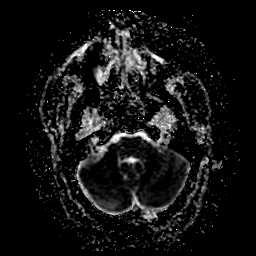
[im 22/43]
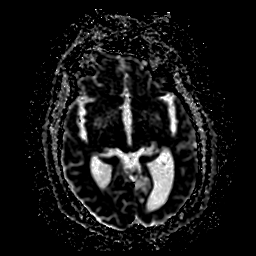
[im 32/43]
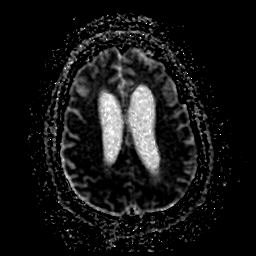
[im 43/43]
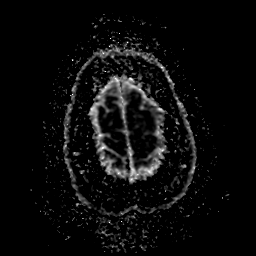

[Series 950: ADC · coronal · 4.0mm · 0.94mm/px · 4 of 35 slices shown (2 of 2)]
[im 1/35]
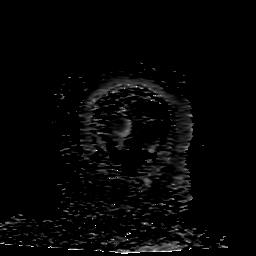
[im 12/35]
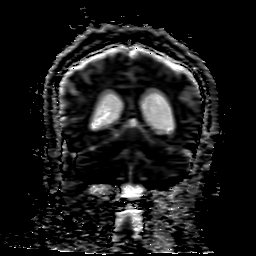
[im 23/35]
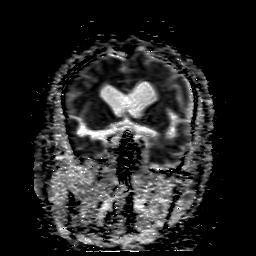
[im 35/35]
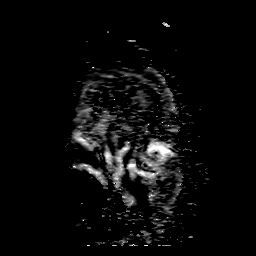

[48 of 48 positions shown; findings below may reference images not displayed]

FINDINGS: Multiple sequences are moderate to severely motion degraded.

BRAIN: No reduced diffusion to suggest acute ischemia. No
susceptibility artifact to suggest hemorrhage. Moderate to severe
ventriculomegaly, disproportionate sulcal effacement at the
convexities. No suspicious parenchymal signal, masses or mass
effect. No abnormal extra-axial fluid collections. No extra-axial
masses though, contrast enhanced sequences would be more sensitive.
Limited assessment of the hippocampi due to motion. Generally
symmetric size and signal.

VASCULAR: Normal major intracranial vascular flow voids present at
skull base.

SKULL AND UPPER CERVICAL SPINE: No abnormal sellar expansion. No
suspicious calvarial bone marrow signal. Craniocervical junction
maintained. Cerebellar tonsils at but not below the foramen magnum.

SINUSES/ORBITS: Paranasal sinus mucosal thickening. Mastoid air
cells are well aerated. The included ocular globes and orbital
contents are non-suspicious.

OTHER: None.
IMPRESSION: No acute intracranial process on this moderate to severely motion
degraded examination.

Moderate to severe probable normal pressure hydrocephalus.

## 2019-02-21 IMAGING — US US RENAL
1 series · 14 of 25 positions shown · non-contrast
Comparison: None in PACs

CLINICAL DATA: Chronic renal insufficiency stage III-IV with
decreasing creatinine clearance.

EXAM:
RENAL / URINARY TRACT ULTRASOUND COMPLETE

[Series 1: us renal · 0.23mm/px · 14 of 39 slices shown]
[im 1/39]
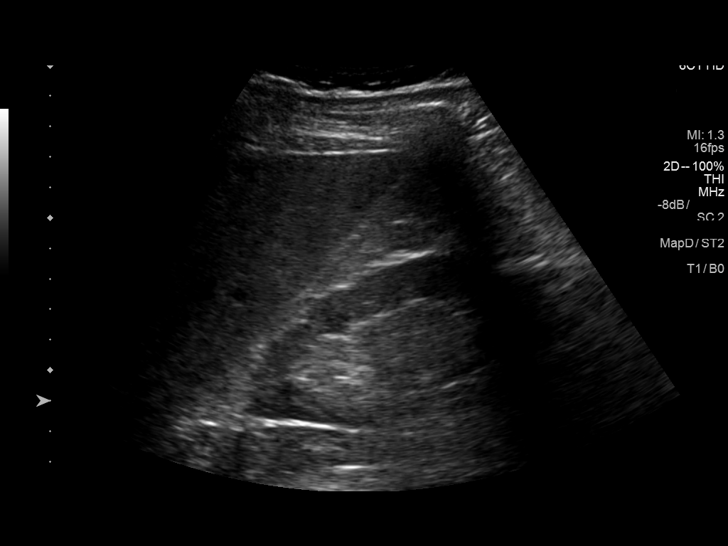
[im 4/39]
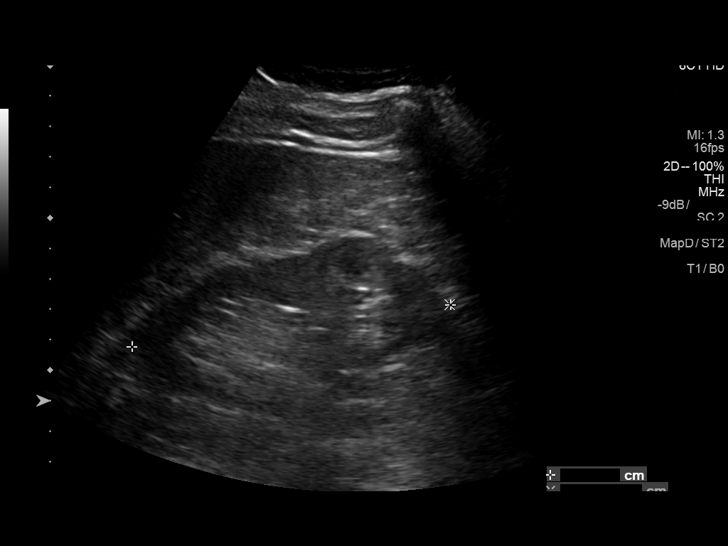
[im 7/39]
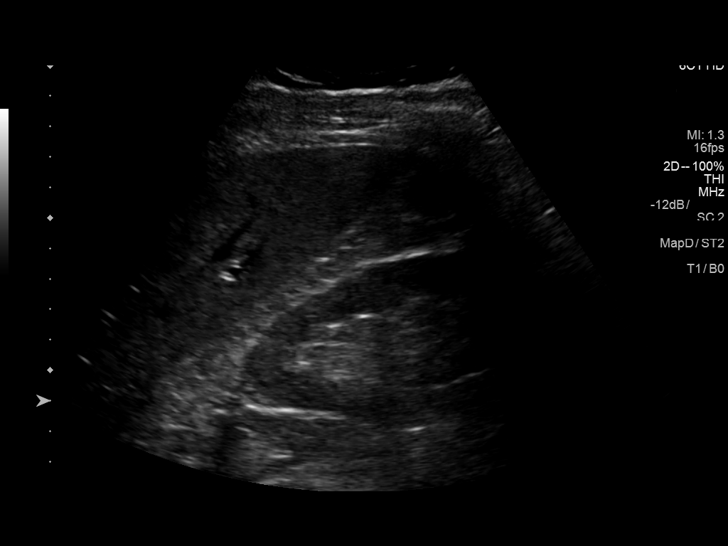
[im 10/39]
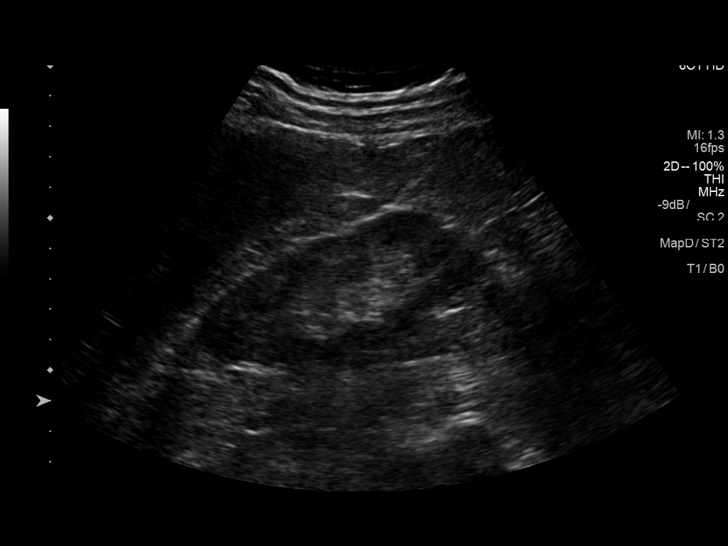
[im 13/39]
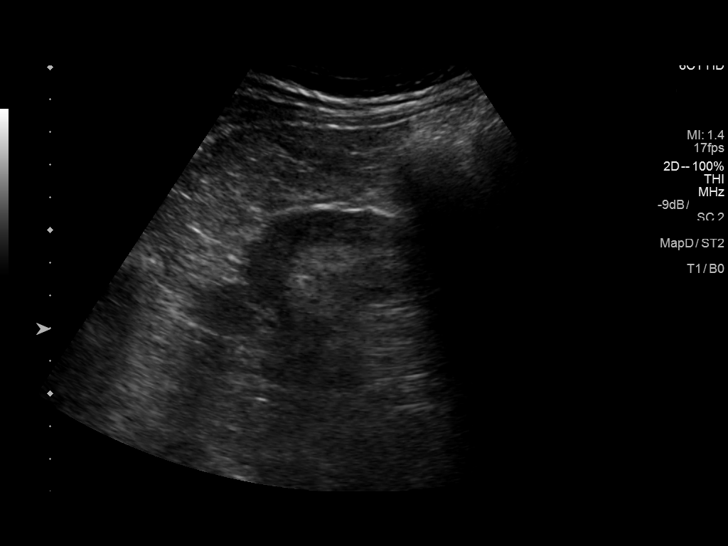
[im 15/39]
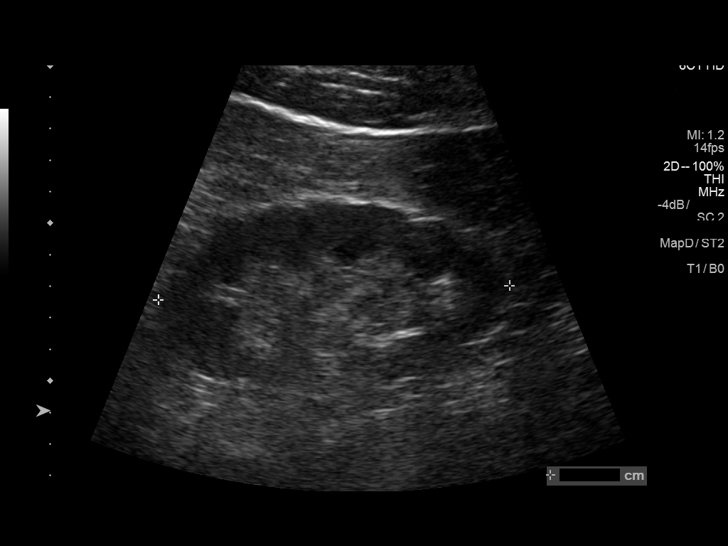
[im 18/39]
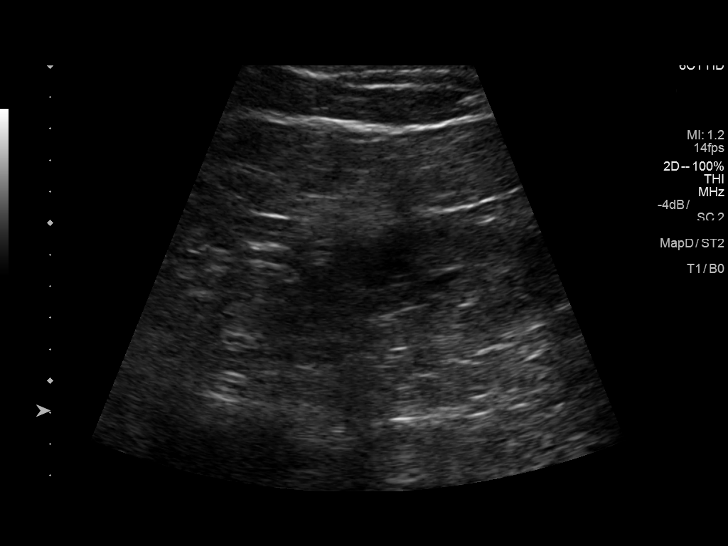
[im 21/39]
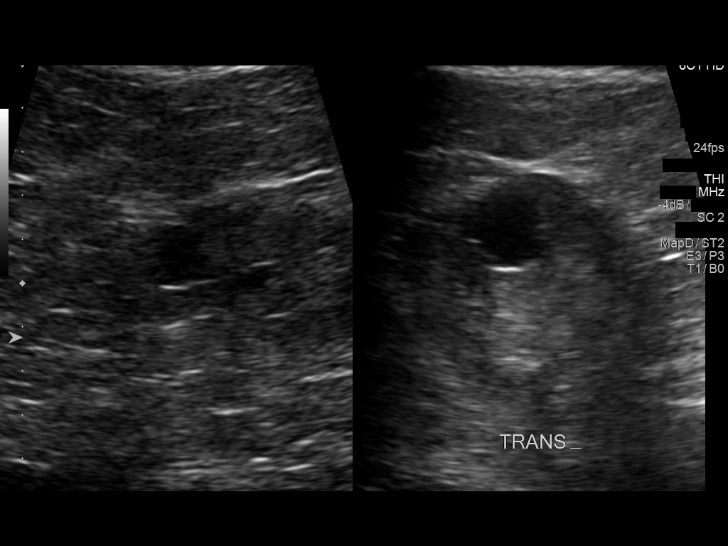
[im 24/39]
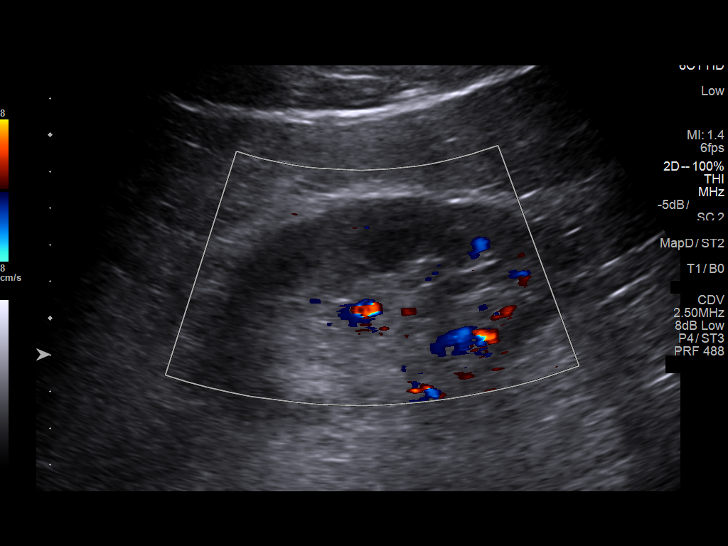
[im 26/39]
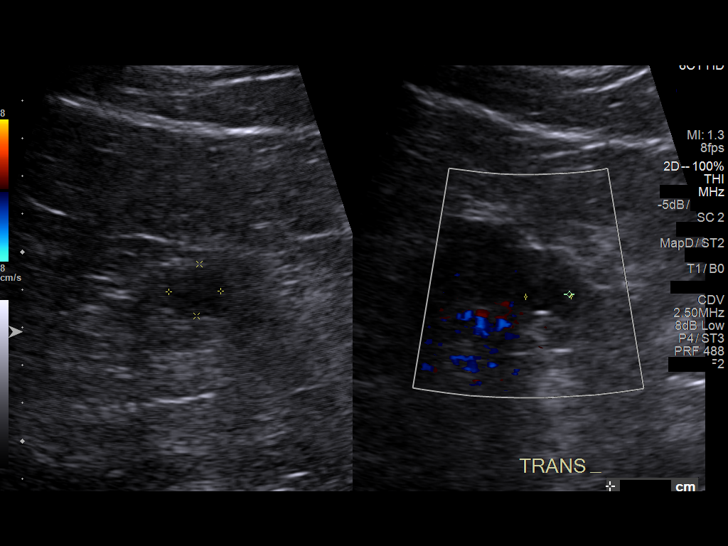
[im 29/39]
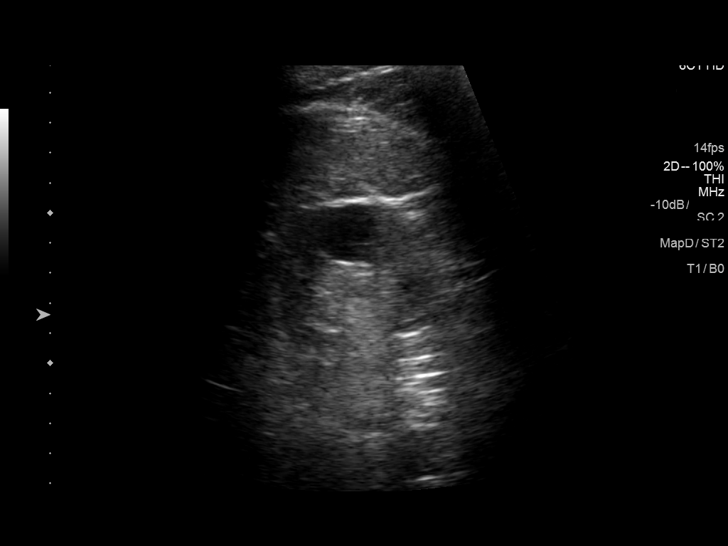
[im 32/39]
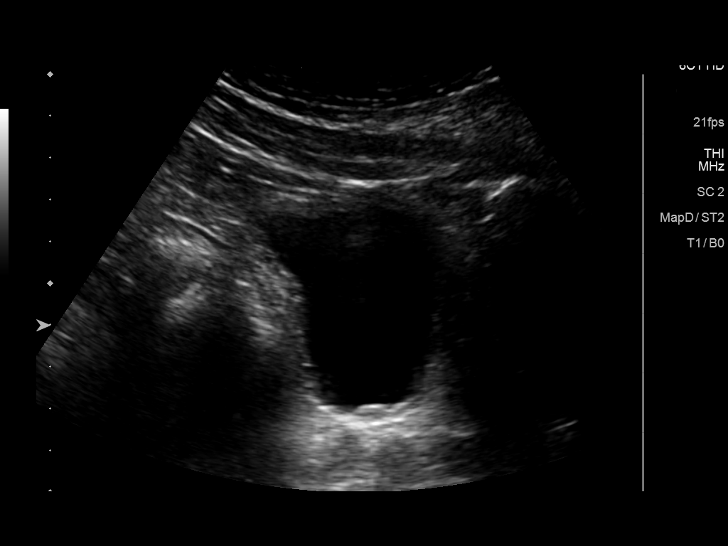
[im 35/39]
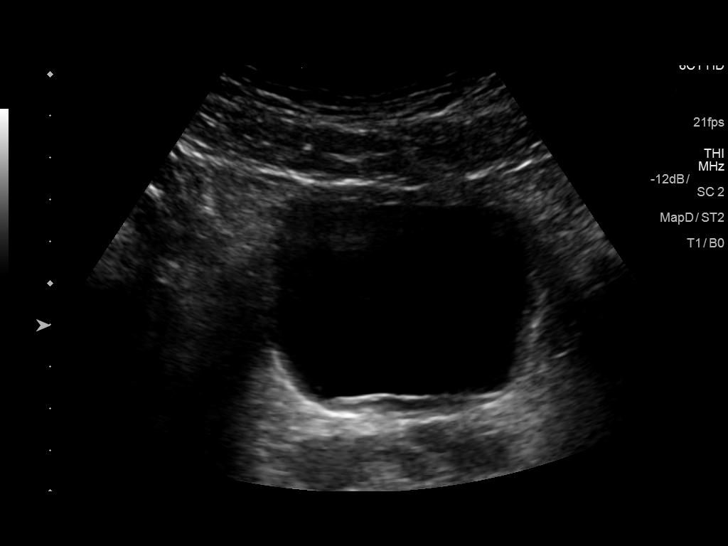
[im 39/39]
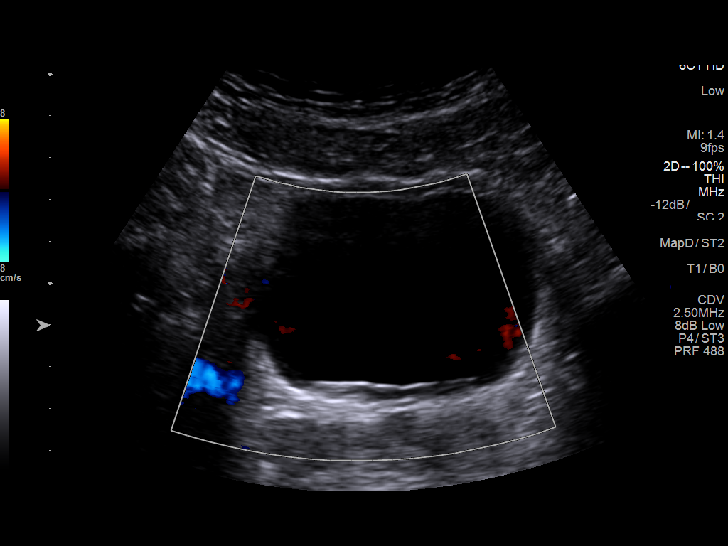

[14 of 25 positions shown; findings below may reference images not displayed]

FINDINGS: Right Kidney:

Length: 11.1 cm. The renal cortical echotexture is approximately
equal to that of the liver. There is mild cortical thinning. There
is no focal mass or hydronephrosis.

Left Kidney:

Length: 11.1 cm. The renal cortical echotexture is similar to that
on the right. There is a parapelvic cyst measuring 1.4 cm in
greatest dimension. There is a lateral cortical cyst in the midpole
measuring 1.7 cm in greatest dimension. There is no hydronephrosis.

Bladder:

The urinary bladder is partially distended. A right ureteral jet was
observed but no left ureteral jet was observed. Echogenic foci in
the posterior aspect of the urinary bladder suggests debris.
IMPRESSION: Mildly increased renal cortical echotexture bilaterally is
compatible with medical renal disease. There is no hydronephrosis.
There are simple appearing cysts in the left kidney.

Echogenic debris within the urinary bladder is observed. No bladder
mass is demonstrated. Only a right ureteral jet was observed.

## 2020-09-06 DIAGNOSIS — Z20822 Contact with and (suspected) exposure to covid-19: Secondary | ICD-10-CM | POA: Diagnosis not present

## 2020-11-21 DIAGNOSIS — K219 Gastro-esophageal reflux disease without esophagitis: Secondary | ICD-10-CM | POA: Diagnosis not present

## 2020-11-21 DIAGNOSIS — E785 Hyperlipidemia, unspecified: Secondary | ICD-10-CM | POA: Diagnosis not present

## 2020-11-21 DIAGNOSIS — N1832 Chronic kidney disease, stage 3b: Secondary | ICD-10-CM | POA: Diagnosis not present

## 2020-11-21 DIAGNOSIS — N400 Enlarged prostate without lower urinary tract symptoms: Secondary | ICD-10-CM | POA: Diagnosis not present

## 2020-11-21 DIAGNOSIS — Z8546 Personal history of malignant neoplasm of prostate: Secondary | ICD-10-CM | POA: Diagnosis not present

## 2020-12-02 ENCOUNTER — Other Ambulatory Visit: Payer: Medicare PPO

## 2020-12-02 DIAGNOSIS — Z20822 Contact with and (suspected) exposure to covid-19: Secondary | ICD-10-CM

## 2020-12-04 LAB — NOVEL CORONAVIRUS, NAA: SARS-CoV-2, NAA: NOT DETECTED

## 2020-12-04 LAB — SARS-COV-2, NAA 2 DAY TAT

## 2021-06-16 DIAGNOSIS — Z03818 Encounter for observation for suspected exposure to other biological agents ruled out: Secondary | ICD-10-CM | POA: Diagnosis not present

## 2021-07-07 DIAGNOSIS — Z1389 Encounter for screening for other disorder: Secondary | ICD-10-CM | POA: Diagnosis not present

## 2021-07-07 DIAGNOSIS — E782 Mixed hyperlipidemia: Secondary | ICD-10-CM | POA: Diagnosis not present

## 2021-07-07 DIAGNOSIS — Z0000 Encounter for general adult medical examination without abnormal findings: Secondary | ICD-10-CM | POA: Diagnosis not present

## 2021-07-07 DIAGNOSIS — M5136 Other intervertebral disc degeneration, lumbar region: Secondary | ICD-10-CM | POA: Diagnosis not present

## 2021-07-07 DIAGNOSIS — Z8546 Personal history of malignant neoplasm of prostate: Secondary | ICD-10-CM | POA: Diagnosis not present

## 2021-07-07 DIAGNOSIS — N184 Chronic kidney disease, stage 4 (severe): Secondary | ICD-10-CM | POA: Diagnosis not present

## 2021-07-07 DIAGNOSIS — F4310 Post-traumatic stress disorder, unspecified: Secondary | ICD-10-CM | POA: Diagnosis not present

## 2021-07-07 DIAGNOSIS — N400 Enlarged prostate without lower urinary tract symptoms: Secondary | ICD-10-CM | POA: Diagnosis not present

## 2021-07-21 DIAGNOSIS — Z20822 Contact with and (suspected) exposure to covid-19: Secondary | ICD-10-CM | POA: Diagnosis not present

## 2021-12-12 DIAGNOSIS — N1832 Chronic kidney disease, stage 3b: Secondary | ICD-10-CM | POA: Diagnosis not present

## 2022-02-02 DIAGNOSIS — N1832 Chronic kidney disease, stage 3b: Secondary | ICD-10-CM | POA: Diagnosis not present

## 2022-02-02 DIAGNOSIS — N2581 Secondary hyperparathyroidism of renal origin: Secondary | ICD-10-CM | POA: Diagnosis not present

## 2022-02-02 DIAGNOSIS — N400 Enlarged prostate without lower urinary tract symptoms: Secondary | ICD-10-CM | POA: Diagnosis not present

## 2022-02-02 DIAGNOSIS — E785 Hyperlipidemia, unspecified: Secondary | ICD-10-CM | POA: Diagnosis not present

## 2022-02-02 DIAGNOSIS — K219 Gastro-esophageal reflux disease without esophagitis: Secondary | ICD-10-CM | POA: Diagnosis not present

## 2022-02-02 DIAGNOSIS — D631 Anemia in chronic kidney disease: Secondary | ICD-10-CM | POA: Diagnosis not present

## 2022-05-25 DIAGNOSIS — H1132 Conjunctival hemorrhage, left eye: Secondary | ICD-10-CM | POA: Diagnosis not present

## 2022-05-25 DIAGNOSIS — Z79899 Other long term (current) drug therapy: Secondary | ICD-10-CM | POA: Diagnosis not present

## 2022-05-25 DIAGNOSIS — Z87891 Personal history of nicotine dependence: Secondary | ICD-10-CM | POA: Diagnosis not present

## 2022-07-15 DIAGNOSIS — Z0000 Encounter for general adult medical examination without abnormal findings: Secondary | ICD-10-CM | POA: Diagnosis not present

## 2022-07-15 DIAGNOSIS — F4310 Post-traumatic stress disorder, unspecified: Secondary | ICD-10-CM | POA: Diagnosis not present

## 2022-07-15 DIAGNOSIS — D126 Benign neoplasm of colon, unspecified: Secondary | ICD-10-CM | POA: Diagnosis not present

## 2022-07-15 DIAGNOSIS — Z8546 Personal history of malignant neoplasm of prostate: Secondary | ICD-10-CM | POA: Diagnosis not present

## 2022-07-15 DIAGNOSIS — N1832 Chronic kidney disease, stage 3b: Secondary | ICD-10-CM | POA: Diagnosis not present

## 2022-07-15 DIAGNOSIS — E782 Mixed hyperlipidemia: Secondary | ICD-10-CM | POA: Diagnosis not present

## 2022-07-15 DIAGNOSIS — Z1331 Encounter for screening for depression: Secondary | ICD-10-CM | POA: Diagnosis not present

## 2022-07-15 DIAGNOSIS — N400 Enlarged prostate without lower urinary tract symptoms: Secondary | ICD-10-CM | POA: Diagnosis not present

## 2023-05-03 ENCOUNTER — Encounter (HOSPITAL_COMMUNITY): Payer: Self-pay

## 2023-05-03 ENCOUNTER — Inpatient Hospital Stay (HOSPITAL_COMMUNITY)
Admission: EM | Admit: 2023-05-03 | Discharge: 2023-05-07 | DRG: 871 | Disposition: A | Discharge status: Home Health | Payer: No Typology Code available for payment source | Attending: Internal Medicine | Admitting: Internal Medicine

## 2023-05-03 ENCOUNTER — Other Ambulatory Visit: Payer: Self-pay

## 2023-05-03 ENCOUNTER — Emergency Department (HOSPITAL_COMMUNITY): Payer: No Typology Code available for payment source

## 2023-05-03 DIAGNOSIS — R079 Chest pain, unspecified: Secondary | ICD-10-CM | POA: Diagnosis not present

## 2023-05-03 DIAGNOSIS — G9341 Metabolic encephalopathy: Secondary | ICD-10-CM | POA: Diagnosis present

## 2023-05-03 DIAGNOSIS — A419 Sepsis, unspecified organism: Principal | ICD-10-CM | POA: Diagnosis present

## 2023-05-03 DIAGNOSIS — I10 Essential (primary) hypertension: Secondary | ICD-10-CM | POA: Insufficient documentation

## 2023-05-03 DIAGNOSIS — Z79899 Other long term (current) drug therapy: Secondary | ICD-10-CM

## 2023-05-03 DIAGNOSIS — F32A Depression, unspecified: Secondary | ICD-10-CM | POA: Diagnosis present

## 2023-05-03 DIAGNOSIS — C61 Malignant neoplasm of prostate: Secondary | ICD-10-CM | POA: Diagnosis present

## 2023-05-03 DIAGNOSIS — Z9181 History of falling: Secondary | ICD-10-CM

## 2023-05-03 DIAGNOSIS — N179 Acute kidney failure, unspecified: Secondary | ICD-10-CM | POA: Diagnosis present

## 2023-05-03 DIAGNOSIS — J189 Pneumonia, unspecified organism: Secondary | ICD-10-CM | POA: Diagnosis present

## 2023-05-03 DIAGNOSIS — Z923 Personal history of irradiation: Secondary | ICD-10-CM

## 2023-05-03 DIAGNOSIS — N3281 Overactive bladder: Secondary | ICD-10-CM | POA: Diagnosis present

## 2023-05-03 DIAGNOSIS — M545 Low back pain, unspecified: Secondary | ICD-10-CM | POA: Diagnosis present

## 2023-05-03 DIAGNOSIS — G928 Other toxic encephalopathy: Secondary | ICD-10-CM | POA: Diagnosis present

## 2023-05-03 DIAGNOSIS — N184 Chronic kidney disease, stage 4 (severe): Secondary | ICD-10-CM | POA: Diagnosis present

## 2023-05-03 DIAGNOSIS — E7800 Pure hypercholesterolemia, unspecified: Secondary | ICD-10-CM | POA: Diagnosis present

## 2023-05-03 DIAGNOSIS — I129 Hypertensive chronic kidney disease with stage 1 through stage 4 chronic kidney disease, or unspecified chronic kidney disease: Secondary | ICD-10-CM | POA: Diagnosis present

## 2023-05-03 DIAGNOSIS — G8929 Other chronic pain: Secondary | ICD-10-CM | POA: Diagnosis present

## 2023-05-03 DIAGNOSIS — F4310 Post-traumatic stress disorder, unspecified: Secondary | ICD-10-CM | POA: Diagnosis present

## 2023-05-03 DIAGNOSIS — D631 Anemia in chronic kidney disease: Secondary | ICD-10-CM | POA: Diagnosis present

## 2023-05-03 DIAGNOSIS — K2970 Gastritis, unspecified, without bleeding: Secondary | ICD-10-CM | POA: Diagnosis present

## 2023-05-03 DIAGNOSIS — T4275XA Adverse effect of unspecified antiepileptic and sedative-hypnotic drugs, initial encounter: Secondary | ICD-10-CM | POA: Diagnosis present

## 2023-05-03 DIAGNOSIS — Z8546 Personal history of malignant neoplasm of prostate: Secondary | ICD-10-CM

## 2023-05-03 DIAGNOSIS — M48062 Spinal stenosis, lumbar region with neurogenic claudication: Secondary | ICD-10-CM | POA: Diagnosis present

## 2023-05-03 DIAGNOSIS — E785 Hyperlipidemia, unspecified: Secondary | ICD-10-CM | POA: Diagnosis present

## 2023-05-03 LAB — CBC WITH DIFFERENTIAL/PLATELET
Abs Immature Granulocytes: 0.04 10*3/uL (ref 0.00–0.07)
Basophils Absolute: 0 10*3/uL (ref 0.0–0.1)
Basophils Relative: 0 %
Eosinophils Absolute: 0.1 10*3/uL (ref 0.0–0.5)
Eosinophils Relative: 2 %
HCT: 26.7 % — ABNORMAL LOW (ref 39.0–52.0)
Hemoglobin: 9.3 g/dL — ABNORMAL LOW (ref 13.0–17.0)
Immature Granulocytes: 1 %
Lymphocytes Relative: 16 %
Lymphs Abs: 1.1 10*3/uL (ref 0.7–4.0)
MCH: 28.7 pg (ref 26.0–34.0)
MCHC: 34.8 g/dL (ref 30.0–36.0)
MCV: 82.4 fL (ref 80.0–100.0)
Monocytes Absolute: 0.5 10*3/uL (ref 0.1–1.0)
Monocytes Relative: 8 %
Neutro Abs: 5.2 10*3/uL (ref 1.7–7.7)
Neutrophils Relative %: 73 %
Platelets: 197 10*3/uL (ref 150–400)
RBC: 3.24 MIL/uL — ABNORMAL LOW (ref 4.22–5.81)
RDW: 13.7 % (ref 11.5–15.5)
WBC: 7 10*3/uL (ref 4.0–10.5)
nRBC: 0 % (ref 0.0–0.2)

## 2023-05-03 LAB — BASIC METABOLIC PANEL
Anion gap: 10 (ref 5–15)
BUN: 46 mg/dL — ABNORMAL HIGH (ref 8–23)
CO2: 19 mmol/L — ABNORMAL LOW (ref 22–32)
Calcium: 8.7 mg/dL — ABNORMAL LOW (ref 8.9–10.3)
Chloride: 111 mmol/L (ref 98–111)
Creatinine, Ser: 4.05 mg/dL — ABNORMAL HIGH (ref 0.61–1.24)
GFR, Estimated: 14 mL/min — ABNORMAL LOW (ref >60)
Glucose, Bld: 121 mg/dL — ABNORMAL HIGH (ref 70–99)
Potassium: 4.5 mmol/L (ref 3.5–5.1)
Sodium: 140 mmol/L (ref 135–145)

## 2023-05-03 NOTE — ED Triage Notes (Signed)
Pt bib EMS for a fall at home, pt fell due to tripping while walking to the mailbox. Pt fell straight back and did hit his head, denies LOC or blood thinners. Pt states he has had weakness ongoing for 1 wk. Pt has a colonoscopy last week, had weakness from that. Once resolved pt states he started feeling sick with congestion and lower back pain. Saw at the [facility] and prescribed an antihistamine and abx due to sinus s/s. Pt is alert and oriented. BG 123. Pt states he has chest pain, central chest pain starting at time of fall

## 2023-05-03 NOTE — ED Provider Notes (Incomplete)
[place] EMERGENCY DEPARTMENT AT [facility] Provider Note   CSN: [id number] Arrival date & time: 05/03/23  2241     History {Add pertinent medical, surgical, social history, OB history to HPI:1} Chief Complaint  Patient presents with  . Fall    Pt bib EMS for a fall at home, pt fell due to tripping while walking to the mailbox. Pt fell straight back and did hit his head, denies LOC or blood thinners. Pt states he has had weakness ongoing for 1 wk. Pt has a colonoscopy last week, had weakness from that. Once resolved pt states he started feeling sick with congestion and lower back pain. Saw at the [facility] and prescribed an antihistamine and abx due to sinus s/s. Pt is alert and oriented. BG 123. Pt states he has chest pain, central chest   . Weakness  . Chest Pain    Lance Todd is a 82 y.o. male.   Fall Associated symptoms include chest pain.  Weakness Associated symptoms: chest pain   Chest Pain Associated symptoms: weakness     82 year old male presents emergency department after a fall.  Patient reportedly walked down to the mailbox to pick up the mail of which his driveway is on an incline.  He states tripping on the concrete causing him to fall backwards.  Reports trauma to the posterior head no loss conscious, anticoagulation use.  Currently complaining of bilateral knee pain, mild headache as well as some chest discomfort.  Denies any chest pain prior to fall but states has had some central chest pain since the fall that is worsened with pressing on it.  Denies any shortness of breath, abdominal pain, nausea, vomiting, change in bowel habits.  Denies any visual disturbance from baseline, gait abnormality, slurred speech, facial droop, weakness/sensory deficits in upper extremities.  Patient reportedly had EGD as well as [facility].  Family members note the patient has been sleeping more since then as well as noticed some mild confusion  described as attempting to pay wife back after she gave a check to a plumber.  Patient has baseline CKD and family members are concerned that his renal function was not considered when being sedated for procedure on the fourth into concerned about prolonged effects.  States that since then, had some nasal congestion as well as mild cough and went to urgent care near the end of last week and was given pseudoephedrine as well as chlorpheniramine and doxycycline; patient also takes daily trazodone at night for sleep aid secondary to PTSD.   Past medical history significant for hypercholesterolemia, lumbar stenosis, PTSD, prostate cancer, acute cephalopathy, CKD 3, asymptomatic otitis,  Home Medications Prior to Admission medications   Medication Sig Start Date End Date Taking? Authorizing Provider  atorvastatin (LIPITOR) 10 MG tablet Take 10 mg by mouth daily.    [provider]  Carboxymethylcellulose Sod PF 0.25 % SOLN Place 1 drop into both eyes daily.    [provider]  cetirizine (ZYRTEC) 10 MG tablet Take 10 mg by mouth daily as needed for allergies.    [provider]  Cholecalciferol (VITAMIN D-3) 1000 units CAPS Take 1 capsule by mouth daily.    [provider]  docusate sodium (COLACE) 100 MG capsule Take 200 mg by mouth daily.    [provider]  ketotifen (ZADITOR) 0.025 % ophthalmic solution 1 drop 2 (two) times daily.    [provider]  lacosamide (VIMPAT) 50 MG  TABS tablet Take 1 tablet (50 mg total) by mouth 2 (two) times daily. 12/09/16   Kimber Relic, MD  omega-3 acid ethyl esters (LOVAZA) 1 g capsule Take 1 g by mouth daily.    [provider]  prazosin (MINIPRESS) 2 MG capsule Take 2 mg by mouth at bedtime.    [provider]  QUEtiapine (SEROQUEL) 25 MG tablet Take 1 tablet (25 mg total) by mouth at bedtime. 12/14/16   Ngetich, Donalee Citrin, NP      Allergies    Patient has no known allergies.    Review of  Systems   Review of Systems  Cardiovascular:  Positive for chest pain.  Neurological:  Positive for weakness.  All other systems reviewed and are negative.   Physical Exam Updated Vital Signs BP 117/61   Pulse 71   Temp 98.5 F (36.9 C) (Oral)   Resp 16   SpO2 100%  Physical Exam Vitals and nursing note reviewed.  Constitutional:      General: He is not in acute distress.    Appearance: He is well-developed.  HENT:     Head: Normocephalic.     Comments: Mild swelling noted to occipital region. Eyes:     Conjunctiva/sclera: Conjunctivae normal.  Cardiovascular:     Rate and Rhythm: Normal rate and regular rhythm.  Pulmonary:     Effort: Pulmonary effort is normal. No respiratory distress.     Breath sounds: Normal breath sounds. No wheezing, rhonchi or rales.     Comments: Just left of sternum chest wall tenderness to palpation with no overlying skin abnormalities. Chest:     Chest wall: Tenderness present.  Abdominal:     Palpations: Abdomen is soft.     Tenderness: There is no abdominal tenderness.  Musculoskeletal:     Cervical back: Neck supple.     Right lower leg: No edema.     Left lower leg: No edema.     Comments: Patient moves all 4 extremities without difficulty.  Mild tenderness to palpation medial aspect of bilateral knee but otherwise, upper and lower extremities without tenderness to palpation.  No midline tenderness of cervical, thoracic, lumbar spine with obvious step-off or Forei noted.  No paraspinal tenderness noted throughout the spine.  No obvious traumatic signs upon examination of patient's back.  Skin:    General: Skin is warm and dry.     Capillary Refill: Capillary refill takes less than 2 seconds.  Neurological:     Mental Status: He is alert.     Comments: Alert and oriented to self, place, time.   Speech is fluent, clear without dysarthria or dysphasia.   Strength symmetric in upper/lower extremities.  Moves all 4 extremities without  difficulty. Sensation intact in upper/lower extremities   CN I not tested  CN II not tested  CN III, IV, VI PERRLA and EOMs intact bilaterally  CN V Intact sensation to sharp and light touch to the face  CN VII facial movements symmetric  CN VIII not tested  CN IX, X no uvula deviation, symmetric rise of soft palate  CN XI symmetric SCM and trapezius strength bilaterally  CN XII Midline tongue protrusion, symmetric L/R movements     Psychiatric:        Mood and Affect: Mood normal.     ED Results / Procedures / Treatments   Labs (all labs ordered are listed, but only abnormal results are displayed) Labs Reviewed  BASIC METABOLIC PANEL  CBC  WITH DIFFERENTIAL/PLATELET  URINALYSIS, W/ REFLEX TO CULTURE (INFECTION SUSPECTED)    EKG None  Radiology No results found.  Procedures Procedures  {Document cardiac monitor, telemetry assessment procedure when appropriate:1}  Medications Ordered in ED Medications - No data to display  ED Course/ Medical Decision Making/ A&P Clinical Course as of 05/03/23 2329  Mon May 03, 2023  2321 Versed benadryl, fentanyl with egd and colonoscopy Tuesday Trazadone continued.   [CR]    Clinical Course User Index [CR] Peter Garter, PA   {   Click here for ABCD2, HEART and other calculatorsREFRESH Note before signing :1}                          Medical Decision Making Amount and/or Complexity of Data Reviewed Labs: ordered. Radiology: ordered.   This patient presents to the ED for concern of fall, this involves an extensive number of treatment options, and is a complaint that carries with it a high risk of complications and morbidity.  The differential diagnosis includes CVA, fracture, strain/sprain, dislocation,,ACS, pneumothorax, solid organ damage   Co morbidities that complicate the patient evaluation  See HPI   Additional history obtained:  Additional history obtained from EMR External records from outside source  obtained and reviewed including hospital records   Lab Tests:  I Ordered, and personally interpreted labs.  The pertinent results include:  ***   Imaging Studies ordered:  I ordered imaging studies including ***  I independently visualized and interpreted imaging which showed *** I agree with the radiologist interpretation   Cardiac Monitoring: / EKG:  The patient was maintained on a cardiac monitor.  I personally viewed and interpreted the cardiac monitored which showed an underlying rhythm of: ***   Consultations Obtained:  I requested consultation with the ***,  and discussed lab and imaging findings as well as pertinent plan - they recommend: ***   Problem List / ED Course / Critical interventions / Medication management  *** I ordered medication including ***  for ***  Reevaluation of the patient after these medicines showed that the patient {resolved/improved/worsened:23923::"improved"} I have reviewed the patients home medicines and have made adjustments as needed   Social Determinants of Health:  ***   Test / Admission - Considered:  Vitals signs significant for ***. Otherwise within normal range and stable throughout visit. Laboratory/imaging studies significant for: *** *** Worrisome signs and symptoms were discussed with the patient, and the patient acknowledged understanding to return to the ED if noticed. Patient was stable upon discharge.    {Document critical care time when appropriate:1} {Document review of labs and clinical decision tools ie heart score, Chads2Vasc2 etc:1}  {Document your independent review of radiology images, and any outside records:1} {Document your discussion with family members, caretakers, and with consultants:1} {Document social determinants of health affecting pt's care:1} {Document your decision making why or why not admission, treatments were needed:1} Final Clinical Impression(s) / ED Diagnoses Final diagnoses:  None     Rx / DC Orders ED Discharge Orders     None

## 2023-05-03 NOTE — ED Provider Notes (Signed)
[place] EMERGENCY DEPARTMENT AT [facility] Provider Note   CSN: [id number] Arrival date & time: 05/03/23  2241     History  Chief Complaint  Patient presents with   Fall    Pt bib EMS for a fall at home, pt fell due to tripping while walking to the mailbox. Pt fell straight back and did hit his head, denies LOC or blood thinners. Pt states he has had weakness ongoing for 1 wk. Pt has a colonoscopy last week, had weakness from that. Once resolved pt states he started feeling sick with congestion and lower back pain. Saw at the [facility] and prescribed an antihistamine and abx due to sinus s/s. Pt is alert and oriented. BG 123. Pt states he has chest pain, central chest    Weakness   Chest Pain    Lance Todd is a 82 y.o. male.   Fall Associated symptoms include chest pain.  Weakness Associated symptoms: chest pain   Chest Pain Associated symptoms: weakness     82 year old male presents emergency department after a fall.  Patient reportedly walked down to the mailbox to pick up the mail of which his driveway is on an incline.  He states tripping on the concrete causing him to fall backwards.  Reports trauma to the posterior head no loss conscious, anticoagulation use.  Currently complaining of bilateral knee pain, mild headache as well as some chest discomfort.  Denies any chest pain prior to fall but states has had some central chest pain since the fall that is worsened with pressing on it.  Denies any shortness of breath, abdominal pain, nausea, vomiting, change in bowel habits.  Denies any visual disturbance from baseline, gait abnormality, slurred speech, facial droop, weakness/sensory deficits in upper extremities.  Patient reportedly had EGD as well as [facility]  Family members note the patient has been sleeping more since then as well as noticed some mild confusion described as attempting to pay wife back after she gave a check to a  plumber.  Patient has baseline CKD and family members are concerned that his renal function was not considered when being sedated for procedure on the fourth into concerned about prolonged effects.  States that since then, had some nasal congestion as well as mild cough and went to urgent care near the end of last week and was given pseudoephedrine as well as chlorpheniramine and doxycycline; patient states that cough has persisted without improvement with medications given by urgent care.  Past medical history significant for hypercholesterolemia, lumbar stenosis, PTSD, prostate cancer, acute cephalopathy, CKD 3, asymptomatic otitis,  Home Medications Prior to Admission medications   Medication Sig Start Date End Date Taking? Authorizing Provider  atorvastatin (LIPITOR) 10 MG tablet Take 10 mg by mouth at bedtime.   Yes [provider]  benzonatate (TESSALON) 100 MG capsule Take 100 mg by mouth 3 (three) times daily as needed for cough.   Yes [provider]  calcitRIOL (ROCALTROL) 0.25 MCG capsule Take 0.25 mcg by mouth daily.   Yes [provider]  CALCIUM-VITAMIN D PO Take 1 tablet by mouth daily.   Yes [provider]  carboxymethylcellulose (REFRESH PLUS) 0.5 % SOLN Place 1 drop into both eyes 4 (four) times daily as needed (dry eyes).   Yes [provider]  cetirizine (ZYRTEC) 10 MG tablet Take 10 mg by mouth daily as needed for allergies.   Yes [provider]  chlorpheniramine (CHLOR-TRIMETON) 4  MG tablet Take 4 mg by mouth every 4 (four) hours as needed for allergies.   Yes [provider]  Cholecalciferol (VITAMIN D-3) 1000 units CAPS Take 1 capsule by mouth daily.   Yes [provider]  doxycycline (DORYX) 100 MG EC tablet Take 100 mg by mouth 2 (two) times daily.   Yes [provider]  fluticasone (FLONASE) 50 MCG/ACT nasal spray Place 2 sprays into both nostrils 4 (four) times daily as needed for allergies.    Yes [provider]  ketotifen (ZADITOR) 0.025 % ophthalmic solution Place 1 drop into both eyes 2 (two) times daily.   Yes [provider]  losartan (COZAAR) 25 MG tablet Take 25 mg by mouth daily.   Yes [provider]  mirabegron ER (MYRBETRIQ) 25 MG TB24 tablet Take 25 mg by mouth daily. STOP THIS MEDICATION WITH ALLERGIC SYMPTOMS OR BLOOD PRESSURE 150/100 OR HIGHER   Yes [provider]  pseudoephedrine (SUDAFED) 60 MG tablet Take 60 mg by mouth every 6 (six) hours as needed for congestion.   Yes [provider]  tamsulosin (FLOMAX) 0.4 MG CAPS capsule Take 0.4 mg by mouth at bedtime. 08/31/13  Yes [provider]  traZODone (DESYREL) 50 MG tablet Take 50 mg by mouth at bedtime.   Yes [provider]  vardenafil (LEVITRA) 20 MG tablet Take 20 mg by mouth daily as needed for erectile dysfunction.   Yes [provider]  lacosamide (VIMPAT) 50 MG TABS tablet Take 1 tablet (50 mg total) by mouth 2 (two) times daily. Patient not taking: Reported on 05/04/2023 12/09/16   Kimber Relic, MD  QUEtiapine (SEROQUEL) 25 MG tablet Take 1 tablet (25 mg total) by mouth at bedtime. Patient not taking: Reported on 05/04/2023 12/14/16   Ngetich, Donalee Citrin, NP      Allergies    Patient has no known allergies.    Review of Systems   Review of Systems  Cardiovascular:  Positive for chest pain.  Neurological:  Positive for weakness.  All other systems reviewed and are negative.   Physical Exam Updated Vital Signs BP (!) 153/63   Pulse 61   Temp 98.5 F (36.9 C)   Resp 17   SpO2 100%  Physical Exam Vitals and nursing note reviewed.  Constitutional:      General: He is not in acute distress.    Appearance: He is well-developed.  HENT:     Head: Normocephalic.     Comments: Mild swelling noted to occipital region. Eyes:     Conjunctiva/sclera: Conjunctivae normal.  Cardiovascular:     Rate and Rhythm: Normal rate and regular  rhythm.  Pulmonary:     Effort: Pulmonary effort is normal. No respiratory distress.     Breath sounds: Normal breath sounds. No wheezing, rhonchi or rales.     Comments: Just left of sternum chest wall tenderness to palpation with no overlying skin abnormalities. Chest:     Chest wall: Tenderness present.  Abdominal:     Palpations: Abdomen is soft.     Tenderness: There is no abdominal tenderness.  Musculoskeletal:     Cervical back: Neck supple.     Right lower leg: No edema.     Left lower leg: No edema.     Comments: Patient moves all 4 extremities without difficulty.  Mild tenderness to palpation medial aspect of bilateral knee but otherwise, upper and lower extremities without tenderness to palpation.  No midline tenderness of cervical, thoracic,  lumbar spine with obvious step-off or Forei noted.  No paraspinal tenderness noted throughout the spine.  No obvious traumatic signs upon examination of patient's back.  Skin:    General: Skin is warm and dry.     Capillary Refill: Capillary refill takes less than 2 seconds.  Neurological:     Mental Status: He is alert.     Comments: Alert and oriented to self, place, time.   Speech is fluent, clear without dysarthria or dysphasia.   Strength symmetric in upper/lower extremities.  Moves all 4 extremities without difficulty. Sensation intact in upper/lower extremities   CN I not tested  CN II not tested  CN III, IV, VI PERRLA and EOMs intact bilaterally  CN V Intact sensation to sharp and light touch to the face  CN VII facial movements symmetric  CN VIII not tested  CN IX, X no uvula deviation, symmetric rise of soft palate  CN XI symmetric SCM and trapezius strength bilaterally  CN XII Midline tongue protrusion, symmetric L/R movements     Psychiatric:        Mood and Affect: Mood normal.     ED Results / Procedures / Treatments   Labs (all labs ordered are listed, but only abnormal results are displayed) Labs  Reviewed  BASIC METABOLIC PANEL - Abnormal; Notable for the following components:      Result Value   CO2 19 (*)    Glucose, Bld 121 (*)    BUN 46 (*)    Creatinine, Ser 4.05 (*)    Calcium 8.7 (*)    GFR, Estimated 14 (*)    All other components within normal limits  CBC WITH DIFFERENTIAL/PLATELET - Abnormal; Notable for the following components:   RBC 3.24 (*)    Hemoglobin 9.3 (*)    HCT 26.7 (*)    All other components within normal limits  URINALYSIS, W/ REFLEX TO CULTURE (INFECTION SUSPECTED) - Abnormal; Notable for the following components:   Color, Urine STRAW (*)    Hgb urine dipstick SMALL (*)    Bacteria, UA RARE (*)    All other components within normal limits  CULTURE, BLOOD (ROUTINE X 2)  CULTURE, BLOOD (ROUTINE X 2)  EXPECTORATED SPUTUM ASSESSMENT W GRAM STAIN, RFLX TO RESP C  LEGIONELLA PNEUMOPHILA SEROGP 1 UR AG  STREP PNEUMONIAE URINARY ANTIGEN  COMPREHENSIVE METABOLIC PANEL  CBC  POC OCCULT BLOOD, ED  TROPONIN I (HIGH SENSITIVITY)  TROPONIN I (HIGH SENSITIVITY)    EKG EKG Interpretation  Date/Time:  Monday May 03 2023 23:14:22 EDT Ventricular Rate:  69 PR Interval:  179 QRS Duration: 86 QT Interval:  384 QTC Calculation: 412 R Axis:   25 Text Interpretation: Sinus rhythm Abnormal R-wave progression, early transition No significant change was found Confirmed by Drema Pry [phone]) on 05/04/2023 2:05:53 AM  Radiology CT CHEST WO CONTRAST  Result Date: 05/04/2023 CLINICAL DATA:  Blunt chest trauma. EXAM: CT CHEST WITHOUT CONTRAST TECHNIQUE: Multidetector CT imaging of the chest was performed following the standard protocol without IV contrast. RADIATION DOSE REDUCTION: This exam was performed according to the departmental dose-optimization program which includes automated exposure control, adjustment of the mA and/or kV according to patient size and/or use of iterative reconstruction technique. COMPARISON:  Radiographs 05/03/2023 FINDINGS:  Cardiovascular: Normal heart size. Small low-density mild coronary artery and aortic atherosclerotic calcification. Pericardial effusion. Mediastinum/Nodes: Trachea and esophagus are unremarkable. No thoracic adenopathy. Lungs/Pleura: No pleural effusion or pneumothorax. Patchy ground-glass opacities in the posterior right upper  lobe. Patchy nodular opacities in the superior segment left lower lobe. Bibasilar scarring. No pleural effusion or pneumothorax. Upper Abdomen: Cholelithiasis.  No acute abnormality. Musculoskeletal: No acute fracture. IMPRESSION: 1. Multifocal pneumonia. Follow-up in 6-8 weeks after treatment is recommended to ensure resolution. Aortic Atherosclerosis (ICD10-I70.0). Electronically Signed   By: Minerva Fester M.D.   On: 05/04/2023 02:15   DG Pelvis 1-2 Views  Result Date: 05/04/2023 CLINICAL DATA:  Fall, EXAM: PELVIS - 1-2 VIEW COMPARISON:  None Available. FINDINGS: Normal alignment. No acute fracture or dislocation. Hip and sacroiliac joint spaces are preserved. Brachytherapy seeds are seen within the expected prostate gland. Soft tissues are otherwise unremarkable. IMPRESSION: 1. Negative. Electronically Signed   By: Helyn Numbers M.D.   On: 05/04/2023 00:04   DG Knee Complete 4 Views Left  Result Date: 05/04/2023 CLINICAL DATA:  Larey Seat in driveway.  Bilateral knee pain. EXAM: LEFT KNEE - COMPLETE 4+ VIEW; RIGHT KNEE - COMPLETE 4+ VIEW COMPARISON:  None Available. FINDINGS: No acute fracture or dislocation of either knee. Small left and trace right knee joint effusions. Bilateral patellar enthesophytes. Mild medial compartment narrowing in the left knee. IMPRESSION: No acute fracture or dislocation. Electronically Signed   By: Minerva Fester M.D.   On: 05/04/2023 00:04   DG Knee Complete 4 Views Right  Result Date: 05/04/2023 CLINICAL DATA:  Larey Seat in driveway.  Bilateral knee pain. EXAM: LEFT KNEE - COMPLETE 4+ VIEW; RIGHT KNEE - COMPLETE 4+ VIEW COMPARISON:  None Available.  FINDINGS: No acute fracture or dislocation of either knee. Small left and trace right knee joint effusions. Bilateral patellar enthesophytes. Mild medial compartment narrowing in the left knee. IMPRESSION: No acute fracture or dislocation. Electronically Signed   By: Minerva Fester M.D.   On: 05/04/2023 00:04   DG Chest 2 View  Result Date: 05/04/2023 CLINICAL DATA:  Larey Seat in driveway.  Mid chest pain. EXAM: CHEST - 2 VIEW COMPARISON:  Radiograph 12/01/2016 FINDINGS: Low lung volumes accentuate cardiomediastinal silhouette and pulmonary vascularity. Hazy opacities project over the midthoracic spine on lateral view without correlate on frontal view. No pleural effusion or pneumothorax. No displaced rib fractures. IMPRESSION: Hazy opacities project over the midthoracic spine on lateral view without frontal correlate. This could be projectional however pneumonia or contusion are not excluded. Consider chest CT for further evaluation. Electronically Signed   By: Minerva Fester M.D.   On: 05/04/2023 00:02   CT Head Wo Contrast  Result Date: 05/03/2023 CLINICAL DATA:  Head trauma, minor (Age >= 65y); Neck trauma (Age >= 65y), fall EXAM: CT HEAD WITHOUT CONTRAST CT CERVICAL SPINE WITHOUT CONTRAST TECHNIQUE: Multidetector CT imaging of the head and cervical spine was performed following the standard protocol without intravenous contrast. Multiplanar CT image reconstructions of the cervical spine were also generated. RADIATION DOSE REDUCTION: This exam was performed according to the departmental dose-optimization program which includes automated exposure control, adjustment of the mA and/or kV according to patient size and/or use of iterative reconstruction technique. COMPARISON:  12/11/2016 FINDINGS: CT HEAD FINDINGS Brain: Similar prior examination, there is moderate ventriculomegaly, stable since prior examination, which appears asymmetric with the degree of parenchymal volume loss. 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 No evidence of acute intracranial hemorrhage or infarct. No abnormal mass effect or midline shift. No abnormal intra or extra-axial mass lesion. Ventricular size is normal. Vascular: No hyperdense vessel or unexpected calcification. Skull: Normal. Negative  for fracture or focal lesion. Sinuses/Orbits: There is mucosal thickening within the visualized ethmoid air cells and maxillary sinuses. Orbits are unremarkable. Other: Mastoid air cells and middle ear cavities are clear. CT CERVICAL SPINE FINDINGS Alignment: Overall straightening of the cervical spine. No listhesis. Skull base and vertebrae: Craniocervical alignment is normal. The atlantodental interval is not widened. No acute fracture of the cervical spine vertebral body height is preserved. Soft tissues and spinal canal: No prevertebral fluid or swelling. No visible canal hematoma. 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 Disc levels: There is intervertebral disc space narrowing and exuberant osteophyte formation in keeping with changes of advanced degenerative disc disease. Degenerative ankylosis of the C4 and C5 vertebral bodies. The prevertebral soft tissues are not thickened on sagittal reformats. Multilevel uncovertebral arthrosis throughout the cervical spine results in multilevel severe neuroforaminal narrowing bilaterally. Upper chest: Negative. Other: None IMPRESSION: 1. No acute intracranial abnormality. No calvarial fracture. 2. No acute fracture or listhesis of the cervical spine. 3. 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 4. 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 Electronically Signed   By: Helyn Numbers M.D.   On: 05/03/2023 23:54   CT Cervical Spine Wo Contrast  Result Date: 05/03/2023 CLINICAL DATA:  Head trauma, minor (Age >= 65y); Neck trauma (Age >= 65y), fall EXAM: CT HEAD WITHOUT CONTRAST CT CERVICAL SPINE WITHOUT CONTRAST TECHNIQUE: Multidetector CT imaging of the head and cervical spine was performed following the standard protocol without intravenous contrast. Multiplanar CT image reconstructions of the cervical spine were also generated. RADIATION DOSE REDUCTION: This exam was performed according to the departmental dose-optimization program which includes automated exposure control, adjustment of the mA and/or kV according to patient size and/or use of iterative reconstruction technique. COMPARISON:  12/11/2016 FINDINGS: CT HEAD FINDINGS Brain: Similar prior examination, there is moderate ventriculomegaly, stable since prior examination, which appears asymmetric with the degree of parenchymal volume loss. 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 No evidence of acute intracranial hemorrhage or infarct. No abnormal mass effect or midline shift. No abnormal intra or extra-axial mass lesion. Ventricular size is normal. Vascular: No hyperdense vessel or unexpected calcification. Skull: Normal. Negative for fracture or focal lesion. Sinuses/Orbits: There is mucosal thickening within the visualized ethmoid air cells and maxillary sinuses. Orbits are unremarkable. Other: Mastoid air cells and middle ear cavities are  clear. CT CERVICAL SPINE FINDINGS Alignment: Overall straightening of the cervical spine. No listhesis. Skull base and vertebrae: Craniocervical alignment is normal. The atlantodental interval is not widened. No acute fracture of the cervical spine vertebral body height is preserved. Soft tissues and spinal canal: No prevertebral fluid or swelling. No visible canal hematoma. 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 Disc levels: There is intervertebral disc space narrowing and exuberant osteophyte formation in keeping with changes of advanced degenerative disc disease. Degenerative ankylosis of the C4 and C5 vertebral bodies. The prevertebral soft tissues are not thickened on sagittal reformats. Multilevel uncovertebral arthrosis throughout the cervical spine results in multilevel severe neuroforaminal narrowing bilaterally. Upper chest: Negative. Other: None IMPRESSION: 1. No acute intracranial abnormality. No calvarial fracture. 2. No acute fracture or listhesis of the cervical spine. 3. 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 4. 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 Electronically Signed   By: Helyn Numbers M.D.   On: 05/03/2023 23:54    Procedures Procedures    Medications Ordered in ED Medications  azithromycin (ZITHROMAX) 500 mg in sodium chloride 0.9 % 250 mL IVPB (has no administration in time range)   azithromycin (ZITHROMAX) 500 mg in sodium chloride 0.9 % 250 mL IVPB (has no administration in time range)  cefTRIAXone (ROCEPHIN) 2 g in sodium chloride 0.9 % 100 mL IVPB (has no administration in time range)  heparin injection 5,000 Units (has no administration in time range)  ondansetron (ZOFRAN) tablet 4 mg (has no administration in time range)    Or  ondansetron (ZOFRAN) injection 4 mg (has no administration in time range)  albuterol (PROVENTIL) (2.5 MG/3ML) 0.083% nebulizer solution 2.5 mg (has no administration in time range)  sodium chloride 0.9 % bolus 500 mL (0 mLs Intravenous Stopped 05/04/23 0242)  cefTRIAXone (ROCEPHIN) 1 g in sodium chloride 0.9 % 100 mL IVPB (0 g Intravenous Stopped 05/04/23 0427)    ED Course/ Medical Decision Making/ A&P Clinical Course as of 05/04/23 0457  Mon May 03, 2023  2321 Versed benadryl, fentanyl with egd and colonoscopy Tuesday Trazadone continued.   [CR]  Tue May 04, 2023  0140 Family members able to obtain laboratory studies from the [place] with most recent creatinine of 3, GFR of 19 and BUN of 33 from March 28. [CR]  0307 Consulted hospitalist Dr. [name] who agreed with admission and assume further treatment/care. [CR]    Clinical Course User Index [CR] Peter Garter, PA                             Medical Decision Making Amount and/or Complexity of Data Reviewed Labs: ordered. Radiology: ordered.  Risk Decision regarding hospitalization.   This patient presents to the ED for concern of fall, this involves an extensive number of treatment options, and is a complaint that carries with it a high risk of complications and morbidity.  The differential diagnosis includes CVA, fracture, strain/sprain, dislocation,,ACS, pneumothorax, solid organ damage   Co morbidities that complicate the patient evaluation  See HPI   Additional history obtained:  Additional history obtained from EMR External records from outside source obtained  and reviewed including hospital records   Lab Tests:  I Ordered, and personally interpreted labs.  The pertinent results include: No leukocytosis.  Patient evidence anemia with a hemoglobin 9.3.  Placed within normal range.  Mild hypocalcemia of 8.7 otherwise, electrolytes within normal limits besides decreased bicarb of 19.  Patient with near baseline renal dysfunction with creatinine of 4.05 and BUN  of 46 and GFR 14.  UA significant for rare bacteria, small hemoglobin but otherwise unremarkable.  Initial troponin of 6   Imaging Studies ordered:  I ordered imaging studies including CT head/C-spine, bilateral knee x-ray, chest x-ray, pelvis x-ray, CT chest I independently visualized and interpreted imaging which showed  CT head/C-spine: No acute intracranial abnormality.  No acute fracture or subluxation of cervical spine.  Stable moderate ventriculomegaly asymmetric with degree of parenchymal volume loss.  Advanced degenerative changes of the cervical spine. Bilateral knee x-ray: No acute osseous abnormality Chest x-ray: Possible hazy opacities Pelvis x-ray: No acute abnormality CT chest: Multifocal pneumonia I agree with the radiologist interpretation  Cardiac Monitoring: / EKG:  The patient was maintained on a cardiac monitor.  I personally viewed and interpreted the cardiac monitored which showed an underlying rhythm of: Sinus rhythm   Consultations Obtained:  See ED course  Problem List / ED Course / Critical interventions / Medication management  Multifocal pneumonia, fall I ordered medication including 100 cc normal saline, azithromycin, Rocephin   Reevaluation of the patient after these medicines showed that the patient improved I have reviewed the patients home medicines and have made adjustments as needed   Social Determinants of Health:  Denies tobacco, illicit drug use   Test / Admission - Considered:  Multifocal pneumonia, fall Vitals signs significant for  hypertension with blood pressure 153/63. Otherwise within normal range and stable throughout visit. Laboratory/imaging studies significant for: See above 82 year old male presents emergency department after mechanical fall.  From traumatic workup, workup reassuring with no evidence of acute traumatic injury on imaging studies obtained.  Patient was with evidence of reported increased confusion over the past week or so after EGD/colonoscopy was performed as well as worsening of cough.  Patient found with evidence of multifocal pneumonia which could be attributing to reported increased confusion.  Patient does not meet SIRS criteria so sepsis protocol was not followed.  Begun on broad-spectrum antibiotics in the form of Rocephin and azithromycin for community-acquired pneumonia coverage.  Given evidence of reported increasing confusion, multifocal pneumonia that was refractory to oral doxycycline prescribed the outpatient setting, admission deemed most appropriate.  Treatment plan discussed at length with patient and family and they acknowledge understanding were agreeable to said plan.  Appropriate consultations were made as depicted in ED course.  Patient stable upon admission to the hospital.        Final Clinical Impression(s) / ED Diagnoses Final diagnoses:  Multifocal pneumonia    Rx / DC Orders ED Discharge Orders     None         Peter Garter, [place] 05/04/23 0457    Nira Conn, MD 05/04/23 [phone]

## 2023-05-04 ENCOUNTER — Emergency Department (HOSPITAL_COMMUNITY): Payer: No Typology Code available for payment source

## 2023-05-04 DIAGNOSIS — N179 Acute kidney failure, unspecified: Secondary | ICD-10-CM | POA: Diagnosis present

## 2023-05-04 DIAGNOSIS — F4310 Post-traumatic stress disorder, unspecified: Secondary | ICD-10-CM | POA: Diagnosis present

## 2023-05-04 DIAGNOSIS — Z923 Personal history of irradiation: Secondary | ICD-10-CM | POA: Diagnosis not present

## 2023-05-04 DIAGNOSIS — J9601 Acute respiratory failure with hypoxia: Secondary | ICD-10-CM | POA: Diagnosis not present

## 2023-05-04 DIAGNOSIS — I10 Essential (primary) hypertension: Secondary | ICD-10-CM | POA: Insufficient documentation

## 2023-05-04 DIAGNOSIS — N3281 Overactive bladder: Secondary | ICD-10-CM | POA: Diagnosis present

## 2023-05-04 DIAGNOSIS — M545 Low back pain, unspecified: Secondary | ICD-10-CM | POA: Diagnosis present

## 2023-05-04 DIAGNOSIS — M48062 Spinal stenosis, lumbar region with neurogenic claudication: Secondary | ICD-10-CM | POA: Diagnosis present

## 2023-05-04 DIAGNOSIS — G8929 Other chronic pain: Secondary | ICD-10-CM | POA: Diagnosis present

## 2023-05-04 DIAGNOSIS — N184 Chronic kidney disease, stage 4 (severe): Secondary | ICD-10-CM | POA: Diagnosis present

## 2023-05-04 DIAGNOSIS — Z8546 Personal history of malignant neoplasm of prostate: Secondary | ICD-10-CM | POA: Diagnosis not present

## 2023-05-04 DIAGNOSIS — G9341 Metabolic encephalopathy: Secondary | ICD-10-CM | POA: Diagnosis present

## 2023-05-04 DIAGNOSIS — Z79899 Other long term (current) drug therapy: Secondary | ICD-10-CM | POA: Diagnosis not present

## 2023-05-04 DIAGNOSIS — T4275XA Adverse effect of unspecified antiepileptic and sedative-hypnotic drugs, initial encounter: Secondary | ICD-10-CM | POA: Diagnosis present

## 2023-05-04 DIAGNOSIS — A419 Sepsis, unspecified organism: Secondary | ICD-10-CM | POA: Diagnosis present

## 2023-05-04 DIAGNOSIS — F32A Depression, unspecified: Secondary | ICD-10-CM | POA: Diagnosis present

## 2023-05-04 DIAGNOSIS — G928 Other toxic encephalopathy: Secondary | ICD-10-CM | POA: Diagnosis present

## 2023-05-04 DIAGNOSIS — I129 Hypertensive chronic kidney disease with stage 1 through stage 4 chronic kidney disease, or unspecified chronic kidney disease: Secondary | ICD-10-CM | POA: Diagnosis present

## 2023-05-04 DIAGNOSIS — Z9181 History of falling: Secondary | ICD-10-CM | POA: Diagnosis not present

## 2023-05-04 DIAGNOSIS — K2970 Gastritis, unspecified, without bleeding: Secondary | ICD-10-CM | POA: Diagnosis present

## 2023-05-04 DIAGNOSIS — R079 Chest pain, unspecified: Secondary | ICD-10-CM | POA: Diagnosis present

## 2023-05-04 DIAGNOSIS — J189 Pneumonia, unspecified organism: Secondary | ICD-10-CM | POA: Diagnosis present

## 2023-05-04 DIAGNOSIS — D631 Anemia in chronic kidney disease: Secondary | ICD-10-CM | POA: Diagnosis present

## 2023-05-04 DIAGNOSIS — E7800 Pure hypercholesterolemia, unspecified: Secondary | ICD-10-CM | POA: Diagnosis present

## 2023-05-04 LAB — COMPREHENSIVE METABOLIC PANEL
ALT: 14 U/L (ref 0–44)
AST: 15 U/L (ref 15–41)
Albumin: 2.9 g/dL — ABNORMAL LOW (ref 3.5–5.0)
Alkaline Phosphatase: 32 U/L — ABNORMAL LOW (ref 38–126)
Anion gap: 9 (ref 5–15)
BUN: 46 mg/dL — ABNORMAL HIGH (ref 8–23)
CO2: 19 mmol/L — ABNORMAL LOW (ref 22–32)
Calcium: 8.2 mg/dL — ABNORMAL LOW (ref 8.9–10.3)
Chloride: 112 mmol/L — ABNORMAL HIGH (ref 98–111)
Creatinine, Ser: 3.57 mg/dL — ABNORMAL HIGH (ref 0.61–1.24)
GFR, Estimated: 16 mL/min — ABNORMAL LOW (ref >60)
Glucose, Bld: 100 mg/dL — ABNORMAL HIGH (ref 70–99)
Potassium: 4.3 mmol/L (ref 3.5–5.1)
Sodium: 140 mmol/L (ref 135–145)
Total Bilirubin: 0.7 mg/dL (ref 0.3–1.2)
Total Protein: 6.3 g/dL — ABNORMAL LOW (ref 6.5–8.1)

## 2023-05-04 LAB — IRON AND TIBC
Iron: 54 ug/dL (ref 45–182)
Saturation Ratios: 28 % (ref 17.9–39.5)
TIBC: 193 ug/dL — ABNORMAL LOW (ref 250–450)
UIBC: 139 ug/dL

## 2023-05-04 LAB — CBC
HCT: 25.3 % — ABNORMAL LOW (ref 39.0–52.0)
Hemoglobin: 8.8 g/dL — ABNORMAL LOW (ref 13.0–17.0)
MCH: 28.6 pg (ref 26.0–34.0)
MCHC: 34.8 g/dL (ref 30.0–36.0)
MCV: 82.1 fL (ref 80.0–100.0)
Platelets: 184 10*3/uL (ref 150–400)
RBC: 3.08 MIL/uL — ABNORMAL LOW (ref 4.22–5.81)
RDW: 13.6 % (ref 11.5–15.5)
WBC: 6.3 10*3/uL (ref 4.0–10.5)
nRBC: 0 % (ref 0.0–0.2)

## 2023-05-04 LAB — URINALYSIS, W/ REFLEX TO CULTURE (INFECTION SUSPECTED)
Bilirubin Urine: NEGATIVE
Glucose, UA: NEGATIVE mg/dL
Ketones, ur: NEGATIVE mg/dL
Leukocytes,Ua: NEGATIVE
Nitrite: NEGATIVE
Protein, ur: NEGATIVE mg/dL
Specific Gravity, Urine: 1.009 (ref 1.005–1.030)
pH: 5 (ref 5.0–8.0)

## 2023-05-04 LAB — PROCALCITONIN: Procalcitonin: <0.1 ng/mL

## 2023-05-04 LAB — STREP PNEUMONIAE URINARY ANTIGEN: Strep Pneumo Urinary Antigen: NEGATIVE

## 2023-05-04 LAB — TROPONIN I (HIGH SENSITIVITY)
Troponin I (High Sensitivity): 5 ng/L (ref <18)
Troponin I (High Sensitivity): 6 ng/L (ref <18)

## 2023-05-04 LAB — RETICULOCYTES
Immature Retic Fract: 17.6 % — ABNORMAL HIGH (ref 2.3–15.9)
RBC.: 3.09 MIL/uL — ABNORMAL LOW (ref 4.22–5.81)
Retic Count, Absolute: 23.2 10*3/uL (ref 19.0–186.0)
Retic Ct Pct: 0.8 % (ref 0.4–3.1)

## 2023-05-04 LAB — FERRITIN: Ferritin: 160 ng/mL (ref 24–336)

## 2023-05-04 LAB — VITAMIN B12: Vitamin B-12: 535 pg/mL (ref 180–914)

## 2023-05-04 LAB — FOLATE: Folate: 11.6 ng/mL (ref >5.9)

## 2023-05-04 MED ORDER — HEPARIN SODIUM (PORCINE) 5000 UNIT/ML IJ SOLN
5000.0000 [IU] | Freq: Three times a day (TID) | INTRAMUSCULAR | Status: DC
  Administered 2023-05-04 – 2023-05-07 (×11): 5000 [IU] via SUBCUTANEOUS
  Filled 2023-05-04 (×11): qty 1

## 2023-05-04 MED ORDER — ATORVASTATIN CALCIUM 10 MG PO TABS
10.0000 mg | ORAL_TABLET | Freq: Every day | ORAL | Status: DC
  Administered 2023-05-04 – 2023-05-06 (×3): 10 mg via ORAL
  Filled 2023-05-04 (×3): qty 1

## 2023-05-04 MED ORDER — SODIUM CHLORIDE 0.9 % IV SOLN
500.0000 mg | Freq: Once | INTRAVENOUS | Status: AC
  Administered 2023-05-04: 500 mg via INTRAVENOUS
  Filled 2023-05-04: qty 5

## 2023-05-04 MED ORDER — MIRABEGRON ER 25 MG PO TB24
25.0000 mg | ORAL_TABLET | Freq: Every day | ORAL | Status: DC
  Administered 2023-05-04 – 2023-05-07 (×4): 25 mg via ORAL
  Filled 2023-05-04 (×4): qty 1

## 2023-05-04 MED ORDER — ALBUTEROL SULFATE (2.5 MG/3ML) 0.083% IN NEBU: 2.5000 mg | INHALATION_SOLUTION | RESPIRATORY_TRACT | Status: DC | PRN

## 2023-05-04 MED ORDER — SODIUM CHLORIDE 0.9 % IV SOLN
500.0000 mg | INTRAVENOUS | Status: DC
  Administered 2023-05-05: 500 mg via INTRAVENOUS
  Filled 2023-05-04 (×2): qty 5

## 2023-05-04 MED ORDER — SODIUM CHLORIDE 0.9 % IV SOLN
2.0000 g | INTRAVENOUS | Status: DC
  Administered 2023-05-04 – 2023-05-05 (×2): 2 g via INTRAVENOUS
  Filled 2023-05-04 (×3): qty 20

## 2023-05-04 MED ORDER — SODIUM CHLORIDE 0.9 % IV SOLN: 1.0000 g | INTRAVENOUS | Status: DC

## 2023-05-04 MED ORDER — SODIUM CHLORIDE 0.9 % IV SOLN: 500.0000 mg | INTRAVENOUS | Status: DC

## 2023-05-04 MED ORDER — SODIUM CHLORIDE 0.9 % IV SOLN: INTRAVENOUS | Status: AC

## 2023-05-04 MED ORDER — ONDANSETRON HCL 4 MG PO TABS: 4.0000 mg | ORAL_TABLET | Freq: Four times a day (QID) | ORAL | Status: DC | PRN

## 2023-05-04 MED ORDER — ONDANSETRON HCL 4 MG/2ML IJ SOLN: 4.0000 mg | Freq: Four times a day (QID) | INTRAMUSCULAR | Status: DC | PRN

## 2023-05-04 MED ORDER — SODIUM CHLORIDE 0.9 % IV SOLN
1.0000 g | Freq: Once | INTRAVENOUS | Status: AC
  Administered 2023-05-04: 1 g via INTRAVENOUS
  Filled 2023-05-04: qty 10

## 2023-05-04 MED ORDER — TAMSULOSIN HCL 0.4 MG PO CAPS
0.4000 mg | ORAL_CAPSULE | Freq: Every day | ORAL | Status: DC
  Administered 2023-05-04 – 2023-05-06 (×3): 0.4 mg via ORAL
  Filled 2023-05-04 (×3): qty 1

## 2023-05-04 MED ORDER — SODIUM CHLORIDE 0.9 % IV BOLUS
500.0000 mL | Freq: Once | INTRAVENOUS | Status: AC
  Administered 2023-05-04: 500 mL via INTRAVENOUS

## 2023-05-04 NOTE — Progress Notes (Signed)
Mobility Specialist - Progress Note   05/04/23 1329  Mobility  Activity Ambulated with assistance in hallway  Level of Assistance Contact guard assist, steadying assist  Assistive Device Front wheel walker  Distance Ambulated (ft) 230 ft  Activity Response Tolerated well  Mobility Referral Yes  $Mobility charge 1 Mobility  Mobility Specialist Start Time (ACUTE ONLY) 1249  Mobility Specialist Stop Time (ACUTE ONLY) 1320  Mobility Specialist Time Calculation (min) (ACUTE ONLY) 31 min   Pt received in bed and agreeable to mobility. Pt was MinA from STS & had some LOB throughout session. Pt required verbal cure for walker mobility. No complaints during session. Pt to recliner after session with all needs met. Chair alarm on  [facility] Mobility Specialist

## 2023-05-04 NOTE — Evaluation (Signed)
Physical Therapy Evaluation Patient Details Name: Lance Todd MRN: [id number] DOB: [birth year] Today's Date: 05/04/2023  History of Present Illness  Pt is an 82 y/o M admitted on 05/03/23 after presenting to the hospital with c/o a fall, confusion, cough & congestion. CT chest showed multifocal PNA. PMH: prostate CA, HLD, OAB, CLBP depression, PTSD, HLD, CKD 3-4  Clinical Impression  Pt seen for PT evaluation with pt agreeable, wife present in room & she provides PLOF & home set up information. Prior to admission pt was independent without AD, driving, 1 fall yesterday. On this date, pt is AxOx4 but presents with decreased overall awareness, decreased safety awareness & slow processing. Pt is able to ambulate without AD with min assist, with RW with CGA. Pt with slightly improved balance with RW but poor awareness & safety when mobilizing with AD. PT educated pt's wife that RW is more likely to cause tripping hazard at this time & wife in agreement. Provided wife with gait belt & educated her on it's use; wife reports comfort assisting pt with gait. Recommend ongoing PT services to address gait with most safe LRAD, balance, & awareness.       Recommendations for follow up therapy are one component of a multi-disciplinary discharge planning process, led by the attending physician.  Recommendations may be updated based on patient status, additional functional criteria and insurance authorization.  Follow Up Recommendations       Assistance Recommended at Discharge Intermittent Supervision/Assistance  Patient can return home with the following  A little help with walking and/or transfers;A little help with bathing/dressing/bathroom;Assistance with cooking/housework;Assist for transportation;Help with stairs or ramp for entrance    Equipment Recommendations None recommended by PT  Recommendations for Other Services       Functional Status Assessment Patient has had a recent decline in their  functional status and demonstrates the ability to make significant improvements in function in a reasonable and predictable amount of time.     Precautions / Restrictions Precautions Precautions: Fall Restrictions Weight Bearing Restrictions: No      Mobility  Bed Mobility               General bed mobility comments: not tested, pt received & left sitting in recliner    Transfers Overall transfer level: Needs assistance Equipment used: Rolling walker (2 wheels), None Transfers: Sit to/from Stand Sit to Stand: Min guard, Supervision           General transfer comment: STS from recliner; cuing/education re: hand placement during STS with RW    Ambulation/Gait Ambulation/Gait assistance: Min assist, Min guard Gait Distance (Feet): 150 Feet (+ 200 ft) Assistive device: Rolling walker (2 wheels), None Gait Pattern/deviations: Decreased step length - right, Decreased step length - left, Decreased stride length, Decreased dorsiflexion - right, Decreased dorsiflexion - left       General Gait Details: decreased heel strike BLE. Pt ambulates without AD with min assist with shuffled gait (decreased dorsiflexion during swing phase, decreased heel strike, decreased step length BLE, decreased stride length). Provided pt with RW & pt ambulates with same gait pattern despite cuing for more normalized steps, with CGA for balance, and decreased awareness of safe use of AD. Pt requires max cuing to ambulate within AD but poor return demo & will have feet on either side of RW legs.  Stairs            Wheelchair Mobility    Modified Rankin (Stroke Patients Only)  Balance Overall balance assessment: Needs assistance   Sitting balance-Leahy Scale: Fair     Standing balance support: During functional activity, No upper extremity supported Standing balance-Leahy Scale: Fair                               Pertinent Vitals/Pain Pain Assessment Pain  Assessment: No/denies pain    Home Living Family/patient expects to be discharged to:: Private residence Living Arrangements: Spouse/significant other Available Help at Discharge: Family Type of Home: House Home Access: Stairs to enter Entrance Stairs-Rails: None (but can hold to L side of his house) Entrance Stairs-Number of Steps: 2   Home Layout: One level Home Equipment: Cane - single point      Prior Function Prior Level of Function : Independent/Modified Independent;Driving             Mobility Comments: Pt independent without AD, driving, only 1 fall yesterday in the past 6 months       Hand Dominance        Extremity/Trunk Assessment   Upper Extremity Assessment Upper Extremity Assessment: Overall WFL for tasks assessed    Lower Extremity Assessment Lower Extremity Assessment: Generalized weakness    Cervical / Trunk Assessment Cervical / Trunk Assessment: Normal  Communication   Communication: No difficulties  Cognition Arousal/Alertness: Awake/alert Behavior During Therapy: WFL for tasks assessed/performed Overall Cognitive Status: Impaired/Different from baseline Area of Impairment: Safety/judgement, Awareness, Problem solving                         Safety/Judgement: Decreased awareness of safety, Decreased awareness of deficits Awareness: Anticipatory, Emergent Problem Solving: Slow processing General Comments: Pt is oriented x 4, follows simple commands throughotu session, decreased safety awareness & overall awareness.        General Comments      Exercises Other Exercises Other Exercises: Pt performs 10x STS from recliner without BUE support with cuing to come to full upright standing; activity focused on BLE strengthening & endurance training.   Assessment/Plan    PT Assessment Patient needs continued PT services  PT Problem List Decreased strength;Decreased coordination;Decreased activity tolerance;Decreased  balance;Decreased cognition;Decreased knowledge of use of DME;Decreased safety awareness;Decreased mobility       PT Treatment Interventions Therapeutic exercise;Balance training;Gait training;DME instruction;Stair training;Neuromuscular re-education;Functional mobility training;Cognitive remediation;Therapeutic activities;Patient/family education;Modalities    PT Goals (Current goals can be found in the Care Plan section)  Acute Rehab PT Goals Patient Stated Goal: get better PT Goal Formulation: With patient Time For Goal Achievement: 05/18/23 Potential to Achieve Goals: Good    Frequency Min 3X/week     Co-evaluation               AM-PAC PT "6 Clicks" Mobility  Outcome Measure Help needed turning from your back to your side while in a flat bed without using bedrails?: None Help needed moving from lying on your back to sitting on the side of a flat bed without using bedrails?: None Help needed moving to and from a bed to a chair (including a wheelchair)?: A Little Help needed standing up from a chair using your arms (e.g., wheelchair or bedside chair)?: A Little Help needed to walk in hospital room?: A Little Help needed climbing 3-5 steps with a railing? : A Little 6 Click Score: 20    End of Session Equipment Utilized During Treatment: Gait belt Activity Tolerance: Patient tolerated treatment well Patient left:  in chair;with chair alarm set;with call bell/phone within reach   PT Visit Diagnosis: Unsteadiness on feet (R26.81);Muscle weakness (generalized) (M62.81);Difficulty in walking, not elsewhere classified (R26.2)    Time: 1610-9604 PT Time Calculation (min) (ACUTE ONLY): 21 min   Charges:   PT Evaluation $PT Eval Low Complexity: 1 Low          Aleda Grana, PT, DPT 05/04/23, 4:03 PM   Sandi Mariscal 05/04/2023, 4:00 PM

## 2023-05-04 NOTE — ED Notes (Signed)
ED TO INPATIENT HANDOFF REPORT  Name/Age/Gender Lance Todd 82 y.o. male  Code Status    Code Status Orders  (From admission, onward)           Start     Ordered   05/04/23 0410  Full code  Continuous       Question:  By:  Answer:  Consent: discussion documented in EHR   05/04/23 0413           Code Status History     Date Active Date Inactive Code Status Order ID Comments User Context   12/01/2016 1725 12/08/2016 2157 Full Code [id number]  Ozella Rocks, MD ED       Home/SNF/Other Home  Chief Complaint CAP (community acquired pneumonia) [J18.9]  Level of Care/Admitting Diagnosis ED Disposition     ED Disposition  Admit   Condition  --   Comment  Hospital Area: [facility] [[id number]]  Level of Care: Telemetry [5]  Admit to tele based on following criteria: Other see comments  Comments: infection  May admit patient to [facility] or [facility] if equivalent level of care is available:: No  Covid Evaluation: Asymptomatic - no recent exposure (last 10 days) testing not required  Diagnosis: CAP (community acquired pneumonia) [782956]  Admitting Physician: Lurline Del [[id number]]  Attending Physician: Lurline Del [[id number]]  Certification:: I certify this patient will need inpatient services for at least 2 midnights          Medical History Past Medical History:  Diagnosis Date   Cancer determined by prostate biopsy (HCC)    Depression    Hypercholesterolemia    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   PTSD (post-traumatic stress disorder)     Allergies No Known Allergies  IV Location/Drains/Wounds Patient Lines/Drains/Airways Status     Active Line/Drains/Airways     Name Placement date Placement time Site Days   Peripheral IV 05/03/23 20 G 1" Left Antecubital 05/03/23  2331  Antecubital  1            Labs/Imaging Results for orders placed or performed  during the hospital encounter of 05/03/23 (from the past 48 hour(s))  Basic metabolic panel     Status: Abnormal   Collection Time: 05/03/23 11:20 PM  Result Value Ref Range   Sodium 140 135 - 145 mmol/L   Potassium 4.5 3.5 - 5.1 mmol/L   Chloride 111 98 - 111 mmol/L   CO2 19 (L) 22 - 32 mmol/L   Glucose, Bld 121 (H) 70 - 99 mg/dL    Comment: Glucose reference range applies only to samples taken after fasting for at least 8 hours.   BUN 46 (H) 8 - 23 mg/dL   Creatinine, Ser 9.52 (H) 0.61 - 1.24 mg/dL   Calcium 8.7 (L) 8.9 - 10.3 mg/dL   GFR, Estimated 14 (L) >60 mL/min    Comment: (NOTE) Calculated using the CKD-EPI Creatinine Equation (2021)    Anion gap 10 5 - 15    Comment: Performed at [facility], [address] [place], [place] [address]  CBC with Differential     Status: Abnormal   Collection Time: 05/03/23 11:20 PM  Result Value Ref Range   WBC 7.0 4.0 - 10.5 K/uL   RBC 3.24 (L) 4.22 - 5.81 MIL/uL   Hemoglobin 9.3 (L) 13.0 - 17.0 g/dL   HCT 44.0 (L) 10.2 - 72.5 %  MCV 82.4 80.0 - 100.0 fL   MCH 28.7 26.0 - 34.0 pg   MCHC 34.8 30.0 - 36.0 g/dL   RDW 16.1 09.6 - 04.5 %   Platelets 197 150 - 400 K/uL   nRBC 0.0 0.0 - 0.2 %   Neutrophils Relative % 73 %   Neutro Abs 5.2 1.7 - 7.7 K/uL   Lymphocytes Relative 16 %   Lymphs Abs 1.1 0.7 - 4.0 K/uL   Monocytes Relative 8 %   Monocytes Absolute 0.5 0.1 - 1.0 K/uL   Eosinophils Relative 2 %   Eosinophils Absolute 0.1 0.0 - 0.5 K/uL   Basophils Relative 0 %   Basophils Absolute 0.0 0.0 - 0.1 K/uL   Immature Granulocytes 1 %   Abs Immature Granulocytes 0.04 0.00 - 0.07 K/uL    Comment: Performed at [facility], [address], [place], [place] [address]  Troponin I (High Sensitivity)     Status: None   Collection Time: 05/03/23 11:20 PM  Result Value Ref Range   Troponin I (High Sensitivity) 5 <18 ng/L    Comment: (NOTE) Elevated high sensitivity troponin I (hsTnI) values and  significant  changes across serial measurements may suggest ACS but many other  chronic and acute conditions are known to elevate hsTnI results.  Refer to the "Links" section for chest pain algorithms and additional  guidance. Performed at [facility], [address] [place], [place] [address]   Urinalysis, w/ Reflex to Culture (Infection Suspected) -Urine, Clean Catch     Status: Abnormal   Collection Time: 05/04/23  1:50 AM  Result Value Ref Range   Specimen Source URINE, CLEAN CATCH    Color, Urine STRAW (A) YELLOW   APPearance CLEAR CLEAR   Specific Gravity, Urine 1.009 1.005 - 1.030   pH 5.0 5.0 - 8.0   Glucose, UA NEGATIVE NEGATIVE mg/dL   Hgb urine dipstick SMALL (A) NEGATIVE   Bilirubin Urine NEGATIVE NEGATIVE   Ketones, ur NEGATIVE NEGATIVE mg/dL   Protein, ur NEGATIVE NEGATIVE mg/dL   Nitrite NEGATIVE NEGATIVE   Leukocytes,Ua NEGATIVE NEGATIVE   RBC / HPF 0-5 0 - 5 RBC/hpf   WBC, UA 0-5 0 - 5 WBC/hpf    Comment:        Reflex urine culture not performed if WBC <=10, OR if Squamous epithelial cells >5. If Squamous epithelial cells >5 suggest recollection.    Bacteria, UA RARE (A) NONE SEEN   Squamous Epithelial / HPF 0-5 0 - 5 /HPF   Mucus PRESENT     Comment: Performed at [facility], [address] [place], [place] [address]  Troponin I (High Sensitivity)     Status: None   Collection Time: 05/04/23  3:11 AM  Result Value Ref Range   Troponin I (High Sensitivity) 6 <18 ng/L    Comment: (NOTE) Elevated high sensitivity troponin I (hsTnI) values and significant  changes across serial measurements may suggest ACS but many other  chronic and acute conditions are known to elevate hsTnI results.  Refer to the "Links" section for chest pain algorithms and additional  guidance. Performed at [facility], [address], [place], [place] [address]   Strep pneumoniae urinary antigen     Status: None    Collection Time: 05/04/23  5:40 AM  Result Value Ref Range   Strep Pneumo Urinary Antigen NEGATIVE NEGATIVE    Comment:        Infection due to  S. pneumoniae cannot be absolutely ruled out since the antigen present may be below the detection limit of the test. Performed at [facility], [address], [place], [place] [address]   CBC     Status: Abnormal   Collection Time: 05/04/23  7:39 AM  Result Value Ref Range   WBC 6.3 4.0 - 10.5 K/uL   RBC 3.08 (L) 4.22 - 5.81 MIL/uL   Hemoglobin 8.8 (L) 13.0 - 17.0 g/dL   HCT 60.4 (L) 54.0 - 98.1 %   MCV 82.1 80.0 - 100.0 fL   MCH 28.6 26.0 - 34.0 pg   MCHC 34.8 30.0 - 36.0 g/dL   RDW 19.1 47.8 - 29.5 %   Platelets 184 150 - 400 K/uL   nRBC 0.0 0.0 - 0.2 %    Comment: Performed at [facility], [address] [place] [address] [place], [place] [address]  Reticulocytes     Status: Abnormal   Collection Time: 05/04/23  7:39 AM  Result Value Ref Range   Retic Ct Pct 0.8 0.4 - 3.1 %   RBC. 3.09 (L) 4.22 - 5.81 MIL/uL   Retic Count, Absolute 23.2 19.0 - 186.0 K/uL   Immature Retic Fract 17.6 (H) 2.3 - 15.9 %    Comment: Performed at [facility], [address] [place], [place] [address]   CT CHEST WO CONTRAST  Result Date: 05/04/2023 CLINICAL DATA:  Blunt chest trauma. EXAM: CT CHEST WITHOUT CONTRAST TECHNIQUE: Multidetector CT imaging of the chest was performed following the standard protocol without IV contrast. RADIATION DOSE REDUCTION: This exam was performed according to the departmental dose-optimization program which includes automated exposure control, adjustment of the mA and/or kV according to patient size and/or use of iterative reconstruction technique. COMPARISON:  Radiographs 05/03/2023 FINDINGS: Cardiovascular: Normal heart size. Small low-density mild coronary artery and aortic atherosclerotic calcification. Pericardial effusion. Mediastinum/Nodes: Trachea and esophagus are unremarkable. No thoracic  adenopathy. Lungs/Pleura: No pleural effusion or pneumothorax. Patchy ground-glass opacities in the posterior right upper lobe. Patchy nodular opacities in the superior segment left lower lobe. Bibasilar scarring. No pleural effusion or pneumothorax. Upper Abdomen: Cholelithiasis.  No acute abnormality. Musculoskeletal: No acute fracture. IMPRESSION: 1. Multifocal pneumonia. Follow-up in 6-8 weeks after treatment is recommended to ensure resolution. Aortic Atherosclerosis (ICD10-I70.0). Electronically Signed   By: Minerva Fester M.D.   On: 05/04/2023 02:15   DG Pelvis 1-2 Views  Result Date: 05/04/2023 CLINICAL DATA:  Fall, EXAM: PELVIS - 1-2 VIEW COMPARISON:  None Available. FINDINGS: Normal alignment. No acute fracture or dislocation. Hip and sacroiliac joint spaces are preserved. Brachytherapy seeds are seen within the expected prostate gland. Soft tissues are otherwise unremarkable. IMPRESSION: 1. Negative. Electronically Signed   By: Helyn Numbers M.D.   On: 05/04/2023 00:04   DG Knee Complete 4 Views Left  Result Date: 05/04/2023 CLINICAL DATA:  Larey Seat in driveway.  Bilateral knee pain. EXAM: LEFT KNEE - COMPLETE 4+ VIEW; RIGHT KNEE - COMPLETE 4+ VIEW COMPARISON:  None Available. FINDINGS: No acute fracture or dislocation of either knee. Small left and trace right knee joint effusions. Bilateral patellar enthesophytes. Mild medial compartment narrowing in the left knee. IMPRESSION: No acute fracture or dislocation. Electronically Signed   By: Minerva Fester M.D.   On: 05/04/2023 00:04   DG Knee Complete 4 Views Right  Result Date: 05/04/2023 CLINICAL DATA:  Larey Seat in driveway.  Bilateral knee pain. EXAM: LEFT KNEE - COMPLETE 4+ VIEW; RIGHT KNEE - COMPLETE 4+ VIEW  COMPARISON:  None Available. FINDINGS: No acute fracture or dislocation of either knee. Small left and trace right knee joint effusions. Bilateral patellar enthesophytes. Mild medial compartment narrowing in the left knee. IMPRESSION: No  acute fracture or dislocation. Electronically Signed   By: Minerva Fester M.D.   On: 05/04/2023 00:04   DG Chest 2 View  Result Date: 05/04/2023 CLINICAL DATA:  Larey Seat in driveway.  Mid chest pain. EXAM: CHEST - 2 VIEW COMPARISON:  Radiograph 12/01/2016 FINDINGS: Low lung volumes accentuate cardiomediastinal silhouette and pulmonary vascularity. Hazy opacities project over the midthoracic spine on lateral view without correlate on frontal view. No pleural effusion or pneumothorax. No displaced rib fractures. IMPRESSION: Hazy opacities project over the midthoracic spine on lateral view without frontal correlate. This could be projectional however pneumonia or contusion are not excluded. Consider chest CT for further evaluation. Electronically Signed   By: Minerva Fester M.D.   On: 05/04/2023 00:02   CT Head Wo Contrast  Result Date: 05/03/2023 CLINICAL DATA:  Head trauma, minor (Age >= 65y); Neck trauma (Age >= 65y), fall EXAM: CT HEAD WITHOUT CONTRAST CT CERVICAL SPINE WITHOUT CONTRAST TECHNIQUE: Multidetector CT imaging of the head and cervical spine was performed following the standard protocol without intravenous contrast. Multiplanar CT image reconstructions of the cervical spine were also generated. RADIATION DOSE REDUCTION: This exam was performed according to the departmental dose-optimization program which includes automated exposure control, adjustment of the mA and/or kV according to patient size and/or use of iterative reconstruction technique. COMPARISON:  12/11/2016 FINDINGS: CT HEAD FINDINGS Brain: Similar prior examination, there is moderate ventriculomegaly, stable since prior examination, which appears asymmetric with the degree of parenchymal volume loss. 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 No evidence of acute intracranial hemorrhage or infarct. No abnormal mass effect or midline  shift. No abnormal intra or extra-axial mass lesion. Ventricular size is normal. Vascular: No hyperdense vessel or unexpected calcification. Skull: Normal. Negative for fracture or focal lesion. Sinuses/Orbits: There is mucosal thickening within the visualized ethmoid air cells and maxillary sinuses. Orbits are unremarkable. Other: Mastoid air cells and middle ear cavities are clear. CT CERVICAL SPINE FINDINGS Alignment: Overall straightening of the cervical spine. No listhesis. Skull base and vertebrae: Craniocervical alignment is normal. The atlantodental interval is not widened. No acute fracture of the cervical spine vertebral body height is preserved. Soft tissues and spinal canal: No prevertebral fluid or swelling. No visible canal hematoma. 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 Disc levels: There is intervertebral disc space narrowing and exuberant osteophyte formation in keeping with changes of advanced degenerative disc disease. Degenerative ankylosis of the C4 and C5 vertebral bodies. The prevertebral soft tissues are not thickened on sagittal reformats. Multilevel uncovertebral arthrosis throughout the cervical spine results in multilevel severe neuroforaminal narrowing bilaterally. Upper chest: Negative. Other: None IMPRESSION: 1. No acute intracranial abnormality. No calvarial fracture. 2. No acute fracture or listhesis of the cervical spine. 3. 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 4. 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 Electronically Signed   By: Helyn Numbers M.D.   On: 05/03/2023 23:54   CT Cervical Spine Wo Contrast  Result Date: 05/03/2023 CLINICAL DATA:  Head trauma, minor (Age >= 65y); Neck trauma (Age >= 65y), fall EXAM: CT HEAD WITHOUT CONTRAST CT CERVICAL SPINE WITHOUT CONTRAST TECHNIQUE: Multidetector CT imaging of the head and cervical spine was performed following the standard protocol without intravenous contrast. Multiplanar CT image reconstructions of the cervical spine were also generated. RADIATION DOSE REDUCTION: This exam was performed according to the departmental dose-optimization program which includes automated exposure control, adjustment of the mA and/or kV according to patient size and/or use of iterative reconstruction technique. COMPARISON:  12/11/2016 FINDINGS: CT HEAD FINDINGS Brain: Similar prior examination, there is moderate ventriculomegaly, stable since prior examination, which appears asymmetric with the degree of parenchymal volume loss. 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 No evidence of acute intracranial hemorrhage or infarct. No abnormal mass effect or midline shift. No abnormal intra or extra-axial mass lesion. Ventricular size is normal. Vascular: No hyperdense vessel or unexpected calcification. Skull: Normal. Negative for fracture or focal lesion. Sinuses/Orbits: There is mucosal thickening within the visualized ethmoid air cells and maxillary sinuses. Orbits are unremarkable. Other: Mastoid air cells and middle ear cavities are clear. CT CERVICAL SPINE FINDINGS Alignment: Overall straightening of the cervical spine. No listhesis. Skull base and vertebrae: Craniocervical alignment is normal. The atlantodental interval is not widened. No acute fracture  of the cervical spine vertebral body height is preserved. Soft tissues and spinal canal: No prevertebral fluid or swelling. No visible canal hematoma. 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 Disc levels: There is intervertebral disc space narrowing and exuberant osteophyte formation in keeping with changes of advanced degenerative disc disease. Degenerative ankylosis of the C4 and C5 vertebral bodies. The prevertebral soft tissues are not thickened on sagittal reformats. Multilevel uncovertebral arthrosis throughout the cervical spine results in multilevel severe neuroforaminal narrowing bilaterally. Upper chest: Negative. Other: None IMPRESSION: 1. No acute intracranial abnormality. No calvarial fracture. 2. No acute fracture or listhesis of the cervical spine. 3. 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 4. 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 Electronically Signed   By: Helyn Numbers M.D.   On: 05/03/2023 23:54    Pending Labs Unresulted Labs (From admission, onward)     Start     Ordered   05/04/23 1000  Comprehensive metabolic panel  Tomorrow morning,   R        05/04/23 0413   05/04/23 0650  Vitamin B12  (Anemia Panel (PNL))  Once,   R        05/04/23 0649   05/04/23 0650  Folate  (Anemia Panel (PNL))  Once,   R        05/04/23 0649   05/04/23  1610  Iron and TIBC  (Anemia Panel (PNL))  Once,   R         05/04/23 0649   05/04/23 0650  Ferritin  (Anemia Panel (PNL))  Once,   R        05/04/23 0649   05/04/23 0407  Expectorated Sputum Assessment w Gram Stain, Rflx to Resp Cult  (COPD / Pneumonia / Cellulitis / Lower Extremity Wound)  Once,   R        05/04/23 0413   05/04/23 0407  Legionella Pneumophila Serogp 1 Ur Ag  (COPD / Pneumonia / Cellulitis / Lower Extremity Wound)  Once,   R        05/04/23 0413   05/04/23 0238  Blood culture (routine x 2)  BLOOD CULTURE X 2,   R (with STAT occurrences)      05/04/23 0237            Vitals/Pain Today's Vitals   05/04/23 0115 05/04/23 0230 05/04/23 0715 05/04/23 0744  BP: (!) 162/67 (!) 153/63 (!) 155/71 (!) 154/69  Pulse: 64 61 (!) 57 61  Resp: 20 17 18 18   Temp:  98.5 F (36.9 C)  (!) 97.5 F (36.4 C)  TempSrc:      SpO2: 100% 100% 99% 100%  PainSc:    0-No pain    Isolation Precautions No active isolations  Medications Medications  azithromycin (ZITHROMAX) 500 mg in sodium chloride 0.9 % 250 mL IVPB (has no administration in time range)  cefTRIAXone (ROCEPHIN) 2 g in sodium chloride 0.9 % 100 mL IVPB (has no administration in time range)  heparin injection 5,000 Units (5,000 Units Subcutaneous Given 05/04/23 0616)  ondansetron (ZOFRAN) tablet 4 mg (has no administration in time range)    Or  ondansetron (ZOFRAN) injection 4 mg (has no administration in time range)  albuterol (PROVENTIL) (2.5 MG/3ML) 0.083% nebulizer solution 2.5 mg (has no administration in time range)  0.9 %  sodium chloride infusion ( Intravenous New Bag/Given 05/04/23 0745)  sodium chloride 0.9 % bolus 500 mL (0 mLs Intravenous Stopped 05/04/23 0242)  cefTRIAXone (ROCEPHIN) 1 g in sodium chloride 0.9 % 100 mL IVPB (0 g Intravenous Stopped 05/04/23 0427)  azithromycin (ZITHROMAX) 500 mg in sodium chloride 0.9 % 250 mL IVPB (0 mg Intravenous Stopped 05/04/23 0616)    Mobility walks with person assist

## 2023-05-04 NOTE — Progress Notes (Signed)
This is a pleasant 82 year old gentleman with history of prostate cancer, hyperlipidemia, PTSD, who recently had EGD and colonoscopy at [place] on 04/27/2023 and has remained confused ever since and had a fall on 1624 and was brought into the emergency department.  No fracture was found on all the imaging studies but he was found to have incidental bilateral community-acquired pneumonia and was admitted under hospital service after midnight and started on Rocephin and Zithromax.  I saw patient in the ED.  He said that he was feeling well.  He had no complaint, no pain, no shortness of breath or cough.  He appeared very comfortable.  Lungs clear to auscultation.  We are going to continue current antibiotics and consult PT OT.  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  His baseline creatinine appears to be around 1.6-1.9 but presented with 4.07 which is already improving.  Continue IV fluids.

## 2023-05-04 NOTE — H&P (Signed)
History and Physical    Lance Todd ZOX:[id number] DOB: [birth year] DOA: 05/03/2023  PCP: [name], MD (Inactive)  Patient coming from: home I have personally briefly reviewed patient's old medical records in [facility]  Chief Complaint: confusion  HPI: Lance Todd is a 82 y.o. male with medical history significant of  Prostate cancer, HLD,  OAB, CLBP depression, PTSD, HLD ,CKDIII-IV,with interim history of recent EGD/Cscope at [place] on 6/4 who presents to ED with history of fall , confusion as well as cough and congestion since egd /cscope.  Per daughter who gives history, after his EGD/Scope patient was noted to have increase confusion and somnolence. She [facility] for [facility] to have confusion s/p conscious sedation. Of note during that time patient was still taking his trazadone and seroquel.  Patient also was noted to have increase congestion which  patient was seen by UC and prescribed antibiotics which daughter states patient did not take.  ON day of admission EMS was called due to patient having fall  while attempting to get his mail.  Patient currently is pleasant and denies any current chest pain , sob , f/c/ n/v/d .  He is alert and oriented to self, time and know he is in a hospital.  ED Course: IN ED patient was evaluated and found to have multifocal pna  Vitals:  Afeb, bp 117/61, hr 71, rr 16 ,s at 100%  EKG:nsr no hyper acute st -twave changes  Wbc 7, hgb 9.3 ( 11) , plt 197 N a140, K 4.5, cl 111, bicarb 19  glu 121 cr 4.05 9 baseline (3) CE5,6    CTH/Cervical spine: NAD . No acute intracranial abnormality. No calvarial fracture. 2. No acute fracture or listhesis of the cervical spine. 3. 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 4. 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  Cxr:  Hazy opacities project over the midthoracic spine on lateral view without frontal correlate. This could be projectional however pneumonia or contusion are not excluded. Consider chest CT for further evaluation.  CT chest  IMPRESSION: 1. Multifocal pneumonia. Follow-up in 6-8 weeks after treatment is recommended to ensure resolution.   Review of Systems: As per HPI otherwise 10 point review of systems negative.   Past Medical History:  Diagnosis Date   Cancer determined by prostate biopsy (HCC)    Depression    Hypercholesterolemia    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   PTSD (post-traumatic stress disorder)     Past Surgical History:  Procedure Laterality Date   PROSTATE SURGERY       reports that he has never smoked. He has never used smokeless tobacco. He reports that he does not drink alcohol and does not use drugs.  No Known Allergies  Family History  Problem Relation Age of Onset   Seizures Sister     Prior to Admission medications   Medication Sig Start Date End Date Taking? Authorizing Provider  atorvastatin (LIPITOR) 10 MG tablet Take 10 mg by mouth at bedtime.   Yes [provider]  benzonatate (TESSALON) 100 MG capsule Take 100 mg by mouth 3 (three) times daily as needed for cough.  Yes [provider]  calcitRIOL (ROCALTROL) 0.25 MCG capsule Take 0.25 mcg by mouth daily.   Yes [provider]  CALCIUM-VITAMIN D PO Take 1 tablet by mouth daily.   Yes [provider]  carboxymethylcellulose (REFRESH PLUS) 0.5 % SOLN Place 1 drop into both eyes 4 (four) times daily as needed (dry eyes).   Yes [provider]  cetirizine (ZYRTEC) 10 MG tablet Take 10 mg by mouth daily as needed for allergies.   Yes [provider]  chlorpheniramine (CHLOR-TRIMETON) 4 MG tablet Take 4 mg by mouth every 4 (four) hours as needed for allergies.   Yes [provider]  Cholecalciferol (VITAMIN D-3) 1000 units CAPS Take 1 capsule by mouth daily.   Yes [provider]  doxycycline (DORYX) 100 MG EC tablet Take 100 mg by mouth 2 (two) times daily.   Yes [provider]  fluticasone (FLONASE) 50 MCG/ACT nasal spray Place 2 sprays into both nostrils 4 (four) times daily as needed for allergies.   Yes [provider]  ketotifen (ZADITOR) 0.025 % ophthalmic solution Place 1 drop into both eyes 2 (two) times daily.   Yes [provider]  losartan (COZAAR) 25 MG tablet Take 25 mg by mouth daily.   Yes [provider]  mirabegron ER (MYRBETRIQ) 25 MG TB24 tablet Take 25 mg by mouth daily. STOP THIS MEDICATION WITH ALLERGIC SYMPTOMS OR BLOOD PRESSURE 150/100 OR HIGHER   Yes [provider]  pseudoephedrine (SUDAFED) 60 MG tablet Take 60 mg by mouth every 6 (six) hours as needed for congestion.   Yes [provider]  tamsulosin (FLOMAX) 0.4 MG CAPS capsule Take 0.4 mg by mouth at bedtime. 08/31/13  Yes [provider]  traZODone (DESYREL) 50 MG tablet Take 50 mg by mouth at bedtime.   Yes [provider]  vardenafil (LEVITRA) 20 MG tablet Take 20 mg by mouth daily as needed for erectile dysfunction.   Yes [provider]  lacosamide (VIMPAT) 50 MG TABS tablet Take 1 tablet (50 mg total) by mouth 2 (two) times daily. Patient not taking: Reported on 05/04/2023 12/09/16   Kimber Relic, MD  QUEtiapine (SEROQUEL) 25 MG tablet Take 1 tablet (25 mg total) by mouth at bedtime. Patient not taking: Reported on 05/04/2023 12/14/16   Ngetich, Donalee Citrin, NP    Physical Exam: Vitals:   05/03/23 2249 05/04/23 0115 05/04/23 0230  BP: 117/61 (!) 162/67 (!) 153/63  Pulse: 71 64 61  Resp: 16 20 17   Temp: 98.5 F (36.9 C)  98.5 F (36.9 C)   TempSrc: Oral    SpO2: 100% 100% 100%    Constitutional: NAD, calm, comfortable, somewhat smnolent Vitals:   05/03/23 2249 05/04/23 0115 05/04/23 0230  BP: 117/61 (!) 162/67 (!) 153/63  Pulse: 71 64 61  Resp: 16 20 17   Temp: 98.5 F (36.9 C)  98.5 F (36.9 C)  TempSrc: Oral    SpO2: 100% 100% 100%   Eyes: PERRL, lids and conjunctivae normal ENMT: Mucous membranes are moist. Posterior pharynx clear of any exudate or lesions.Normal dentition.  Neck: normal, supple, no masses, no thyromegaly Respiratory: clear to auscultation bilaterally, no wheezing, no crackles. Normal respiratory effort. No accessory muscle use.  Cardiovascular: Regular rate and rhythm, no murmurs / rubs / gallops. No extremity edema. 2+ pedal pulses. N Abdomen: no tenderness, no masses palpated. No hepatosplenomegaly. Bowel sounds positive.  Musculoskeletal: no clubbing / cyanosis. No joint deformity upper and lower extremities.  Good ROM, no contractures. Normal muscle tone.  Skin: no rashes, lesions, ulcers. No induration Neurologic: CN 2-12 grossly intact. Sensation intact,  Strength 5/5 in all 4.  Psychiatric: Normal judgment and insight. Alert and oriented x 3. Normal mood.    Labs on Admission: I have personally reviewed following labs and imaging studies  CBC: Recent Labs  Lab 05/03/23 2320  WBC 7.0  NEUTROABS 5.2  HGB 9.3*  HCT 26.7*  MCV 82.4  PLT 197   Basic Metabolic Panel: Recent Labs  Lab 05/03/23 2320  NA 140  K 4.5  CL 111  CO2 19*  GLUCOSE 121*  BUN 46*  CREATININE 4.05*  CALCIUM 8.7*   GFR: CrCl cannot be calculated (Unknown ideal weight.). Liver Function Tests: No results for input(s): "AST", "ALT", "ALKPHOS", "BILITOT", "PROT", "ALBUMIN" in the last 168 hours. No results for input(s): "LIPASE", "AMYLASE" in the last 168 hours. No results for input(s): "AMMONIA" in the last 168 hours. Coagulation Profile: No results for input(s): "INR", "PROTIME" in the last 168  hours. Cardiac Enzymes: No results for input(s): "CKTOTAL", "CKMB", "CKMBINDEX", "TROPONINI" in the last 168 hours. BNP (last 3 results) No results for input(s): "PROBNP" in the last 8760 hours. HbA1C: No results for input(s): "HGBA1C" in the last 72 hours. CBG: No results for input(s): "GLUCAP" in the last 168 hours. Lipid Profile: No results for input(s): "CHOL", "HDL", "LDLCALC", "TRIG", "CHOLHDL", "LDLDIRECT" in the last 72 hours. Thyroid Function Tests: No results for input(s): "TSH", "T4TOTAL", "FREET4", "T3FREE", "THYROIDAB" in the last 72 hours. Anemia Panel: No results for input(s): "VITAMINB12", "FOLATE", "FERRITIN", "TIBC", "IRON", "RETICCTPCT" in the last 72 hours. Urine analysis:    Component Value Date/Time   COLORURINE STRAW (A) 05/04/2023 0150   APPEARANCEUR CLEAR 05/04/2023 0150   LABSPEC 1.009 05/04/2023 0150   PHURINE 5.0 05/04/2023 0150   GLUCOSEU NEGATIVE 05/04/2023 0150   HGBUR SMALL (A) 05/04/2023 0150   BILIRUBINUR NEGATIVE 05/04/2023 0150   KETONESUR NEGATIVE 05/04/2023 0150   PROTEINUR NEGATIVE 05/04/2023 0150   NITRITE NEGATIVE 05/04/2023 0150   LEUKOCYTESUR NEGATIVE 05/04/2023 0150    Radiological Exams on Admission: CT CHEST WO CONTRAST  Result Date: 05/04/2023 CLINICAL DATA:  Blunt chest trauma. EXAM: CT CHEST WITHOUT CONTRAST TECHNIQUE: Multidetector CT imaging of the chest was performed following the standard protocol without IV contrast. RADIATION DOSE REDUCTION: This exam was performed according to the departmental dose-optimization program which includes automated exposure control, adjustment of the mA and/or kV according to patient size and/or use of iterative reconstruction technique. COMPARISON:  Radiographs 05/03/2023 FINDINGS: Cardiovascular: Normal heart size. Small low-density mild coronary artery and aortic atherosclerotic calcification. Pericardial effusion. Mediastinum/Nodes: Trachea and esophagus are unremarkable. No thoracic adenopathy.  Lungs/Pleura: No pleural effusion or pneumothorax. Patchy ground-glass opacities in the posterior right upper lobe. Patchy nodular opacities in the superior segment left lower lobe. Bibasilar scarring. No pleural effusion or pneumothorax. Upper Abdomen: Cholelithiasis.  No acute abnormality. Musculoskeletal: No acute fracture. IMPRESSION: 1. Multifocal pneumonia. Follow-up in 6-8 weeks after treatment is recommended to ensure resolution. Aortic Atherosclerosis (ICD10-I70.0). Electronically Signed   By: Minerva Fester M.D.   On: 05/04/2023 02:15   DG Pelvis 1-2 Views  Result Date: 05/04/2023 CLINICAL DATA:  Fall, EXAM: PELVIS - 1-2 VIEW COMPARISON:  None Available. FINDINGS: Normal alignment. No acute fracture or dislocation. Hip and sacroiliac joint spaces are preserved. Brachytherapy seeds are seen within the expected prostate gland. Soft tissues are otherwise unremarkable. IMPRESSION: 1. Negative. Electronically Signed   By: Gloris Ham  Ramiro Harvest M.D.   On: 05/04/2023 00:04   DG Knee Complete 4 Views Left  Result Date: 05/04/2023 CLINICAL DATA:  Larey Seat in driveway.  Bilateral knee pain. EXAM: LEFT KNEE - COMPLETE 4+ VIEW; RIGHT KNEE - COMPLETE 4+ VIEW COMPARISON:  None Available. FINDINGS: No acute fracture or dislocation of either knee. Small left and trace right knee joint effusions. Bilateral patellar enthesophytes. Mild medial compartment narrowing in the left knee. IMPRESSION: No acute fracture or dislocation. Electronically Signed   By: Minerva Fester M.D.   On: 05/04/2023 00:04   DG Knee Complete 4 Views Right  Result Date: 05/04/2023 CLINICAL DATA:  Larey Seat in driveway.  Bilateral knee pain. EXAM: LEFT KNEE - COMPLETE 4+ VIEW; RIGHT KNEE - COMPLETE 4+ VIEW COMPARISON:  None Available. FINDINGS: No acute fracture or dislocation of either knee. Small left and trace right knee joint effusions. Bilateral patellar enthesophytes. Mild medial compartment narrowing in the left knee. IMPRESSION: No acute fracture  or dislocation. Electronically Signed   By: Minerva Fester M.D.   On: 05/04/2023 00:04   DG Chest 2 View  Result Date: 05/04/2023 CLINICAL DATA:  Larey Seat in driveway.  Mid chest pain. EXAM: CHEST - 2 VIEW COMPARISON:  Radiograph 12/01/2016 FINDINGS: Low lung volumes accentuate cardiomediastinal silhouette and pulmonary vascularity. Hazy opacities project over the midthoracic spine on lateral view without correlate on frontal view. No pleural effusion or pneumothorax. No displaced rib fractures. IMPRESSION: Hazy opacities project over the midthoracic spine on lateral view without frontal correlate. This could be projectional however pneumonia or contusion are not excluded. Consider chest CT for further evaluation. Electronically Signed   By: Minerva Fester M.D.   On: 05/04/2023 00:02   CT Head Wo Contrast  Result Date: 05/03/2023 CLINICAL DATA:  Head trauma, minor (Age >= 65y); Neck trauma (Age >= 65y), fall EXAM: CT HEAD WITHOUT CONTRAST CT CERVICAL SPINE WITHOUT CONTRAST TECHNIQUE: Multidetector CT imaging of the head and cervical spine was performed following the standard protocol without intravenous contrast. Multiplanar CT image reconstructions of the cervical spine were also generated. RADIATION DOSE REDUCTION: This exam was performed according to the departmental dose-optimization program which includes automated exposure control, adjustment of the mA and/or kV according to patient size and/or use of iterative reconstruction technique. COMPARISON:  12/11/2016 FINDINGS: CT HEAD FINDINGS Brain: Similar prior examination, there is moderate ventriculomegaly, stable since prior examination, which appears asymmetric with the degree of parenchymal volume loss. 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 No evidence of acute intracranial hemorrhage or infarct. No abnormal mass effect or midline shift. No  abnormal intra or extra-axial mass lesion. Ventricular size is normal. Vascular: No hyperdense vessel or unexpected calcification. Skull: Normal. Negative for fracture or focal lesion. Sinuses/Orbits: There is mucosal thickening within the visualized ethmoid air cells and maxillary sinuses. Orbits are unremarkable. Other: Mastoid air cells and middle ear cavities are clear. CT CERVICAL SPINE FINDINGS Alignment: Overall straightening of the cervical spine. No listhesis. Skull base and vertebrae: Craniocervical alignment is normal. The atlantodental interval is not widened. No acute fracture of the cervical spine vertebral body height is preserved. Soft tissues and spinal canal: No prevertebral fluid or swelling. No visible canal hematoma. 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 Disc levels: There is intervertebral disc space narrowing and exuberant osteophyte formation in keeping with changes of advanced degenerative disc disease. Degenerative ankylosis of the C4 and C5 vertebral bodies. The prevertebral soft tissues are not thickened on sagittal reformats. Multilevel uncovertebral arthrosis throughout the cervical spine results in multilevel severe neuroforaminal narrowing bilaterally. Upper chest: Negative. Other: None IMPRESSION: 1. No acute intracranial abnormality. No calvarial fracture. 2. No acute fracture or listhesis of the cervical spine. 3. 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 4. 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 Electronically Signed   By: Helyn Numbers M.D.   On: 05/03/2023 23:54   CT Cervical Spine Wo Contrast  Result Date: 05/03/2023 CLINICAL DATA:  Head trauma, minor (Age >= 65y); Neck trauma (Age >= 65y), fall EXAM: CT HEAD WITHOUT CONTRAST CT CERVICAL SPINE WITHOUT CONTRAST TECHNIQUE: Multidetector CT imaging of the head and cervical spine was performed following the standard protocol without intravenous contrast. Multiplanar CT image reconstructions of the cervical spine were also generated. RADIATION DOSE REDUCTION: This exam was performed according to the departmental dose-optimization program which includes automated exposure control, adjustment of the mA and/or kV according to patient size and/or use of iterative reconstruction technique. COMPARISON:  12/11/2016 FINDINGS: CT HEAD FINDINGS Brain: Similar prior examination, there is moderate ventriculomegaly, stable since prior examination, which appears asymmetric with the degree of parenchymal volume loss. 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 No evidence of acute intracranial hemorrhage or infarct. No abnormal mass effect or midline shift. No abnormal intra or extra-axial mass lesion. Ventricular size is normal. Vascular: No hyperdense vessel or unexpected calcification. Skull: Normal. Negative for fracture or focal lesion. Sinuses/Orbits: There is mucosal thickening within the visualized ethmoid air cells and maxillary sinuses. Orbits are unremarkable. Other: Mastoid air cells and middle ear cavities are clear. CT CERVICAL SPINE FINDINGS Alignment: Overall straightening of the cervical spine. No listhesis. Skull base and vertebrae: Craniocervical alignment is normal. The atlantodental interval is not widened. No acute fracture of the  cervical spine vertebral body height is preserved. Soft tissues and spinal canal: No prevertebral fluid or swelling. No visible canal hematoma. 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 Disc levels: There is intervertebral disc space narrowing and exuberant osteophyte formation in keeping with changes of advanced degenerative disc disease. Degenerative ankylosis of the C4 and C5 vertebral bodies. The prevertebral soft tissues are not thickened on sagittal reformats. Multilevel uncovertebral arthrosis throughout the cervical spine results in multilevel severe neuroforaminal narrowing bilaterally. Upper chest: Negative. Other: None IMPRESSION: 1. No acute intracranial abnormality. No calvarial fracture. 2. No acute fracture or listhesis of the cervical spine. 3. 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 4. 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 Electronically Signed   By: Helyn Numbers M.D.   On: 05/03/2023 23:54    EKG: Independently reviewed.   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 Due to CKD -check anemia panel   HLD -resume statin   OAB -hold regimen for now   CLBP  -noted some back pain but tolerable -supportive care with heating pain , lidocaine  patches  -avoid sedative medication s  Gastritis  -noted on recent endoscopy  - ppi  DVT prophylaxis:  Code Status:scd Family CommunicationDelton Todd [phone] (Home Phone)   Disposition Plan: patient  expected to be admitted greater than 2 midnights  Consults called: n/a Admission status: med tele   Lurline Del MD [facility]  If 7PM-7AM, please contact night-coverage [email or web address] Password [facility]  05/04/2023, 5:46 AM

## 2023-05-05 DIAGNOSIS — C61 Malignant neoplasm of prostate: Secondary | ICD-10-CM

## 2023-05-05 DIAGNOSIS — G9341 Metabolic encephalopathy: Secondary | ICD-10-CM | POA: Diagnosis not present

## 2023-05-05 DIAGNOSIS — I10 Essential (primary) hypertension: Secondary | ICD-10-CM

## 2023-05-05 DIAGNOSIS — J9601 Acute respiratory failure with hypoxia: Secondary | ICD-10-CM | POA: Diagnosis not present

## 2023-05-05 DIAGNOSIS — F4310 Post-traumatic stress disorder, unspecified: Secondary | ICD-10-CM

## 2023-05-05 DIAGNOSIS — N1832 Chronic kidney disease, stage 3b: Secondary | ICD-10-CM

## 2023-05-05 DIAGNOSIS — E782 Mixed hyperlipidemia: Secondary | ICD-10-CM

## 2023-05-05 DIAGNOSIS — N179 Acute kidney failure, unspecified: Secondary | ICD-10-CM | POA: Diagnosis not present

## 2023-05-05 LAB — BASIC METABOLIC PANEL
Anion gap: 8 (ref 5–15)
BUN: 43 mg/dL — ABNORMAL HIGH (ref 8–23)
CO2: 19 mmol/L — ABNORMAL LOW (ref 22–32)
Calcium: 7.9 mg/dL — ABNORMAL LOW (ref 8.9–10.3)
Chloride: 110 mmol/L (ref 98–111)
Creatinine, Ser: 3.29 mg/dL — ABNORMAL HIGH (ref 0.61–1.24)
GFR, Estimated: 18 mL/min — ABNORMAL LOW (ref >60)
Glucose, Bld: 110 mg/dL — ABNORMAL HIGH (ref 70–99)
Potassium: 4.3 mmol/L (ref 3.5–5.1)
Sodium: 137 mmol/L (ref 135–145)

## 2023-05-05 LAB — LEGIONELLA PNEUMOPHILA SEROGP 1 UR AG: L. pneumophila Serogp 1 Ur Ag: NEGATIVE

## 2023-05-05 MED ORDER — AZITHROMYCIN 250 MG PO TABS
500.0000 mg | ORAL_TABLET | Freq: Every day | ORAL | Status: DC
  Administered 2023-05-06 – 2023-05-07 (×2): 500 mg via ORAL
  Filled 2023-05-05 (×2): qty 2

## 2023-05-05 NOTE — Evaluation (Addendum)
Occupational Therapy Evaluation Patient Details Name: Lance Todd MRN: [id number] DOB: 06/21/1941 Today's Date: 05/05/2023   History of Present Illness Pt is an 82 y/o M admitted on 05/03/23 after presenting to the hospital with c/o a fall, confusion, cough & congestion. CT chest showed multifocal PNA. PMH: prostate CA, HLD, OAB, CLBP depression, PTSD, HLD, CKD 3-4   Clinical Impression   The pt currently requires SBA for most tasks, including bed mobility, lower body dressing, sit to stand, and transferring to the bedside chair. He appeared to be with slight deconditioning, mild generalized weakness, and occasional unsteadiness in dynamic standing, which compromises his ADL performance. He will benefit from further OT services in the acute setting to maximize his safety and independence with ADLs, and to facilitate his safe return home.       Recommendations for follow up therapy are one component of a multi-disciplinary discharge planning process, led by the attending physician.  Recommendations may be updated based on patient status, additional functional criteria and insurance authorization.   Assistance Recommended at Discharge PRN  Patient can return home with the following Help with stairs or ramp for entrance;Direct supervision/assist for medications management;Assistance with cooking/housework    Functional Status Assessment  Patient has had a recent decline in their functional status and demonstrates the ability to make significant improvements in function in a reasonable and predictable amount of time.  Equipment Recommendations  None recommended by OT       Precautions / Restrictions Precautions Precautions: Fall Restrictions Weight Bearing Restrictions: No      Mobility Bed Mobility Overal bed mobility: Needs Assistance Bed Mobility: Supine to Sit     Supine to sit: Supervision          Transfers Overall transfer level: Needs assistance Equipment used:  None Transfers: Sit to/from Stand Sit to Stand: Supervision                  Balance     Sitting balance-Leahy Scale: Good       Standing balance-Leahy Scale: Fair             ADL either performed or assessed with clinical judgement   ADL Overall ADL's : Needs assistance/impaired;Independent Eating/Feeding: Sitting;Independent Eating/Feeding Details (indicate cue type and reason): He initiated self-feeding seated in the chair Grooming: Supervision/safety;Standing Grooming Details (indicate cue type and reason): at sink level, based on clinical judgement         Upper Body Dressing : Set up;Sitting Upper Body Dressing Details (indicate cue type and reason): simulated Lower Body Dressing: Supervision/safety;Sitting/lateral leans Lower Body Dressing Details (indicate cue type and reason): He implemented the figure 4 technique, in order to doff then donn his socks seated EOB.                                  Pertinent Vitals/Pain Pain Assessment Pain Assessment: No/denies pain     Hand Dominance Right   Extremity/Trunk Assessment Upper Extremity Assessment Upper Extremity Assessment: Overall WFL for tasks assessed   Lower Extremity Assessment Lower Extremity Assessment: Overall WFL for tasks assessed       Communication Communication Communication: No difficulties   Cognition Arousal/Alertness: Awake/alert Behavior During Therapy: WFL for tasks assessed/performed        General Comments: Orientedx4, able to follow 1-2 step commands consistently                Home Living  Family/patient expects to be discharged to:: Private residence Living Arrangements: Spouse/significant other Available Help at Discharge: Family Type of Home: House Home Access: Stairs to enter Secretary/administrator of Steps: 2   Home Layout: One level               Home Equipment: None          Prior Functioning/Environment Prior Level of  Function : Independent/Modified Independent;Driving             Mobility Comments:  (He ambulated independently.) ADLs Comments: He was independent with ADLs, cooking, cleaning, and driving.        OT Problem List: Decreased strength;Impaired balance (sitting and/or standing);Decreased cognition;Decreased knowledge of use of DME or AE      OT Treatment/Interventions: Self-care/ADL training;Therapeutic exercise;Energy conservation;DME and/or AE instruction;Cognitive remediation/compensation;Patient/family education;Balance training    OT Goals(Current goals can be found in the care plan section) Acute Rehab OT Goals OT Goal Formulation: With patient Time For Goal Achievement: 05/19/23 Potential to Achieve Goals: Good ADL Goals Pt Will Perform Grooming: Independently;standing Pt Will Perform Lower Body Dressing: Independently;sit to/from stand Pt Will Transfer to Toilet: Independently;ambulating Pt Will Perform Toileting - Clothing Manipulation and hygiene: Independently;sit to/from stand  OT Frequency: Min 1X/week       AM-PAC OT "6 Clicks" Daily Activity     Outcome Measure Help from another person eating meals?: None Help from another person taking care of personal grooming?: None Help from another person toileting, which includes using toliet, bedpan, or urinal?: A Little Help from another person bathing (including washing, rinsing, drying)?: A Little Help from another person to put on and taking off regular upper body clothing?: None Help from another person to put on and taking off regular lower body clothing?: A Little 6 Click Score: 21   End of Session Equipment Utilized During Treatment: Other (comment) (none) Nurse Communication: Mobility status  Activity Tolerance: Patient tolerated treatment well Patient left: in chair;with call bell/phone within reach;with chair alarm set  OT Visit Diagnosis: Unsteadiness on feet (R26.81);Muscle weakness (generalized) (M62.81)                 Time: 8469-6295 OT Time Calculation (min): 15 min Charges:  OT General Charges $OT Visit: 1 Visit OT Evaluation $OT Eval Low Complexity: 1 Low    Daejah Klebba L Sumaya Riedesel, OTR/L 05/05/2023, 12:35 PM

## 2023-05-05 NOTE — Progress Notes (Addendum)
PROGRESS NOTE    Lance Todd  ZOX:[id number] DOB: 1941/07/17 DOA: 05/03/2023 PCP: [name], MD (Inactive)   Brief Narrative:  Lance Todd is a 82 y.o. male with medical history significant of  Prostate cancer, HLD,  OAB, CLBP depression, PTSD, HLD ,CKDIII-IV,with interim history of recent EGD/Cscope at [place] on 6/4 who presents to ED after a fall. Recently more confused after upper endoscopy at [facility]. Imaging at intake consistent with multifocal pneumonia.  Assessment & Plan:   Principal Problem:   CAP (community acquired pneumonia) Active Problems:   Prostate cancer (HCC)   HLD (hyperlipidemia)   PTSD (post-traumatic stress disorder)   Acute renal failure superimposed on stage 3b chronic kidney disease (HCC)   Acute metabolic encephalopathy   Essential hypertension   Sepsis secondary to multilobar CAP presumed bacterial  -Without hypoxia, tachypnea and altered mental status - presumed source of pneumonia -Noted on imaging - consider aspiration given mental status as below -Ceftriaxone/Azithromycin ongoing, de-escalate as able  -pulmonary toilet  -urine ag, sputum, f/u on culture data  -Respiratory panel pending    Acute metabolic encephalopathy  -multifactorial due to infection in setting of sedative medications and aki on CKD -hold sedative medication, treat underlying cause    AKI on presumed CKD4 - Unknown baseline - last available labs are 2018 with creatinine around 2 - Secondary to poor PO intake before/after recent procedure - Continue IVF until PO intake improves appropriately    Depression/PTSD -holding sedative medication seroquel, consider restarting in the next 24h  Prostate cancer -s/p brachytherapy    Anemia ,Normocytic -Likely anemia of chronic disease given CKD, iron panel unremarkable other than depressed TIBC  HLD -resume statin    Over active bladder -hold regimen for now    Chronic lumbago -Avoid sedation given above -supportive  care with heating pain , lidocaine  patches  -avoid sedative medications  Gastritis  -noted on recent endoscopy  - ppi ongoing  DVT prophylaxis: SCDs Start: 05/04/23 0409 heparin injection 5,000 Units Start: 05/04/23 0408 Code Status:   Code Status: Full Code Family Communication: None present - attempted call to Karen-no answer  Status is: Inpt  Dispo: The patient is from: Home              Anticipated d/c is to: Home              Anticipated d/c date is: 24-48h              Patient currently NOT medically stable for discharge  Consultants:  None  Procedures:  None  Antimicrobials:  Ceftriaxone x3 days   Subjective: No acute issues or events overnight, mental status appears to be improving although likely not yet back to baseline.  Review of systems limited but denies chest pain shortness of breath headache fevers or chills  Objective: Vitals:   05/04/23 1257 05/04/23 1723 05/04/23 2156 05/05/23 0521  BP: (!) 117/57 (!) 127/58 (!) 159/66 (!) 155/64  Pulse: 67 61 60 64  Resp: 18 18 18 18   Temp: (!) 97.4 F (36.3 C) (!) 97.5 F (36.4 C) 98.3 F (36.8 C) 98.2 F (36.8 C)  TempSrc:  Oral Oral Oral  SpO2: 90% 100% 100% 100%  Height:        Intake/Output Summary (Last 24 hours) at 05/05/2023 0816 Last data filed at 05/04/2023 1618 Gross per 24 hour  Intake 300 ml  Output 200 ml  Net 100 ml   There were no vitals filed for this  visit.  Examination:  General:  Pleasantly resting in bed, No acute distress. HEENT:  Normocephalic atraumatic.  Sclerae nonicteric, noninjected.  Extraocular movements intact bilaterally. Neck:  Without mass or deformity.  Trachea is midline. Lungs:  Clear to auscultate bilaterally without rhonchi, wheeze, or rales. Heart:  Regular rate and rhythm.  Without murmurs, rubs, or gallops. Abdomen:  Soft, nontender, nondistended.  Without guarding or rebound. Extremities: Without cyanosis, clubbing, edema, or obvious deformity. Skin:  Warm  and dry, no erythema  Data Reviewed: I have personally reviewed following labs and imaging studies  CBC: Recent Labs  Lab 05/03/23 2320 05/04/23 0739  WBC 7.0 6.3  NEUTROABS 5.2  --   HGB 9.3* 8.8*  HCT 26.7* 25.3*  MCV 82.4 82.1  PLT 197 184   Basic Metabolic Panel: Recent Labs  Lab 05/03/23 2320 05/04/23 0739 05/05/23 0552  NA 140 140 137  K 4.5 4.3 4.3  CL 111 112* 110  CO2 19* 19* 19*  GLUCOSE 121* 100* 110*  BUN 46* 46* 43*  CREATININE 4.05* 3.57* 3.29*  CALCIUM 8.7* 8.2* 7.9*   GFR: CrCl cannot be calculated (Unknown ideal weight.). Liver Function Tests: Recent Labs  Lab 05/04/23 0739  AST 15  ALT 14  ALKPHOS 32*  BILITOT 0.7  PROT 6.3*  ALBUMIN 2.9*   No results for input(s): "LIPASE", "AMYLASE" in the last 168 hours. No results for input(s): "AMMONIA" in the last 168 hours. Coagulation Profile: No results for input(s): "INR", "PROTIME" in the last 168 hours. Cardiac Enzymes: No results for input(s): "CKTOTAL", "CKMB", "CKMBINDEX", "TROPONINI" in the last 168 hours. BNP (last 3 results) No results for input(s): "PROBNP" in the last 8760 hours. HbA1C: No results for input(s): "HGBA1C" in the last 72 hours. CBG: No results for input(s): "GLUCAP" in the last 168 hours. Lipid Profile: No results for input(s): "CHOL", "HDL", "LDLCALC", "TRIG", "CHOLHDL", "LDLDIRECT" in the last 72 hours. Thyroid Function Tests: No results for input(s): "TSH", "T4TOTAL", "FREET4", "T3FREE", "THYROIDAB" in the last 72 hours. Anemia Panel: Recent Labs    05/04/23 0739  VITAMINB12 535  FOLATE 11.6  FERRITIN 160  TIBC 193*  IRON 54  RETICCTPCT 0.8   Sepsis Labs: Recent Labs  Lab 05/04/23 0739  PROCALCITON <0.10    No results found for this or any previous visit (from the past 240 hour(s)).       Radiology Studies: CT CHEST WO CONTRAST  Result Date: 05/04/2023 CLINICAL DATA:  Blunt chest trauma. EXAM: CT CHEST WITHOUT CONTRAST TECHNIQUE: Multidetector  CT imaging of the chest was performed following the standard protocol without IV contrast. RADIATION DOSE REDUCTION: This exam was performed according to the departmental dose-optimization program which includes automated exposure control, adjustment of the mA and/or kV according to patient size and/or use of iterative reconstruction technique. COMPARISON:  Radiographs 05/03/2023 FINDINGS: Cardiovascular: Normal heart size. Small low-density mild coronary artery and aortic atherosclerotic calcification. Pericardial effusion. Mediastinum/Nodes: Trachea and esophagus are unremarkable. No thoracic adenopathy. Lungs/Pleura: No pleural effusion or pneumothorax. Patchy ground-glass opacities in the posterior right upper lobe. Patchy nodular opacities in the superior segment left lower lobe. Bibasilar scarring. No pleural effusion or pneumothorax. Upper Abdomen: Cholelithiasis.  No acute abnormality. Musculoskeletal: No acute fracture. IMPRESSION: 1. Multifocal pneumonia. Follow-up in 6-8 weeks after treatment is recommended to ensure resolution. Aortic Atherosclerosis (ICD10-I70.0). Electronically Signed   By: Minerva Fester M.D.   On: 05/04/2023 02:15   DG Pelvis 1-2 Views  Result Date: 05/04/2023 CLINICAL DATA:  Fall, EXAM:  PELVIS - 1-2 VIEW COMPARISON:  None Available. FINDINGS: Normal alignment. No acute fracture or dislocation. Hip and sacroiliac joint spaces are preserved. Brachytherapy seeds are seen within the expected prostate gland. Soft tissues are otherwise unremarkable. IMPRESSION: 1. Negative. Electronically Signed   By: Helyn Numbers M.D.   On: 05/04/2023 00:04   DG Knee Complete 4 Views Left  Result Date: 05/04/2023 CLINICAL DATA:  Larey Seat in driveway.  Bilateral knee pain. EXAM: LEFT KNEE - COMPLETE 4+ VIEW; RIGHT KNEE - COMPLETE 4+ VIEW COMPARISON:  None Available. FINDINGS: No acute fracture or dislocation of either knee. Small left and trace right knee joint effusions. Bilateral patellar  enthesophytes. Mild medial compartment narrowing in the left knee. IMPRESSION: No acute fracture or dislocation. Electronically Signed   By: Minerva Fester M.D.   On: 05/04/2023 00:04   DG Knee Complete 4 Views Right  Result Date: 05/04/2023 CLINICAL DATA:  Larey Seat in driveway.  Bilateral knee pain. EXAM: LEFT KNEE - COMPLETE 4+ VIEW; RIGHT KNEE - COMPLETE 4+ VIEW COMPARISON:  None Available. FINDINGS: No acute fracture or dislocation of either knee. Small left and trace right knee joint effusions. Bilateral patellar enthesophytes. Mild medial compartment narrowing in the left knee. IMPRESSION: No acute fracture or dislocation. Electronically Signed   By: Minerva Fester M.D.   On: 05/04/2023 00:04   DG Chest 2 View  Result Date: 05/04/2023 CLINICAL DATA:  Larey Seat in driveway.  Mid chest pain. EXAM: CHEST - 2 VIEW COMPARISON:  Radiograph 12/01/2016 FINDINGS: Low lung volumes accentuate cardiomediastinal silhouette and pulmonary vascularity. Hazy opacities project over the midthoracic spine on lateral view without correlate on frontal view. No pleural effusion or pneumothorax. No displaced rib fractures. IMPRESSION: Hazy opacities project over the midthoracic spine on lateral view without frontal correlate. This could be projectional however pneumonia or contusion are not excluded. Consider chest CT for further evaluation. Electronically Signed   By: Minerva Fester M.D.   On: 05/04/2023 00:02   CT Head Wo Contrast  Result Date: 05/03/2023 CLINICAL DATA:  Head trauma, minor (Age >= 65y); Neck trauma (Age >= 65y), fall EXAM: CT HEAD WITHOUT CONTRAST CT CERVICAL SPINE WITHOUT CONTRAST TECHNIQUE: Multidetector CT imaging of the head and cervical spine was performed following the standard protocol without intravenous contrast. Multiplanar CT image reconstructions of the cervical spine were also generated. RADIATION DOSE REDUCTION: This exam was performed according to the departmental dose-optimization program  which includes automated exposure control, adjustment of the mA and/or kV according to patient size and/or use of iterative reconstruction technique. COMPARISON:  12/11/2016 FINDINGS: CT HEAD FINDINGS Brain: Similar prior examination, there is moderate ventriculomegaly, stable since prior examination, which appears asymmetric with the degree of parenchymal volume loss. 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 No evidence of acute intracranial hemorrhage or infarct. No abnormal mass effect or midline shift. No abnormal intra or extra-axial mass lesion. Ventricular size is normal. Vascular: No hyperdense vessel or unexpected calcification. Skull: Normal. Negative for fracture or focal lesion. Sinuses/Orbits: There is mucosal thickening within the visualized ethmoid air cells and maxillary sinuses. Orbits are unremarkable. Other: Mastoid air cells and middle ear cavities are clear. CT CERVICAL SPINE FINDINGS Alignment: Overall straightening of the cervical spine. No listhesis. Skull base and vertebrae: Craniocervical alignment is normal. The atlantodental interval is not widened. No acute fracture of the cervical spine vertebral body height is preserved. Soft tissues and spinal canal:  No prevertebral fluid or swelling. No visible canal hematoma. 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 Disc levels: There is intervertebral disc space narrowing and exuberant osteophyte formation in keeping with changes of advanced degenerative disc disease. Degenerative ankylosis of the C4 and C5 vertebral bodies. The prevertebral  soft tissues are not thickened on sagittal reformats. Multilevel uncovertebral arthrosis throughout the cervical spine results in multilevel severe neuroforaminal narrowing bilaterally. Upper chest: Negative. Other: None IMPRESSION: 1. No acute intracranial abnormality. No calvarial fracture. 2. No acute fracture or listhesis of the cervical spine. 3. 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 4. 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 Electronically Signed   By: Helyn Numbers M.D.   On: 05/03/2023 23:54   CT Cervical Spine Wo Contrast  Result Date: 05/03/2023 CLINICAL DATA:  Head trauma, minor (Age >= 65y); Neck trauma (Age >= 65y), fall EXAM: CT HEAD WITHOUT CONTRAST CT CERVICAL SPINE WITHOUT CONTRAST TECHNIQUE: Multidetector CT imaging of the head and cervical spine was performed following the standard protocol without intravenous contrast. Multiplanar CT image reconstructions of the cervical spine were also generated. RADIATION DOSE REDUCTION: This exam was performed according to the departmental dose-optimization program which includes automated exposure control, adjustment of the mA and/or kV according to patient size and/or use of iterative reconstruction technique. COMPARISON:  12/11/2016 FINDINGS: CT HEAD FINDINGS Brain: Similar prior examination, there is moderate ventriculomegaly, stable since prior examination, which appears asymmetric with the degree of parenchymal volume loss. 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 No evidence of acute intracranial hemorrhage or infarct. No abnormal mass effect or  midline shift. No abnormal intra or extra-axial mass lesion. Ventricular size is normal. Vascular: No hyperdense vessel or unexpected calcification. Skull: Normal. Negative for fracture or focal lesion. Sinuses/Orbits: There is mucosal thickening within the visualized ethmoid air cells and maxillary sinuses. Orbits are unremarkable. Other: Mastoid air cells and middle ear cavities are clear. CT CERVICAL SPINE FINDINGS Alignment: Overall straightening of the cervical spine. No listhesis. Skull base and vertebrae: Craniocervical alignment is normal. The atlantodental interval is not widened. No acute fracture of the cervical spine vertebral body height is preserved. Soft tissues and spinal canal: No prevertebral fluid or swelling. No visible canal hematoma. 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 Disc levels: There is intervertebral disc space narrowing and exuberant osteophyte formation in keeping with changes of advanced degenerative disc disease. Degenerative ankylosis of the C4 and C5 vertebral bodies. The prevertebral soft tissues are not thickened on sagittal reformats. Multilevel uncovertebral arthrosis throughout the cervical spine results in multilevel severe neuroforaminal narrowing bilaterally. Upper chest: Negative. Other: None IMPRESSION: 1. No acute intracranial  abnormality. No calvarial fracture. 2. No acute fracture or listhesis of the cervical spine. 3. 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 4. 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 Electronically Signed   By: Helyn Numbers M.D.   On: 05/03/2023 23:54    Scheduled Meds:  atorvastatin  10 mg Oral QHS   heparin  5,000 Units Subcutaneous Q8H   mirabegron ER  25 mg Oral Daily   tamsulosin  0.4 mg Oral QHS   Continuous Infusions:  azithromycin 500 mg (05/05/23 0005)   cefTRIAXone (ROCEPHIN)  IV 2 g (05/04/23 2218)     LOS: 1 day   Time spent:  Azucena Fallen, DO [facility]  If 7PM-7AM, please contact night-coverage [email or web address]  05/05/2023, 8:16 AM

## 2023-05-05 NOTE — TOC Initial Note (Signed)
Transition of Care [facility]) - Initial/Assessment Note    Patient Details  Name: Lance Todd MRN: [id number] Date of Birth: 19-Jun-1941  Transition of Care [facility]) CM/SW Contact:    Lance Sons, LCSW Phone Number: 05/05/2023, 11:52 AM  Clinical Narrative:                  CSW called pt's daughter, Lance Todd, to discuss HH recs. Daughter is agreeable to [facility] referral and would like a referral to the same agency he had in 2018 though she does not remember the name. She plans to identify the [facility] agency and provide CSW with preference once identified. Pt lives at home with his spouse. Daughter Lance Todd lives in [place]. Pt does not have any DME needs. Daughter also makes request to speak with attending; Attending notified.   Expected Discharge Plan: Home w Home Health Services Barriers to Discharge: Continued Medical Work up   Patient Goals and CMS Choice            Expected Discharge Plan and Services       Living arrangements for the past 2 months: Single Family Home                                      Prior Living Arrangements/Services Living arrangements for the past 2 months: Single Family Home Lives with:: Spouse Patient language and need for interpreter reviewed:: Yes        Need for Family Participation in Patient Care: Yes (Comment) Care giver support system in place?: Yes (comment)   Criminal Activity/Legal Involvement Pertinent to Current Situation/Hospitalization: No - Comment as needed  Activities of Daily Living Home Assistive Devices/Equipment: None ADL Screening (condition at time of admission) Patient's cognitive ability adequate to safely complete daily activities?: No Is the patient deaf or have difficulty hearing?: No Does the patient have difficulty seeing, even when wearing glasses/contacts?: No Does the patient have difficulty concentrating, remembering, or making decisions?: Yes Patient able to express need for assistance with ADLs?: Yes Does the  patient have difficulty dressing or bathing?: Yes Independently performs ADLs?: No Communication: Independent Dressing (OT): Needs assistance Is this a change from baseline?: Pre-admission baseline Grooming: Needs assistance Is this a change from baseline?: Pre-admission baseline Feeding: Independent Bathing: Needs assistance Is this a change from baseline?: Pre-admission baseline Toileting: Needs assistance Is this a change from baseline?: Pre-admission baseline Walks in Home: Needs assistance Is this a change from baseline?: Change from baseline, expected to last <3 days Does the patient have difficulty walking or climbing stairs?: Yes Weakness of Legs: Both Weakness of Arms/Hands: Both  Permission Sought/Granted                  Emotional Assessment         Alcohol / Substance Use: Not Applicable Psych Involvement: No (comment)  Admission diagnosis:  CAP (community acquired pneumonia) [J18.9] Multifocal pneumonia [J18.9] Patient Active Problem List   Diagnosis Date Noted   CAP (community acquired pneumonia) 05/04/2023   Acute metabolic encephalopathy 05/04/2023   Essential hypertension 05/04/2023   Unsteady gait 12/21/2016   Aseptic meningitis 12/08/2016   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   Acute renal failure superimposed on stage 3b chronic kidney disease (HCC) 12/08/2016   History of prostate cancer    Benign prostatic hyperplasia    PTSD (post-traumatic stress disorder)    Stage 3 chronic kidney disease (  HCC)    Acute blood loss anemia    Acute encephalopathy    Hypophosphatemia    Seizure (HCC) 12/01/2016   Hyperglycemia 12/01/2016   Depression 12/01/2016   Prostate cancer (HCC) 12/01/2016   HLD (hyperlipidemia) 12/01/2016   Insomnia 12/01/2016   PCP:  Lance Funk, MD (Inactive) Pharmacy:   [id number] [place], [place] - [address] AT [facility] OF [facility] & [address] [place]  [address] Phone: [phone] Fax: [phone]     Social Determinants of Health (SDOH) Social History: SDOH Screenings   Food Insecurity: No Food Insecurity (05/04/2023)  Housing: Low Risk  (05/04/2023)  Transportation Needs: No Transportation Needs (05/04/2023)  Utilities: Not At Risk (05/04/2023)  Tobacco Use: Low Risk  (05/03/2023)   SDOH Interventions:     Readmission Risk Interventions     No data to display

## 2023-05-05 NOTE — Progress Notes (Signed)
[name], patient's daughter called to check on patient. I informed her that the patient was standing up with this morning with IV out. Staff cleaned patient up and assisted patient back to bed. Daughter stated that this is not normal for him and wanted the attending physician to know. I informed her that this information would be passed along to the day shift nurse and that nurse will pass on the information to attending for the day.

## 2023-05-05 NOTE — Progress Notes (Signed)
Mobility Specialist - Progress Note   05/05/23 1424  Mobility  Activity Ambulated with assistance in hallway  Level of Assistance Standby assist, set-up cues, supervision of patient - no hands on  Assistive Device Front wheel walker  Distance Ambulated (ft) 265 ft  Activity Response Tolerated well  Mobility Referral Yes  $Mobility charge 1 Mobility  Mobility Specialist Start Time (ACUTE ONLY) 0214  Mobility Specialist Stop Time (ACUTE ONLY) 0223  Mobility Specialist Time Calculation (min) (ACUTE ONLY) 9 min   Pt received in recliner and agreeable to mobility. No complaints during session. Pt to recliner after session with all needs met. Chair alarm on.   [facility]

## 2023-05-06 DIAGNOSIS — I10 Essential (primary) hypertension: Secondary | ICD-10-CM | POA: Diagnosis not present

## 2023-05-06 DIAGNOSIS — G9341 Metabolic encephalopathy: Secondary | ICD-10-CM | POA: Diagnosis not present

## 2023-05-06 DIAGNOSIS — N179 Acute kidney failure, unspecified: Secondary | ICD-10-CM | POA: Diagnosis not present

## 2023-05-06 DIAGNOSIS — J189 Pneumonia, unspecified organism: Secondary | ICD-10-CM | POA: Diagnosis not present

## 2023-05-06 LAB — BASIC METABOLIC PANEL
Anion gap: 10 (ref 5–15)
BUN: 42 mg/dL — ABNORMAL HIGH (ref 8–23)
CO2: 20 mmol/L — ABNORMAL LOW (ref 22–32)
Calcium: 8.2 mg/dL — ABNORMAL LOW (ref 8.9–10.3)
Chloride: 109 mmol/L (ref 98–111)
Creatinine, Ser: 3.55 mg/dL — ABNORMAL HIGH (ref 0.61–1.24)
GFR, Estimated: 16 mL/min — ABNORMAL LOW (ref >60)
Glucose, Bld: 97 mg/dL (ref 70–99)
Potassium: 4.3 mmol/L (ref 3.5–5.1)
Sodium: 139 mmol/L (ref 135–145)

## 2023-05-06 LAB — CULTURE, BLOOD (ROUTINE X 2)
Culture: NO GROWTH
Special Requests: ADEQUATE

## 2023-05-06 NOTE — Discharge Summary (Signed)
Physician Discharge Summary  Lance Todd:[id number] DOB: 09-17-1941 DOA: 05/03/2023  PCP: [name], MD (Inactive)  Admit date: 05/03/2023 Discharge date: 05/06/2023  Admitted From: Home Disposition: Home  Recommendations for Outpatient Follow-up:  Follow up with PCP in 1-2 weeks  Home Health: PT Equipment/Devices: None  Discharge Condition: Stable CODE STATUS: Full Diet recommendation: Low-salt low-fat diet  Brief/Interim Summary: Lance Todd is a 82 y.o. male with medical history significant of  Prostate cancer, HLD,  OAB, CLBP depression, PTSD, HLD ,CKDIII-IV,with interim history of recent EGD/Cscope at [place] on 6/4 who presents to ED after a fall. Recently more confused after upper endoscopy at [facility]. Imaging at intake consistent with multifocal pneumonia.  Patient admitted with acute metabolic encephalopathy likely polypharmacy in nature due to recent procedure, multiple narcotic uses as well as home trazodone use.  Given patient's behavior just prior to intake consistent with sundowning versus delirium which improved drastically after cessation of offending medications.  Incidentally noted at intake patient was diagnosed with multifocal pneumonia without hypoxia symptoms leukocytosis or fever.  He did not meet sepsis criteria.  Given patient's asymptomatic nature curious if patient had previous pneumonia and imaging is simply showing previous infiltrate and edema that is now resolved.  Patient certainly could have aspirated during his mental status changes but again without symptoms low threshold to discontinue treatment.  Discussed with family, would recommend patient initiate previous doxycycline that had been prescribed to complete pneumonia treatment.  Close follow-up outpatient with PCP as scheduled.  Discontinue trazodone as discussed.  Discharge Diagnoses:  Principal Problem:   CAP (community acquired pneumonia) Active Problems:   Prostate cancer (HCC)   HLD  (hyperlipidemia)   PTSD (post-traumatic stress disorder)   Acute renal failure superimposed on stage 3b chronic kidney disease (HCC)   Acute metabolic encephalopathy   Essential hypertension  Sepsis secondary to multilobar CAP presumed bacterial  -Without hypoxia, tachypnea, leukocytosis, or fever, but notably with altered mental status - presumed etiology: pneumonia -Noted on imaging - consider aspiration given mental status as below   Acute metabolic encephalopathy  -multifactorial due to infection in setting of sedative medications during colonoscopy, home trazodone and complicated by ?aki vs worsening CKD   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, stable - Unknown baseline - last available labs are 2018 with creatinine around 2 - UOP appropriate - PO intake appropriate - [facility] ([facility] at baseline?)   Depression/PTSD   Prostate cancer   Anemia ,Normocytic -Likely anemia of chronic disease given CKD, iron panel unremarkable   HLD   Over active bladder   Chronic lumbago  Gastritis    Discharge Instructions   Allergies as of 05/06/2023   No Known Allergies      Medication List     STOP taking these medications    QUEtiapine 25 MG tablet Commonly known as: SEROQUEL   traZODone 50 MG tablet Commonly known as: DESYREL       TAKE these medications    atorvastatin 10 MG tablet Commonly known as: LIPITOR Take 10 mg by mouth at bedtime.   benzonatate 100 MG capsule Commonly known as: TESSALON Take 100 mg by mouth 3 (three) times daily as needed for cough.   calcitRIOL 0.25 MCG capsule Commonly known as: ROCALTROL Take 0.25 mcg by mouth daily.   CALCIUM-VITAMIN D PO Take 1 tablet by mouth daily.   carboxymethylcellulose 0.5 % Soln Commonly known as: REFRESH PLUS Place 1 drop into both eyes 4 (four) times daily  as needed (dry eyes).   cetirizine 10 MG tablet Commonly known as: ZYRTEC Take 10 mg by mouth daily as needed for allergies.    chlorpheniramine 4 MG tablet Commonly known as: CHLOR-TRIMETON Take 4 mg by mouth every 4 (four) hours as needed for allergies.   doxycycline 100 MG EC tablet Commonly known as: DORYX Take 100 mg by mouth 2 (two) times daily.   fluticasone 50 MCG/ACT nasal spray Commonly known as: FLONASE Place 2 sprays into both nostrils 4 (four) times daily as needed for allergies.   ketotifen 0.025 % ophthalmic solution Commonly known as: ZADITOR Place 1 drop into both eyes 2 (two) times daily.   lacosamide 50 MG Tabs tablet Commonly known as: VIMPAT Take 1 tablet (50 mg total) by mouth 2 (two) times daily.   losartan 25 MG tablet Commonly known as: COZAAR Take 25 mg by mouth daily.   mirabegron ER 25 MG Tb24 tablet Commonly known as: MYRBETRIQ Take 25 mg by mouth daily. STOP THIS MEDICATION WITH ALLERGIC SYMPTOMS OR BLOOD PRESSURE 150/100 OR HIGHER   pseudoephedrine 60 MG tablet Commonly known as: SUDAFED Take 60 mg by mouth every 6 (six) hours as needed for congestion.   tamsulosin 0.4 MG Caps capsule Commonly known as: FLOMAX Take 0.4 mg by mouth at bedtime.   vardenafil 20 MG tablet Commonly known as: LEVITRA Take 20 mg by mouth daily as needed for erectile dysfunction.   Vitamin D-3 25 MCG (1000 UT) Caps Take 1 capsule by mouth daily.        No Known Allergies  Consultations: None   Procedures/Studies: CT CHEST WO CONTRAST  Result Date: 05/04/2023 CLINICAL DATA:  Blunt chest trauma. EXAM: CT CHEST WITHOUT CONTRAST TECHNIQUE: Multidetector CT imaging of the chest was performed following the standard protocol without IV contrast. RADIATION DOSE REDUCTION: This exam was performed according to the departmental dose-optimization program which includes automated exposure control, adjustment of the mA and/or kV according to patient size and/or use of iterative reconstruction technique. COMPARISON:  Radiographs 05/03/2023 FINDINGS: Cardiovascular: Normal heart size. Small  low-density mild coronary artery and aortic atherosclerotic calcification. Pericardial effusion. Mediastinum/Nodes: Trachea and esophagus are unremarkable. No thoracic adenopathy. Lungs/Pleura: No pleural effusion or pneumothorax. Patchy ground-glass opacities in the posterior right upper lobe. Patchy nodular opacities in the superior segment left lower lobe. Bibasilar scarring. No pleural effusion or pneumothorax. Upper Abdomen: Cholelithiasis.  No acute abnormality. Musculoskeletal: No acute fracture. IMPRESSION: 1. Multifocal pneumonia. Follow-up in 6-8 weeks after treatment is recommended to ensure resolution. Aortic Atherosclerosis (ICD10-I70.0). Electronically Signed   By: Minerva Fester M.D.   On: 05/04/2023 02:15   DG Pelvis 1-2 Views  Result Date: 05/04/2023 CLINICAL DATA:  Fall, EXAM: PELVIS - 1-2 VIEW COMPARISON:  None Available. FINDINGS: Normal alignment. No acute fracture or dislocation. Hip and sacroiliac joint spaces are preserved. Brachytherapy seeds are seen within the expected prostate gland. Soft tissues are otherwise unremarkable. IMPRESSION: 1. Negative. Electronically Signed   By: Helyn Numbers M.D.   On: 05/04/2023 00:04   DG Knee Complete 4 Views Left  Result Date: 05/04/2023 CLINICAL DATA:  Larey Seat in driveway.  Bilateral knee pain. EXAM: LEFT KNEE - COMPLETE 4+ VIEW; RIGHT KNEE - COMPLETE 4+ VIEW COMPARISON:  None Available. FINDINGS: No acute fracture or dislocation of either knee. Small left and trace right knee joint effusions. Bilateral patellar enthesophytes. Mild medial compartment narrowing in the left knee. IMPRESSION: No acute fracture or dislocation. Electronically Signed   By: Minerva Fester  M.D.   On: 05/04/2023 00:04   DG Knee Complete 4 Views Right  Result Date: 05/04/2023 CLINICAL DATA:  Larey Seat in driveway.  Bilateral knee pain. EXAM: LEFT KNEE - COMPLETE 4+ VIEW; RIGHT KNEE - COMPLETE 4+ VIEW COMPARISON:  None Available. FINDINGS: No acute fracture or dislocation  of either knee. Small left and trace right knee joint effusions. Bilateral patellar enthesophytes. Mild medial compartment narrowing in the left knee. IMPRESSION: No acute fracture or dislocation. Electronically Signed   By: Minerva Fester M.D.   On: 05/04/2023 00:04   DG Chest 2 View  Result Date: 05/04/2023 CLINICAL DATA:  Larey Seat in driveway.  Mid chest pain. EXAM: CHEST - 2 VIEW COMPARISON:  Radiograph 12/01/2016 FINDINGS: Low lung volumes accentuate cardiomediastinal silhouette and pulmonary vascularity. Hazy opacities project over the midthoracic spine on lateral view without correlate on frontal view. No pleural effusion or pneumothorax. No displaced rib fractures. IMPRESSION: Hazy opacities project over the midthoracic spine on lateral view without frontal correlate. This could be projectional however pneumonia or contusion are not excluded. Consider chest CT for further evaluation. Electronically Signed   By: Minerva Fester M.D.   On: 05/04/2023 00:02   CT Head Wo Contrast  Result Date: 05/03/2023 CLINICAL DATA:  Head trauma, minor (Age >= 65y); Neck trauma (Age >= 65y), fall EXAM: CT HEAD WITHOUT CONTRAST CT CERVICAL SPINE WITHOUT CONTRAST TECHNIQUE: Multidetector CT imaging of the head and cervical spine was performed following the standard protocol without intravenous contrast. Multiplanar CT image reconstructions of the cervical spine were also generated. RADIATION DOSE REDUCTION: This exam was performed according to the departmental dose-optimization program which includes automated exposure control, adjustment of the mA and/or kV according to patient size and/or use of iterative reconstruction technique. COMPARISON:  12/11/2016 FINDINGS: CT HEAD FINDINGS Brain: Similar prior examination, there is moderate ventriculomegaly, stable since prior examination, which appears asymmetric with the degree of parenchymal volume loss. 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 No evidence of acute intracranial hemorrhage or infarct. No abnormal mass effect or midline shift. No abnormal intra or extra-axial mass lesion. Ventricular size is normal. Vascular: No hyperdense vessel or unexpected calcification. Skull: Normal. Negative for fracture or focal lesion. Sinuses/Orbits: There is mucosal thickening within the visualized ethmoid air cells and maxillary sinuses. Orbits are unremarkable. Other: Mastoid air cells and middle ear cavities are clear. CT CERVICAL SPINE FINDINGS Alignment: Overall straightening of the cervical spine. No listhesis. Skull base and vertebrae: Craniocervical alignment is normal. The atlantodental interval is not widened. No acute fracture of the cervical spine vertebral body height is preserved. Soft tissues and spinal canal: No prevertebral fluid or swelling. No visible canal hematoma. 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 Disc levels: There is intervertebral disc space narrowing and exuberant osteophyte formation in keeping with changes of advanced degenerative disc disease. Degenerative ankylosis of the C4 and C5 vertebral bodies. The prevertebral soft tissues are not thickened on sagittal reformats. Multilevel uncovertebral arthrosis throughout the cervical spine results in multilevel severe neuroforaminal narrowing bilaterally. Upper chest: Negative. Other: None IMPRESSION: 1. No acute intracranial abnormality. No calvarial fracture. 2. No acute fracture  or listhesis of the cervical spine. 3. 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 4. 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 Electronically Signed   By: Helyn Numbers M.D.   On: 05/03/2023 23:54   CT Cervical Spine Wo Contrast  Result Date: 05/03/2023 CLINICAL DATA:  Head trauma, minor (Age >= 65y); Neck trauma (Age >= 65y), fall EXAM: CT HEAD WITHOUT CONTRAST CT CERVICAL SPINE WITHOUT CONTRAST TECHNIQUE: Multidetector CT imaging of the head and cervical spine was performed following the standard protocol without intravenous contrast. Multiplanar CT image reconstructions of the cervical spine were also generated. RADIATION DOSE REDUCTION: This exam was performed according to the departmental dose-optimization program which includes automated exposure control, adjustment of the mA and/or kV according to patient size and/or use of iterative reconstruction technique. COMPARISON:  12/11/2016 FINDINGS: CT HEAD FINDINGS Brain: Similar prior examination, there is moderate ventriculomegaly, stable since prior examination, which appears asymmetric with the degree of parenchymal volume loss. 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 No evidence of acute intracranial hemorrhage or infarct. No abnormal mass effect or midline shift. No abnormal intra or extra-axial mass lesion. Ventricular size is normal. Vascular: No hyperdense vessel or unexpected calcification. Skull: Normal. Negative for fracture or focal lesion. Sinuses/Orbits: There is mucosal thickening within the visualized ethmoid air cells and maxillary sinuses. Orbits are unremarkable. Other: Mastoid air cells and middle ear cavities are clear. CT CERVICAL SPINE FINDINGS Alignment: Overall  straightening of the cervical spine. No listhesis. Skull base and vertebrae: Craniocervical alignment is normal. The atlantodental interval is not widened. No acute fracture of the cervical spine vertebral body height is preserved. Soft tissues and spinal canal: No prevertebral fluid or swelling. No visible canal hematoma. 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 Disc levels: There is intervertebral disc space narrowing and exuberant osteophyte formation in keeping with changes of advanced degenerative disc disease. Degenerative ankylosis of the C4 and C5 vertebral bodies. The prevertebral soft tissues are not thickened on sagittal reformats. Multilevel uncovertebral arthrosis throughout the cervical spine results in multilevel severe neuroforaminal narrowing bilaterally. Upper chest: Negative. Other: None IMPRESSION: 1. No acute intracranial abnormality. No calvarial fracture. 2. No acute fracture or listhesis of the cervical spine. 3. 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 4. 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 Electronically Signed   By: Helyn Numbers M.D.   On: 05/03/2023 23:54     Subjective: No acute issues or events overnight appears to be approaching baseline, denies nausea vomiting diarrhea constipation headache fevers chills or chest pain.   Discharge Exam: Vitals:   05/05/23 2051 05/06/23 0522   BP: (!) 150/70 (!) 142/67  Pulse: (!) 55 (!) 54  Resp: 18  18  Temp: 98.1 F (36.7 C) 98.3 F (36.8 C)  SpO2: 100% 99%   Vitals:   05/05/23 0521 05/05/23 1403 05/05/23 2051 05/06/23 0522  BP: (!) 155/64 127/61 (!) 150/70 (!) 142/67  Pulse: 64 70 (!) 55 (!) 54  Resp: 18 18 18 18   Temp: 98.2 F (36.8 C) 97.7 F (36.5 C) 98.1 F (36.7 C) 98.3 F (36.8 C)  TempSrc: Oral Oral Oral Oral  SpO2: 100% 100% 100% 99%  Height:        General: Pt is alert, awake, not in acute distress Cardiovascular: RRR, S1/S2 +, no rubs, no gallops Respiratory: CTA bilaterally, no wheezing, no rhonchi Abdominal: Soft, NT, ND, bowel sounds + Extremities: no edema, no cyanosis    The results of significant diagnostics from this hospitalization (including imaging, microbiology, ancillary and laboratory) are listed below for reference.     Microbiology: Recent Results (from the past 240 hour(s))  Blood culture (routine x 2)     Status: None (Preliminary result)   Collection Time: 05/04/23  3:11 AM   Specimen: BLOOD  Result Value Ref Range Status   Specimen Description   Final    BLOOD LEFT ANTECUBITAL Performed at [facility], [address] [place], [place] [address]    Special Requests   Final    BOTTLES DRAWN AEROBIC AND ANAEROBIC Blood Culture adequate volume Performed at [facility], [address] [place], [place] [address]    Culture   Final    NO GROWTH 2 DAYS Performed at [facility], [address], [place], [place] [address]    Report Status PENDING  Incomplete  Blood culture (routine x 2)     Status: None (Preliminary result)   Collection Time: 05/04/23  9:25 AM   Specimen: BLOOD  Result Value Ref Range Status   Specimen Description   Final    BLOOD BLOOD RIGHT ARM AEROBIC BOTTLE ONLY ANAEROBIC BOTTLE ONLY Performed at [facility], [address] [place], [place] [address]    Special Requests   Final     BOTTLES DRAWN AEROBIC AND ANAEROBIC Blood Culture adequate volume Performed at [facility], [address] [place], [place] [address]    Culture   Final    NO GROWTH 2 DAYS Performed at [facility], [address] [place], [place] [address]    Report Status PENDING  Incomplete     Labs: BNP (last 3 results) No results for input(s): "BNP" in the last 8760 hours. Basic Metabolic Panel: Recent Labs  Lab 05/03/23 2320 05/04/23 0739 05/05/23 0552 05/06/23 0611  NA 140 140 137 139  K 4.5 4.3 4.3 4.3  CL 111 112* 110 109  CO2 19* 19* 19* 20*  GLUCOSE 121* 100* 110* 97  BUN 46* 46* 43* 42*  CREATININE 4.05* 3.57* 3.29* 3.55*  CALCIUM 8.7* 8.2* 7.9* 8.2*   Liver Function Tests: Recent Labs  Lab 05/04/23 0739  AST 15  ALT 14  ALKPHOS 32*  BILITOT 0.7  PROT 6.3*  ALBUMIN 2.9*   No results for input(s): "LIPASE", "AMYLASE" in the last 168 hours. No results for input(s): "AMMONIA" in the last 168 hours. CBC: Recent Labs  Lab 05/03/23 2320 05/04/23 0739  WBC 7.0 6.3  NEUTROABS 5.2  --   HGB 9.3* 8.8*  HCT 26.7* 25.3*  MCV 82.4 82.1  PLT 197 184   Cardiac Enzymes: No results for input(s): "CKTOTAL", "CKMB", "CKMBINDEX", "  TROPONINI" in the last 168 hours. BNP: Invalid input(s): "POCBNP" CBG: No results for input(s): "GLUCAP" in the last 168 hours. D-Dimer No results for input(s): "DDIMER" in the last 72 hours. Hgb A1c No results for input(s): "HGBA1C" in the last 72 hours. Lipid Profile No results for input(s): "CHOL", "HDL", "LDLCALC", "TRIG", "CHOLHDL", "LDLDIRECT" in the last 72 hours. Thyroid function studies No results for input(s): "TSH", "T4TOTAL", "T3FREE", "THYROIDAB" in the last 72 hours.  Invalid input(s): "FREET3" Anemia work up Recent Labs    05/04/23 0739  VITAMINB12 535  FOLATE 11.6  FERRITIN 160  TIBC 193*  IRON 54  RETICCTPCT 0.8   Urinalysis    Component Value Date/Time   COLORURINE STRAW (A) 05/04/2023  0150   APPEARANCEUR CLEAR 05/04/2023 0150   LABSPEC 1.009 05/04/2023 0150   PHURINE 5.0 05/04/2023 0150   GLUCOSEU NEGATIVE 05/04/2023 0150   HGBUR SMALL (A) 05/04/2023 0150   BILIRUBINUR NEGATIVE 05/04/2023 0150   KETONESUR NEGATIVE 05/04/2023 0150   PROTEINUR NEGATIVE 05/04/2023 0150   NITRITE NEGATIVE 05/04/2023 0150   LEUKOCYTESUR NEGATIVE 05/04/2023 0150   Sepsis Labs Recent Labs  Lab 05/03/23 2320 05/04/23 0739  WBC 7.0 6.3   Microbiology Recent Results (from the past 240 hour(s))  Blood culture (routine x 2)     Status: None (Preliminary result)   Collection Time: 05/04/23  3:11 AM   Specimen: BLOOD  Result Value Ref Range Status   Specimen Description   Final    BLOOD LEFT ANTECUBITAL Performed at [facility], [address], [place], [place] [address]    Special Requests   Final    BOTTLES DRAWN AEROBIC AND ANAEROBIC Blood Culture adequate volume Performed at [facility], [address] [place], [place] [address]    Culture   Final    NO GROWTH 2 DAYS Performed at [facility], [address], [place], [place] [address]    Report Status PENDING  Incomplete  Blood culture (routine x 2)     Status: None (Preliminary result)   Collection Time: 05/04/23  9:25 AM   Specimen: BLOOD  Result Value Ref Range Status   Specimen Description   Final    BLOOD BLOOD RIGHT ARM AEROBIC BOTTLE ONLY ANAEROBIC BOTTLE ONLY Performed at [facility], [address], [place], [place] [address]    Special Requests   Final    BOTTLES DRAWN AEROBIC AND ANAEROBIC Blood Culture adequate volume Performed at [facility], [address] [place], [place] [address]    Culture   Final    NO GROWTH 2 DAYS Performed at [facility], [address], [place], [place] [address]    Report Status PENDING  Incomplete     Time coordinating discharge: Over 30 minutes  SIGNED:   Azucena Fallen, DO [facility] 05/06/2023, 2:25 PM Pager   If 7PM-7AM, please contact night-coverage [email or web address]

## 2023-05-06 NOTE — TOC Progression Note (Signed)
Transition of Care [facility]) - Progression Note    Patient Details  Name: Lance Todd MRN: [id number] Date of Birth: Mar 12, 1941  Transition of [facility] CM/SW Contact  Beckie Busing, RN Phone Number:[phone]  05/06/2023, 1:42 PM  Clinical Narrative:    CM at bedside to follow up on [facility] choice. Daughter [name] is on phone and states that she is still not sure of the name of agency previously used. CM will email daughter list.    Expected Discharge Plan: Home w Home Health Services Barriers to Discharge: Continued Medical Work up  Expected Discharge Plan and Services       Living arrangements for the past 2 months: Single Family Home                                       Social Determinants of Health (SDOH) Interventions SDOH Screenings   Food Insecurity: No Food Insecurity (05/04/2023)  Housing: Low Risk  (05/04/2023)  Transportation Needs: No Transportation Needs (05/04/2023)  Utilities: Not At Risk (05/04/2023)  Tobacco Use: Low Risk  (05/03/2023)    Readmission Risk Interventions     No data to display

## 2023-05-06 NOTE — Progress Notes (Signed)
Pt impulsively keeps trying to get up and will not use the walker.  Pt's ambulates but has very poor balance.  High Fall risk.  Bed and Chair alarms on.  Notified MD for possible PT re-evaluation.

## 2023-05-06 NOTE — Progress Notes (Signed)
Mobility Specialist - Progress Note   05/06/23 1103  Mobility  Activity Ambulated with assistance to bathroom  Level of Assistance Standby assist, set-up cues, supervision of patient - no hands on  Distance Ambulated (ft) 20 ft  Activity Response Tolerated well  Mobility Referral Yes  $Mobility charge 1 Mobility   Pt received in room standing with chair alarm going off. Pt requesting to use BR for BM. Assisted pt back to recliner once finished with all needs met. Chair alarm on & nurse in room.   [facility]

## 2023-05-06 NOTE — Progress Notes (Signed)
Spoke with Daughter, [name], regarding Discharge for patient. [name] would like to appeal discharge today due to lack of explanations on home medications continued at home with pt being in AKI.  [name] also is concerned that Home Health was not set up prior to discharge.  Wife says there has been no home health in the house in five years.   After clarifying communication with [name] and wife, patient does not have a walker, which he must have for ambulation.  Notified MD.  Notified Night Shift AC and Charge nurse.

## 2023-05-07 DIAGNOSIS — I10 Essential (primary) hypertension: Secondary | ICD-10-CM | POA: Diagnosis not present

## 2023-05-07 DIAGNOSIS — N179 Acute kidney failure, unspecified: Secondary | ICD-10-CM | POA: Diagnosis not present

## 2023-05-07 DIAGNOSIS — G9341 Metabolic encephalopathy: Secondary | ICD-10-CM | POA: Diagnosis not present

## 2023-05-07 DIAGNOSIS — J189 Pneumonia, unspecified organism: Secondary | ICD-10-CM | POA: Diagnosis not present

## 2023-05-07 LAB — CULTURE, BLOOD (ROUTINE X 2): Culture: NO GROWTH

## 2023-05-07 NOTE — Discharge Summary (Addendum)
Physician Discharge Summary  [name]:[id number] DOB: 12-Jun-1941 DOA: 05/03/2023  PCP: [name], MD (Inactive)  Admit date: 05/03/2023 Discharge date: 05/07/2023  Admitted From: Home Disposition: Home  Recommendations for Outpatient Follow-up:  Follow up with PCP in 1-2 weeks  Home Health: PT Equipment/Devices: Walker  Discharge Condition: Stable CODE STATUS: Full Diet recommendation: Low-salt low-fat diet  Brief/Interim Summary: Lance Todd is a 82 y.o. male with medical history significant of  Prostate cancer, HLD,  OAB, CLBP depression, PTSD, HLD ,CKDIII-IV,with interim history of recent EGD/Cscope at [place] on 6/4 who presents to ED after a fall. Recently more confused after upper endoscopy at [facility] at intake consistent with multifocal pneumonia.  Patient admitted with acute metabolic encephalopathy likely polypharmacy in nature due to recent procedure, multiple narcotic uses as well as home trazodone use.  Given patient's behavior just prior to intake consistent with sundowning versus delirium which improved drastically after cessation of offending medications.  Incidentally noted at intake patient was diagnosed with multifocal pneumonia without hypoxia symptoms leukocytosis or fever.  He did not meet sepsis criteria.  Given patient's asymptomatic nature curious if patient had previous pneumonia and imaging is simply showing previous infiltrate and edema that is now resolved.  Patient certainly could have aspirated during his mental status changes but again without symptoms low threshold to discontinue treatment.  Discussed with family, would recommend patient initiate previous doxycycline that had been prescribed to complete pneumonia treatment.  Close follow-up outpatient with PCP as scheduled.  Discontinue trazodone as discussed.  Patient remains medically stable for discharge, family appeal over concern for home health/durable medical equipment. There was  concern over creatinine elevation but creatinine is near baseline (3.1) per most recent [place] records.  Discharge Diagnoses:  Principal Problem:   CAP (community acquired pneumonia) Active Problems:   Prostate cancer (HCC)   HLD (hyperlipidemia)   PTSD (post-traumatic stress disorder)   Acute renal failure superimposed on stage 3b chronic kidney disease (HCC)   Acute metabolic encephalopathy   Essential hypertension  Sepsis secondary to multilobar CAP presumed bacterial  -Without hypoxia, tachypnea, leukocytosis, or fever, but notably with altered mental status - presumed etiology: pneumonia -Noted on imaging - consider aspiration given mental status as below   Acute metabolic encephalopathy  -multifactorial due to infection in setting of sedative medications during colonoscopy, home trazodone and complicated by ?aki vs worsening CKD   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, stable - Unknown baseline - last available labs are 2018 with creatinine around 2 - UOP appropriate - PO intake appropriate - Able to get results from [place] - most recent Creatinine 3.1. Essentially at his baseline at this point.   Depression/PTSD   Prostate cancer   Anemia ,Normocytic -Likely anemia of chronic disease given CKD, iron panel unremarkable   HLD   Over active bladder   Chronic lumbago  Gastritis    Discharge Instructions   Allergies as of 05/07/2023   No Known Allergies      Medication List     STOP taking these medications    QUEtiapine 25 MG tablet Commonly known as: SEROQUEL   traZODone 50 MG tablet Commonly known as: DESYREL       TAKE these medications    atorvastatin 10 MG tablet Commonly known as: LIPITOR Take 10 mg by mouth at bedtime.   benzonatate 100 MG capsule Commonly known as: TESSALON Take 100 mg by mouth 3 (three) times daily as needed for cough.   calcitRIOL 0.25 MCG capsule  Commonly known as: ROCALTROL Take 0.25 mcg by mouth daily.   CALCIUM-VITAMIN D PO Take 1  tablet by mouth daily.   carboxymethylcellulose 0.5 % Soln Commonly known as: REFRESH PLUS Place 1 drop into both eyes 4 (four) times daily as needed (dry eyes).   cetirizine 10 MG tablet Commonly known as: ZYRTEC Take 10 mg by mouth daily as needed for allergies.   chlorpheniramine 4 MG tablet Commonly known as: CHLOR-TRIMETON Take 4 mg by mouth every 4 (four) hours as needed for allergies.   doxycycline 100 MG EC tablet Commonly known as: DORYX Take 100 mg by mouth 2 (two) times daily.   fluticasone 50 MCG/ACT nasal spray Commonly known as: FLONASE Place 2 sprays into both nostrils 4 (four) times daily as needed for allergies.   ketotifen 0.025 % ophthalmic solution Commonly known as: ZADITOR Place 1 drop into both eyes 2 (two) times daily.   lacosamide 50 MG Tabs tablet Commonly known as: VIMPAT Take 1 tablet (50 mg total) by mouth 2 (two) times daily.   losartan 25 MG tablet Commonly known as: COZAAR Take 25 mg by mouth daily.   mirabegron ER 25 MG Tb24 tablet Commonly known as: MYRBETRIQ Take 25 mg by mouth daily. STOP THIS MEDICATION WITH ALLERGIC SYMPTOMS OR BLOOD PRESSURE 150/100 OR HIGHER   pseudoephedrine 60 MG tablet Commonly known as: SUDAFED Take 60 mg by mouth every 6 (six) hours as needed for congestion.   tamsulosin 0.4 MG Caps capsule Commonly known as: FLOMAX Take 0.4 mg by mouth at bedtime.   vardenafil 20 MG tablet Commonly known as: LEVITRA Take 20 mg by mouth daily as needed for erectile dysfunction.   Vitamin D-3 25 MCG (1000 UT) Caps Take 1 capsule by mouth daily.               Durable Medical Equipment  (From admission, onward)           Start     Ordered   05/06/23 1854  For home use only DME Walker  Once       Question:  Patient needs a walker to treat with the following condition  Answer:  Ambulatory dysfunction   05/06/23 1853            No Known Allergies  Consultations: None   Procedures/Studies: CT  CHEST WO CONTRAST  Result Date: 05/04/2023 CLINICAL DATA:  Blunt chest trauma. EXAM: CT CHEST WITHOUT CONTRAST TECHNIQUE: Multidetector CT imaging of the chest was performed following the standard protocol without IV contrast. RADIATION DOSE REDUCTION: This exam was performed according to the departmental dose-optimization program which includes automated exposure control, adjustment of the mA and/or kV according to patient size and/or use of iterative reconstruction technique. COMPARISON:  Radiographs 05/03/2023 FINDINGS: Cardiovascular: Normal heart size. Small low-density mild coronary artery and aortic atherosclerotic calcification. Pericardial effusion. Mediastinum/Nodes: Trachea and esophagus are unremarkable. No thoracic adenopathy. Lungs/Pleura: No pleural effusion or pneumothorax. Patchy ground-glass opacities in the posterior right upper lobe. Patchy nodular opacities in the superior segment left lower lobe. Bibasilar scarring. No pleural effusion or pneumothorax. Upper Abdomen: Cholelithiasis.  No acute abnormality. Musculoskeletal: No acute fracture. IMPRESSION: 1. Multifocal pneumonia. Follow-up in 6-8 weeks after treatment is recommended to ensure resolution. Aortic Atherosclerosis (ICD10-I70.0). Electronically Signed   By: Minerva Fester M.D.   On: 05/04/2023 02:15   DG Pelvis 1-2 Views  Result Date: 05/04/2023 CLINICAL DATA:  Fall, EXAM: PELVIS - 1-2 VIEW COMPARISON:  None Available. FINDINGS: Normal alignment. No  acute fracture or dislocation. Hip and sacroiliac joint spaces are preserved. Brachytherapy seeds are seen within the expected prostate gland. Soft tissues are otherwise unremarkable. IMPRESSION: 1. Negative. Electronically Signed   By: Helyn Numbers M.D.   On: 05/04/2023 00:04   DG Knee Complete 4 Views Left  Result Date: 05/04/2023 CLINICAL DATA:  Larey Seat in driveway.  Bilateral knee pain. EXAM: LEFT KNEE - COMPLETE 4+ VIEW; RIGHT KNEE - COMPLETE 4+ VIEW COMPARISON:  None  Available. FINDINGS: No acute fracture or dislocation of either knee. Small left and trace right knee joint effusions. Bilateral patellar enthesophytes. Mild medial compartment narrowing in the left knee. IMPRESSION: No acute fracture or dislocation. Electronically Signed   By: Minerva Fester M.D.   On: 05/04/2023 00:04   DG Knee Complete 4 Views Right  Result Date: 05/04/2023 CLINICAL DATA:  Larey Seat in driveway.  Bilateral knee pain. EXAM: LEFT KNEE - COMPLETE 4+ VIEW; RIGHT KNEE - COMPLETE 4+ VIEW COMPARISON:  None Available. FINDINGS: No acute fracture or dislocation of either knee. Small left and trace right knee joint effusions. Bilateral patellar enthesophytes. Mild medial compartment narrowing in the left knee. IMPRESSION: No acute fracture or dislocation. Electronically Signed   By: Minerva Fester M.D.   On: 05/04/2023 00:04   DG Chest 2 View  Result Date: 05/04/2023 CLINICAL DATA:  Larey Seat in driveway.  Mid chest pain. EXAM: CHEST - 2 VIEW COMPARISON:  Radiograph 12/01/2016 FINDINGS: Low lung volumes accentuate cardiomediastinal silhouette and pulmonary vascularity. Hazy opacities project over the midthoracic spine on lateral view without correlate on frontal view. No pleural effusion or pneumothorax. No displaced rib fractures. IMPRESSION: Hazy opacities project over the midthoracic spine on lateral view without frontal correlate. This could be projectional however pneumonia or contusion are not excluded. Consider chest CT for further evaluation. Electronically Signed   By: Minerva Fester M.D.   On: 05/04/2023 00:02   CT Head Wo Contrast  Result Date: 05/03/2023 CLINICAL DATA:  Head trauma, minor (Age >= 65y); Neck trauma (Age >= 65y), fall EXAM: CT HEAD WITHOUT CONTRAST CT CERVICAL SPINE WITHOUT CONTRAST TECHNIQUE: Multidetector CT imaging of the head and cervical spine was performed following the standard protocol without intravenous contrast. Multiplanar CT image reconstructions of the cervical  spine were also generated. RADIATION DOSE REDUCTION: This exam was performed according to the departmental dose-optimization program which includes automated exposure control, adjustment of the mA and/or kV according to patient size and/or use of iterative reconstruction technique. COMPARISON:  12/11/2016 FINDINGS: CT HEAD FINDINGS Brain: Similar prior examination, there is moderate ventriculomegaly, stable since prior examination, which appears asymmetric with the degree of parenchymal volume loss. 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 No evidence of acute intracranial hemorrhage or infarct. No abnormal mass effect or midline shift. No abnormal intra or extra-axial mass lesion. Ventricular size is normal. Vascular: No hyperdense vessel or unexpected calcification. Skull: Normal. Negative for fracture or focal lesion. Sinuses/Orbits: There is mucosal thickening within the visualized ethmoid air cells and maxillary sinuses. Orbits are unremarkable. Other: Mastoid air cells and middle ear cavities are clear. CT CERVICAL SPINE FINDINGS Alignment: Overall straightening of the cervical spine. No listhesis. Skull base and vertebrae: Craniocervical alignment is normal. The atlantodental interval is not widened. No acute fracture of the cervical spine vertebral body height is preserved. Soft tissues and spinal canal: No prevertebral fluid or swelling. No visible canal hematoma. 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 Disc levels: There is intervertebral disc space narrowing and exuberant osteophyte formation in  keeping with changes of advanced degenerative disc disease. Degenerative ankylosis of the C4 and C5 vertebral bodies. The prevertebral soft tissues are not thickened on sagittal reformats. Multilevel uncovertebral arthrosis throughout the cervical spine results in multilevel severe neuroforaminal narrowing bilaterally. Upper chest: Negative. Other: None IMPRESSION: 1. No acute intracranial abnormality. No calvarial fracture. 2. No acute fracture or listhesis of the cervical spine. 3. 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 4. 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 Electronically Signed   By: Helyn Numbers M.D.   On: 05/03/2023 23:54   CT Cervical Spine Wo Contrast  Result Date: 05/03/2023 CLINICAL DATA:  Head trauma, minor (Age >= 65y); Neck trauma (Age >= 65y), fall EXAM: CT HEAD WITHOUT CONTRAST CT CERVICAL SPINE WITHOUT CONTRAST TECHNIQUE: Multidetector CT imaging of the head and cervical spine was performed following the standard protocol without intravenous contrast. Multiplanar CT image reconstructions of the cervical spine were also generated. RADIATION DOSE REDUCTION: This exam was performed according to the departmental dose-optimization program which includes automated exposure control, adjustment of the mA and/or kV according to patient size and/or use of iterative reconstruction technique. COMPARISON:  12/11/2016 FINDINGS: CT HEAD FINDINGS Brain: Similar prior examination, there is moderate ventriculomegaly, stable since prior examination, which appears asymmetric with the degree of parenchymal volume loss. There is, additionally crowding of the sulci and gyri at the vertex and, together, the findings  suggest changes of normal pressure hydrocephalus. No evidence of acute intracranial hemorrhage or infarct. No abnormal mass effect or midline shift. No abnormal intra or extra-axial mass lesion. Ventricular size is normal. Vascular: No hyperdense vessel or unexpected calcification. Skull: Normal. Negative for fracture or focal lesion. Sinuses/Orbits: There is mucosal thickening within the visualized ethmoid air cells and maxillary sinuses. Orbits are unremarkable. Other: Mastoid air cells and middle ear cavities are clear. CT CERVICAL SPINE FINDINGS Alignment: Overall straightening of the cervical spine. No listhesis. Skull base and vertebrae: Craniocervical alignment is normal. The atlantodental interval is not widened. No acute fracture of the cervical spine vertebral body height is preserved. Soft tissues and spinal canal: No prevertebral fluid or swelling. No visible canal hematoma. 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 Disc levels: There is intervertebral disc space narrowing and exuberant osteophyte formation in keeping with changes of advanced degenerative disc disease. Degenerative ankylosis of the C4 and C5 vertebral bodies. The prevertebral soft tissues are not thickened on sagittal reformats. Multilevel uncovertebral arthrosis throughout the cervical spine results in multilevel severe neuroforaminal narrowing bilaterally. Upper chest: Negative. Other: None IMPRESSION: 1. No acute intracranial abnormality. No calvarial fracture. 2. No acute fracture or listhesis of the  cervical spine. 3. 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 4. 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 Electronically Signed   By: Helyn Numbers M.D.   On: 05/03/2023 23:54     Subjective: No acute issues or events overnight appears to be approaching baseline, denies nausea vomiting diarrhea constipation headache fevers chills or chest pain.   Discharge Exam: Vitals:   05/06/23 1431 05/07/23 0507  BP: 130/64 (!) 140/67  Pulse: 66 65  Resp:  18  Temp:  98 F (36.7 C)  SpO2: 100% 100%   Vitals:   05/05/23 2051 05/06/23 0522 05/06/23 1431 05/07/23 0507  BP: (!) 150/70 (!) 142/67 130/64 (!) 140/67  Pulse: (!) 55 (!) 54 66 65  Resp: 18 18  18   Temp: 98.1 F (36.7 C) 98.3 F (36.8 C)  98 F (36.7 C)  TempSrc: Oral Oral  Oral  SpO2: 100% 99% 100% 100%  Height:        General: Pt is alert, awake, not in acute distress Cardiovascular: RRR, S1/S2 +, no rubs, no gallops Respiratory: CTA bilaterally, no wheezing, no rhonchi Abdominal: Soft, NT, ND, bowel sounds + Extremities: no edema, no cyanosis    The results of significant diagnostics from this hospitalization (including imaging, microbiology, ancillary and laboratory) are listed below for reference.     Microbiology: Recent Results (from the past 240 hour(s))  Blood culture (routine x 2)     Status: None (Preliminary result)   Collection Time: 05/04/23  3:11 AM   Specimen: BLOOD  Result Value Ref Range Status   Specimen Description   Final    BLOOD LEFT ANTECUBITAL Performed at [facility], [address], [place], [place] [address]    Special Requests   Final    BOTTLES DRAWN AEROBIC AND ANAEROBIC Blood Culture adequate volume Performed at [facility], [address] [place], [place] [address]    Culture   Final     NO GROWTH 2 DAYS Performed at [facility], [address] [place], [place] [address]    Report Status PENDING  Incomplete  Blood culture (routine x 2)     Status: None (Preliminary result)   Collection Time: 05/04/23  9:25 AM   Specimen: BLOOD  Result Value Ref Range Status   Specimen Description   Final    BLOOD BLOOD RIGHT ARM AEROBIC BOTTLE ONLY ANAEROBIC BOTTLE ONLY Performed at [facility], [address] [place], [place] [address]    Special Requests   Final    BOTTLES DRAWN AEROBIC AND ANAEROBIC Blood Culture adequate volume Performed at [facility], [address], [place], [place] [address]    Culture   Final    NO GROWTH 2 DAYS Performed at [facility], [address] [place], [place] [address]    Report Status PENDING  Incomplete     Labs: BNP (last 3 results) No results for input(s): "BNP" in the last 8760 hours. Basic Metabolic Panel: Recent Labs  Lab 05/03/23 2320 05/04/23 0739 05/05/23 0552 05/06/23 0611  NA 140 140 137 139  K 4.5 4.3 4.3 4.3  CL 111 112* 110 109  CO2 19* 19* 19* 20*  GLUCOSE 121* 100* 110* 97  BUN  46* 46* 43* 42*  CREATININE 4.05* 3.57* 3.29* 3.55*  CALCIUM 8.7* 8.2* 7.9* 8.2*    Liver Function Tests: Recent Labs  Lab 05/04/23 0739  AST 15  ALT 14  ALKPHOS 32*  BILITOT 0.7  PROT 6.3*  ALBUMIN 2.9*    No results for input(s): "LIPASE", "AMYLASE" in the last 168 hours. No results for input(s): "AMMONIA" in the last 168 hours. CBC: Recent Labs  Lab 05/03/23 2320 05/04/23 0739  WBC 7.0 6.3  NEUTROABS 5.2  --   HGB 9.3* 8.8*  HCT 26.7* 25.3*  MCV 82.4 82.1  PLT 197 184    Cardiac Enzymes: No results for input(s): "CKTOTAL", "CKMB", "CKMBINDEX", "TROPONINI" in the last 168 hours. BNP: Invalid input(s): "POCBNP" CBG: No results for input(s): "GLUCAP" in the last 168 hours. D-Dimer No results for input(s): "DDIMER" in the last 72 hours. Hgb A1c No results  for input(s): "HGBA1C" in the last 72 hours. Lipid Profile No results for input(s): "CHOL", "HDL", "LDLCALC", "TRIG", "CHOLHDL", "LDLDIRECT" in the last 72 hours. Thyroid function studies No results for input(s): "TSH", "T4TOTAL", "T3FREE", "THYROIDAB" in the last 72 hours.  Invalid input(s): "FREET3" Anemia work up No results for input(s): "VITAMINB12", "FOLATE", "FERRITIN", "TIBC", "IRON", "RETICCTPCT" in the last 72 hours.  Urinalysis    Component Value Date/Time   COLORURINE STRAW (A) 05/04/2023 0150   APPEARANCEUR CLEAR 05/04/2023 0150   LABSPEC 1.009 05/04/2023 0150   PHURINE 5.0 05/04/2023 0150   GLUCOSEU NEGATIVE 05/04/2023 0150   HGBUR SMALL (A) 05/04/2023 0150   BILIRUBINUR NEGATIVE 05/04/2023 0150   KETONESUR NEGATIVE 05/04/2023 0150   PROTEINUR NEGATIVE 05/04/2023 0150   NITRITE NEGATIVE 05/04/2023 0150   LEUKOCYTESUR NEGATIVE 05/04/2023 0150   Sepsis Labs Recent Labs  Lab 05/03/23 2320 05/04/23 0739  WBC 7.0 6.3    Microbiology Recent Results (from the past 240 hour(s))  Blood culture (routine x 2)     Status: None (Preliminary result)   Collection Time: 05/04/23  3:11 AM   Specimen: BLOOD  Result Value Ref Range Status   Specimen Description   Final    BLOOD LEFT ANTECUBITAL Performed at [facility], [address], [place], [place] [address]    Special Requests   Final    BOTTLES DRAWN AEROBIC AND ANAEROBIC Blood Culture adequate volume Performed at [facility], [address], [place], [place] [address]    Culture   Final    NO GROWTH 2 DAYS Performed at [facility], [address], [place], [place] [address]    Report Status PENDING  Incomplete  Blood culture (routine x 2)     Status: None (Preliminary result)   Collection Time: 05/04/23  9:25 AM   Specimen: BLOOD  Result Value Ref Range Status   Specimen Description   Final    BLOOD BLOOD RIGHT ARM AEROBIC BOTTLE ONLY ANAEROBIC BOTTLE  ONLY Performed at [facility], [address] [place], [place] [address]    Special Requests   Final    BOTTLES DRAWN AEROBIC AND ANAEROBIC Blood Culture adequate volume Performed at [facility], [address], [place], [place] [address]    Culture   Final    NO GROWTH 2 DAYS Performed at [facility], [address], [place], [place] [address]    Report Status PENDING  Incomplete     Time coordinating discharge: Over 30 minutes  SIGNED:   Azucena Fallen, DO [facility]  05/07/2023, 8:04 AM Pager   If 7PM-7AM, please contact night-coverage [email or web address]

## 2023-05-07 NOTE — Progress Notes (Signed)
Mobility Specialist - Progress Note   05/07/23 1352  Mobility  Activity Ambulated with assistance in hallway  Level of Assistance Modified independent, requires aide device or extra time  Assistive Device Front wheel walker  Distance Ambulated (ft) 500 ft  Activity Response Tolerated well  Mobility Referral Yes  $Mobility charge 1 Mobility  Mobility Specialist Start Time (ACUTE ONLY) 0140  Mobility Specialist Stop Time (ACUTE ONLY) 0146  Mobility Specialist Time Calculation (min) (ACUTE ONLY) 6 min   Pt received in hallway with OT and agreeable to mobility. No complaints during session. Pt to recliner after session with all needs met. Chair alarm on.   [facility]

## 2023-05-07 NOTE — Progress Notes (Signed)
Physical Therapy Treatment Patient Details Name: Lance Todd MRN: [id number] DOB: May 26, 1941 Today's Date: 05/07/2023   History of Present Illness Pt is an 82 y/o M admitted on 05/03/23 after presenting to the hospital with c/o a fall, confusion, cough & congestion. CT chest showed multifocal PNA. PMH: prostate CA, HLD, OAB, CLBP depression, PTSD, HLD, CKD 3-4    PT Comments     Pt admitted with above diagnosis.  Pt currently with functional limitations due to the deficits listed below (see PT Problem List). PT arrived and nursing staff in room, daughter, [name] on phone expressing concerns per pt anticipated d/c home. Per daughter pt has demonstrated a functional decline following colonoscopy and sustained a fall at home and PLOF pt was IND with all ambulation without AD, S or A. PT made aware of need for documentation/recommendation for RW in home setting. PT able to assess pt gait pattern, independence, safety and stability with and without AD, pt will benefit from RW in home setting to improve safety, stability and energy conservation. Pt was able to demonstrate ability to amb 500 feet with mobility specialist earlier in the day with use of RW. This afternoon pt required S for cues for turns, side stepping to the L and R and obstacle navigation with RW in hallway, min guard for gait tasks without AD. Pt required min guard and cues for step navigation x 10 with R handrail. S for safety and technique with RW and UE placement for transfer tasks. PT communicated with daughter per concerns and this therapist was able to speak from prior experience as a HHPT, however unable to speak for current agency or individual therapist whom will be completing evaluation with daughter requesting a focus on home assessment. Pt left seated in recliner, PT able to adjust personal RW to a comfortable hight for pt and all needs in place. Pt will benefit from acute skilled PT in current and next venue to increase their  independence and safety with mobility to allow discharge.      Recommendations for follow up therapy are one component of a multi-disciplinary discharge planning process, led by the attending physician.  Recommendations may be updated based on patient status, additional functional criteria and insurance authorization.  Follow Up Recommendations       Assistance Recommended at Discharge Intermittent Supervision/Assistance  Patient can return home with the following A little help with walking and/or transfers;A little help with bathing/dressing/bathroom;Assistance with cooking/housework;Assist for transportation;Help with stairs or ramp for entrance   Equipment Recommendations  Rolling walker (2 wheels) (pt will benefit from RW for fall risk prevention with recent fall fx)    Recommendations for Other Services       Precautions / Restrictions Precautions Precautions: Fall Restrictions Weight Bearing Restrictions: No     Mobility  Bed Mobility                    Transfers Overall transfer level: Needs assistance Equipment used: Rolling walker (2 wheels) Transfers: Sit to/from Stand Sit to Stand: Supervision           General transfer comment: cues for proper UE placement    Ambulation/Gait Ambulation/Gait assistance: Supervision Gait Distance (Feet): 50 Feet Assistive device: Rolling walker (2 wheels), None Gait Pattern/deviations: Decreased stride length Gait velocity: decreased     General Gait Details: PT recomends RW for safety and stability in home setting. PT assessed gait pattern with RW and pt demonstrated step almost through pattern with good B  LE clearnace and continous anterior movement and no evidnce of instability, PT assessed pt gait without AD ( 20 feet with min guard) noted deterioration with gait pattern with decreased almost step to pattern wide BOS, lateral sway and slight instability with turns, PT able to educate and have pt perform return  demonstration with cues side stepping to the L and to the R with use of RW and close S due to daughter reports of narrow bathroom doors and apparent obstacles in home   Stairs Stairs: Yes Stairs assistance: Min guard Stair Management: One rail Right Number of Stairs: 10 General stair comments: pt required min guard and cues for proper foot placement with pt encouraged to perform step to pattern when decending steps, pt able to perform reciprocal pattern when ascending steps with min guard and R UE support at handrail with cues for proper foot placement   Wheelchair Mobility    Modified Rankin (Stroke Patients Only)       Balance Overall balance assessment: Needs assistance   Sitting balance-Leahy Scale: Good     Standing balance support: During functional activity, No upper extremity supported Standing balance-Leahy Scale: Fair Standing balance comment: pt is able to maintain static standing balance without UE support and no evidince of LOB, pt requires B UE support for dynamic balance challenges and gait tasks                            Cognition Arousal/Alertness: Awake/alert Behavior During Therapy: WFL for tasks assessed/performed Overall Cognitive Status: Within Functional Limits for tasks assessed Area of Impairment: Safety/judgement, Awareness                         Safety/Judgement: Decreased awareness of safety, Decreased awareness of deficits   Problem Solving: Slow processing General Comments: A and O x 4 able to follow direction with increased time and cues        Exercises      General Comments General comments (skin integrity, edema, etc.): Daughter in communication via phone with Nusing supervisor Dawn present and nurse Ingred      Pertinent Vitals/Pain      Home Living                          Prior Function            PT Goals (current goals can now be found in the care plan section) Acute Rehab PT  Goals Patient Stated Goal: get better PT Goal Formulation: With patient Time For Goal Achievement: 05/18/23 Potential to Achieve Goals: Good Progress towards PT goals: Progressing toward goals    Frequency    Min 3X/week      PT Plan      Co-evaluation              AM-PAC PT "6 Clicks" Mobility   Outcome Measure  Help needed turning from your back to your side while in a flat bed without using bedrails?: None Help needed moving from lying on your back to sitting on the side of a flat bed without using bedrails?: None Help needed moving to and from a bed to a chair (including a wheelchair)?: A Little Help needed standing up from a chair using your arms (e.g., wheelchair or bedside chair)?: A Little Help needed to walk in hospital room?: A Little Help needed climbing 3-5 steps with a  railing? : A Little 6 Click Score: 20    End of Session Equipment Utilized During Treatment: Gait belt Activity Tolerance: Patient tolerated treatment well Patient left: in chair;with chair alarm set;with call bell/phone within reach Nurse Communication: Mobility status PT Visit Diagnosis: Unsteadiness on feet (R26.81);Muscle weakness (generalized) (M62.81);Difficulty in walking, not elsewhere classified (R26.2)     Time: 1610-9604 PT Time Calculation (min) (ACUTE ONLY): 34 min  Charges:  $Gait Training: 8-22 mins $Neuromuscular Re-education: 8-22 mins                     Johnny Bridge, PT Acute Rehab    Jacqualyn Posey 05/07/2023, 5:25 PM

## 2023-05-07 NOTE — Progress Notes (Signed)
Called to room, spoke with patient and daughter Lance Todd concerning plan of care at discharge.  Patient with no concerns requesting to discharge home, daughter has concerns with patient discharge including home health pre home assessment and shower chair.  PT in to see patient while on the phone with daughter.  Assessed and reviewed proper use of walker, stairs, and side step with walker.  Please see PT note.  Shower chair ordered to be delivered to patient home by [employer]  Discussed the above with patient and Lance Todd. Reviewed information for home health agency including contact number should any questions or concerns arise upon discharge.  Cousin and wife to transport patient home. Patient expressed no concerns and was ready for discharge

## 2023-05-07 NOTE — TOC Transition Note (Signed)
Transition of Care [facility]) - CM/SW Discharge Note   Patient Details  Name: Lance Todd MRN: [id number] Date of Birth: [birth year]  [facility] CM/SW Contact:  Lavenia Atlas, RN Phone Number: 05/07/2023, 6:42 PM   Clinical Narrative:   This RNCM was contacted by [facility] regarding assistance with obtaining a shower chair for patient being discharged today. This RNCM notified Joni Reining w/ [employer] After hours who request email or fax copy of DME orders to the after hours department. This RN CM faxed and emailed a copy of orders, discharge summary, PT notes.  [facility] to determine [facility] contact info. Spoke with Drue Stager who reports he does not have info however [facility] SW should be able to assist. This RNCM called [phone] ext [phone] and ext [phone] for assistance, left voicemail message.   No additional TOC needs at this time.     Final next level of care: Home w Home Health Services Barriers to Discharge: Continued Medical Work up   Patient Goals and CMS Choice      Discharge Placement                         Discharge Plan and Services Additional resources added to the After Visit Summary for                                       Social Determinants of Health (SDOH) Interventions SDOH Screenings   Food Insecurity: No Food Insecurity (05/04/2023)  Housing: Low Risk  (05/04/2023)  Transportation Needs: No Transportation Needs (05/04/2023)  Utilities: Not At Risk (05/04/2023)  Tobacco Use: Low Risk  (05/03/2023)     Readmission Risk Interventions     No data to display

## 2023-05-07 NOTE — TOC Progression Note (Addendum)
Transition of Care [facility]) - Progression Note    Patient Details  Name: Lance Todd MRN: [id number] Date of Birth: 11-Nov-1941  [facility]) CM/SW Contact  Beckie Busing, RN Phone Number:[phone]  05/07/2023, 11:02 AM  Clinical Narrative:    CM called daughter [name] to follow up on home health choice. Daughter referred CM to email that was sent last night. CM has informed daughter that communications should be via pone or in person  and that CM does not communicate patient information via email. CM has informed daughter that she is able to call CM for a timely response. Daughter states that her mother was here to pick up patient for discharge on yesterday evening and was told by the bedside nurse [facility] that patient will definitely need walker to ambulate at home because he is a fall risk. CM made daughter aware that DME walker has not been ordered due to no recommendations in therapy note. Daughter tells CM that CM should refer to email because everything is outlined in the email. Daughter also states that she was told by the nurse that she can appeal her discharge. CM made daughter aware that there is a proper appeals process which CM will provide daughter with info for appeal.   CM has reached out to therapy for updated recommendation. Insurance will not pay for DME without therapy recommendations. Will await updated note.   CM has attached email from daughter.  Tyrone Nine,    [place] my.Marland KitchenMarland KitchenMarland KitchenI just discovered the following email, and it is from you.  Uncertain whether you are aware that the email delivers absent your name and/or [place].  It arrived as follows:  "Scan from [employer]"   I phoned my Dad (Mr. Lance Todd, room 16-18E) shortly after 2pmET today and held as he rang the [employer] and requested the Nurses Station contact you since I had not received the Home Health information, and for you to contact me regarding.  This occurred after I received the  first email that did not contain an attachment or list of Home Health Agencies as we discussed this morning in Dad's room.  (Of course, the omission can unintentionally occur.)   I am glad that you stated you like for the patient to discharge with home health in order, prearranged, and everything the patient requires to reintegrate into the home.  As you observed today, Dad is super excited to get home, as we are, and  assuming Dr. Natale Milch thinks Dad is medically stable.  Uncertain whether you are aware, since I was not.  Later today, I  learned that  Dad has been ambulating with a Walker during his successful corridor walks.  The conversation revealed that Dad would require a walker at home and there was not a Physical Therapy session scheduled this afternoon, after all.  (You might remember I asked when PT might see him this afternoon since I was told Dad was scheduled.)     Herewith, Dad has never used a walker at home and seems a home assessment is required.  You have two 52 year olds that should not attempt to relocate furniture and/or rugs. (Incidentally, Dad has been/is mom's caregiver.)    Not to mention, they live in an older home and uncertain whether the walker can clear doorways, the walk-in shower, and in particular the bathrooms and bedroom in the traditional manner.    With the above in mind, are the list of companies that you  provided able to complete a home safety assessment and provide equipment (such as a walker) and/or a ramp (over the brick steps) to enter the home, or whatever the experts might deem appropriate?    Please advise and sincere thanks in advance.  P.S. Might you have a list of medications indicated by Dr. Carma Leaven that my Dad should take upon discharge?  To my knowledge, Dr. Natale Milch did not return to my Dad's room and nor did I receive a call from him.    Very Kind Regards,  Johny Drilling [phone]  (Daughter, Lance Todd  1130 CM has  reached out to several home health agencies about daughter request to have someone to do a safety assessment prior to patients discharge. Home health agencies are not allowed to complete home assessment until the patient is in the home. Medicare guidelines will not allow for assessment until the patient is in the home.  1158 Daughter [name] has chosen [facility] for home health care services. HH referral has been called to Cindie with [facility].Daughgter [name] has been updated on this referral. AVS has been updated.  1215 CM has reached out to [facility] supervisor for guidance on appeal process. The patient can not appeal if the primary insurance is not medicare. Primary insurance for this patient is [place]. So the patient / family will not be able to appeal through insurance. Any concerns about medical reason for appeal should be referred to the physician directing care for this patient.      Expected Discharge Plan: Home w Home Health Services Barriers to Discharge: Continued Medical Work up  Expected Discharge Plan and Services       Living arrangements for the past 2 months: Single Family Home Expected Discharge Date: 05/06/23                                     Social Determinants of Health (SDOH) Interventions SDOH Screenings   Food Insecurity: No Food Insecurity (05/04/2023)  Housing: Low Risk  (05/04/2023)  Transportation Needs: No Transportation Needs (05/04/2023)  Utilities: Not At Risk (05/04/2023)  Tobacco Use: Low Risk  (05/03/2023)    Readmission Risk Interventions     No data to display

## 2023-05-07 NOTE — Progress Notes (Addendum)
Patient's daughter is now calling from [place].  She is upset that she wasn't told about patient being discharged today. States not aware of discharge. Not aware of physical therapy.  States not aware that he needed a walker.  She states that she wants to "appeal" the discharge.   AC notified and she will come to the patient's room.  Addendum at 23:  Patient's daughter now agrees to discharge after another phone call to on-call MD to ask antibiotic question. Phone number for Home Care Team is printed on discharge paper. Patient brought to car in wheelchair.

## 2023-05-07 NOTE — Progress Notes (Signed)
Occupational Therapy Treatment Patient Details Name: Lance Todd MRN: [id number] DOB: Apr 21, 1941 Today's Date: 05/07/2023   History of present illness Pt is an 82 y/o M admitted on 05/03/23 after presenting to the hospital with c/o a fall, confusion, cough & congestion. CT chest showed multifocal PNA. PMH: prostate CA, HLD, OAB, CLBP depression, PTSD, HLD, CKD 3-4   OT comments  Pt has demonstrated gradual functional progress during his hospital stay. He has subsequently met his OT goals. He was able to perform all implemented tasks without the need for assistance, including functional transfers and ambulation. OT reviewed general home safety recommendations, to ensure his optimal ADL participation and performance in the home; he presented with good understanding. He does not require further OT services, therefore OT will sign off and recommend he return home at discharge.    Recommendations for follow up therapy are one component of a multi-disciplinary discharge planning process, led by the attending physician.  Recommendations may be updated based on patient status, additional functional criteria and insurance authorization.    Assistance Recommended at Discharge PRN  Patient can return home with the following  Help with stairs or ramp for entrance;Assistance with cooking/housework         Precautions / Restrictions Restrictions Weight Bearing Restrictions: No       Mobility Bed Mobility    General bed mobility comments: pt was received seated in the bedside chair    Transfers Overall transfer level: Needs assistance   Transfers: Sit to/from Stand Sit to Stand: Independent                     ADL either performed or assessed with clinical judgement   ADL Overall ADL's : Independent;At baseline                 Cognition Arousal/Alertness: Awake/alert Behavior During Therapy: WFL for tasks assessed/performed Overall Cognitive Status: Within Functional  Limits for tasks assessed                                                     Pertinent Vitals/ Pain       Pain Assessment Pain Assessment: No/denies pain         Frequency  Min 1X/week        Progress Toward Goals  OT Goals(current goals can now be found in the care plan section)  Progress towards OT goals: Goals met/education completed, patient discharged from OT     Plan Discharge plan remains appropriate       AM-PAC OT "6 Clicks" Daily Activity     Outcome Measure   Help from another person eating meals?: None Help from another person taking care of personal grooming?: None Help from another person toileting, which includes using toliet, bedpan, or urinal?: None Help from another person bathing (including washing, rinsing, drying)?: None Help from another person to put on and taking off regular upper body clothing?: None Help from another person to put on and taking off regular lower body clothing?: None 6 Click Score: 24    End of Session Equipment Utilized During Treatment: Rolling walker (2 wheels)  OT Visit Diagnosis: Muscle weakness (generalized) (M62.81)   Activity Tolerance Patient tolerated treatment well   Patient Left Other (comment) (standing with mobility tech)       Time: 4098-1191  OT Time Calculation (min): 10 min  Charges: OT General Charges $OT Visit: 1 Visit OT Treatments $Therapeutic Activity: 8-22 mins     Reuben Likes, OTR/L  05/07/2023, 2:46 PM

## 2023-05-08 LAB — CULTURE, BLOOD (ROUTINE X 2): Special Requests: ADEQUATE

## 2023-05-09 LAB — CULTURE, BLOOD (ROUTINE X 2)

## 2023-07-21 ENCOUNTER — Ambulatory Visit: Payer: Medicare PPO | Admitting: Physical Therapy

## 2023-08-27 ENCOUNTER — Ambulatory Visit: Payer: Medicare PPO | Admitting: Physical Therapy

## 2023-09-02 NOTE — Therapy (Signed)
OUTPATIENT PHYSICAL THERAPY LOWER EXTREMITY EVALUATION   Patient Name: Lance Todd MRN: [id number] DOB:29-Jun-1941, 82 y.o., male Today's Date: 09/02/2023  END OF SESSION:   Past Medical History:  Diagnosis Date   Cancer determined by prostate biopsy (HCC)    Depression    Hypercholesterolemia    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   PTSD (post-traumatic stress disorder)    Past Surgical History:  Procedure Laterality Date   PROSTATE SURGERY     Patient Active Problem List   Diagnosis Date Noted   CAP (community acquired pneumonia) 05/04/2023   Acute metabolic encephalopathy 05/04/2023   Essential hypertension 05/04/2023   Unsteady gait 12/21/2016   Aseptic meningitis 12/08/2016   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   Acute renal failure superimposed on stage 3b chronic kidney disease (HCC) 12/08/2016   History of prostate cancer    Benign prostatic hyperplasia    PTSD (post-traumatic stress disorder)    Stage 3 chronic kidney disease (HCC)    Acute blood loss anemia    Acute encephalopathy    Hypophosphatemia    Seizure (HCC) 12/01/2016   Hyperglycemia 12/01/2016   Depression 12/01/2016   Prostate cancer (HCC) 12/01/2016   HLD (hyperlipidemia) 12/01/2016   Insomnia 12/01/2016    PCP: [name], MD  REFERRING PROVIDER: [name], MD   REFERRING DIAG: R53.1 (ICD-10-CM) [name]   THERAPY DIAG:  No diagnosis found.  Rationale for Evaluation and Treatment: Rehabilitation  ONSET DATE: 07/14/23  SUBJECTIVE:   SUBJECTIVE STATEMENT: Patient reports LBP, goes into L hip and leg.   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 Yes: NPRS scale: 10/10 Pain location: low  back and hip Pain description: aching Aggravating factors: not every day. Relieving factors: moving around  PRECAUTIONS: Fall  RED FLAGS: None   WEIGHT BEARING RESTRICTIONS: No  FALLS:  Has patient fallen in last 6 months? Yes. Number of falls 1,slipped while going out to his mailbox  LIVING ENVIRONMENT: Lives with: lives with their family Lives in: House/apartment Stairs: Yes: External: 2 steps; none Has following equipment at home: None  OCCUPATION: N/A, Patient reports he is very sedentary at home.  PLOF: Independent  PATIENT GOALS: Decrease pain  NEXT MD VISIT: unknown  OBJECTIVE:  Note: Objective measures were completed at Evaluation unless otherwise noted.  DIAGNOSTIC FINDINGS: X rays at the time of his fall were all negative for fractures in pelvis, hips, knees  COGNITION: Overall cognitive status: Within functional limits for tasks assessed     SENSATION: Patient reports inconsistent tingling in toes and also in L hip  EDEMA:  denies  MUSCLE LENGTH: Hamstrings: Right 70 deg; Left 55 deg Thomas test: Right WFL deg; Left WFL deg  POSTURE: No Significant postural limitations  PALPATION: Mild and intermittent TTP along L L4-L5 paraspinals  LUMBAR ROM: flex to mid shin, ext WNL, lateral side bend to mid thigh B, rotation to R 50%, L WNL  LOWER EXTREMITY ROM: R WNL, L hip generally mildly limited  LOWER EXTREMITY MMT: BLE 5/5 throughout, but demonstrates poor motor coordination and control, weak trunk  LUMBAR SPECIAL TESTS; slr AND LUMBAR COMPRESSION NEGATIVE.  LOWER EXTREMITY SPECIAL TESTS:  Hip special tests: Hip scouring test: negative  FUNCTIONAL  TESTS:  5 times sit to stand: 15.13 Timed up and go (TUG): 14.42 Functional gait assessment: TBD  GAIT: Distance walked: In clinic distances Assistive device utilized: None Level of assistance: Complete Independence Comments: Mildly unsteady, shortened step length B, slow   TODAY'S TREATMENT:                                                                                                                               DATE:  09/06/23 Education    PATIENT EDUCATION:  Education details: POC Person educated: Patient Education method: Explanation Education comprehension: verbalized understanding  HOME EXERCISE PROGRAM: TBD  ASSESSMENT:  CLINICAL IMPRESSION: Patient is a 82 y.o. who was seen today for physical therapy evaluation and treatment for weakness. Upon arrival he reports intermittent LBP and L hip pain. Unable to elicit pain during assessment, but he does present with stiffness in L hip, functional weakness, decreased balance, decreased safety and I with all functional mobility. He will benefit from PT to address his deficits and to addres shis back pain if it returns.  OBJECTIVE IMPAIRMENTS: Abnormal gait, decreased activity tolerance, decreased balance, decreased coordination, difficulty walking, decreased ROM, decreased strength, and pain.   ACTIVITY LIMITATIONS: standing, squatting, stairs, and locomotion level  PARTICIPATION LIMITATIONS: meal prep, cleaning, laundry, driving, shopping, and community activity  PERSONAL FACTORS: Past/current experiences are also affecting patient's functional outcome.   REHAB POTENTIAL: Good  CLINICAL DECISION MAKING: Stable/uncomplicated  EVALUATION COMPLEXITY: Low   GOALS: Goals reviewed with patient? Yes  SHORT TERM GOALS: Target date: 09/23/23 I with initial HEP Baseline: Goal status: INITIAL  LONG TERM GOALS: Target date: 11/15/23  I with Final HEP Baseline:  Goal status: INITIAL  2.  Decrease 5x STS to < 12 sec with no pushing off mat, no leaning back Baseline:  Goal status: INITIAL  3.  Patient will ambulate x at least 500' on level and unlevel surfaces MI with LRAD, no reports of pain Baseline:  Goal status: INITIAL  4.  Patient will score at least 24 on FGA Baseline:  Goal status: INITIAL  5.   Patient will report no episodes of back or hip pain x at least 3 weeks in a row. Baseline:  Goal status: INITIAL PLAN:  PT FREQUENCY: 1-2x/week  PT DURATION: 10 weeks  PLANNED INTERVENTIONS: 97110-Therapeutic exercises, 97530- Therapeutic activity, [id number]- Neuromuscular re-education, 97535- Self Care, 32440- Manual therapy, [id number]- Gait training, 97014- Electrical stimulation (unattended), [phone]- Traction (mechanical), [id number]- Ionotophoresis 4mg /ml Dexamethasone, Patient/Family education, Balance training, Dry Needling, Cryotherapy, Moist heat, and 97146- PT Re-evaluation  PLAN FOR NEXT SESSION: HEP, trunk stability, hip stretch, balance   Iona Beard, DPT 09/02/2023, 3:43 PM

## 2023-09-06 ENCOUNTER — Ambulatory Visit: Payer: Medicare PPO | Attending: Internal Medicine | Admitting: Physical Therapy

## 2023-09-06 ENCOUNTER — Encounter: Payer: Self-pay | Admitting: Physical Therapy

## 2023-09-06 DIAGNOSIS — R262 Difficulty in walking, not elsewhere classified: Secondary | ICD-10-CM | POA: Insufficient documentation

## 2023-09-06 DIAGNOSIS — M5442 Lumbago with sciatica, left side: Secondary | ICD-10-CM | POA: Diagnosis present

## 2023-09-06 DIAGNOSIS — R2681 Unsteadiness on feet: Secondary | ICD-10-CM | POA: Diagnosis present

## 2023-09-06 DIAGNOSIS — G8929 Other chronic pain: Secondary | ICD-10-CM | POA: Insufficient documentation

## 2023-09-06 DIAGNOSIS — M6281 Muscle weakness (generalized): Secondary | ICD-10-CM | POA: Diagnosis present

## 2023-09-10 NOTE — Therapy (Incomplete)
OUTPATIENT PHYSICAL THERAPY LOWER EXTREMITY EVALUATION   Patient Name: LORON BRAZILE MRN: [id number] DOB:May 29, 1941, 82 y.o., male Today's Date: 09/10/2023  END OF SESSION:   Past Medical History:  Diagnosis Date   Cancer determined by prostate biopsy (HCC)    Depression    Hypercholesterolemia    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   PTSD (post-traumatic stress disorder)    Past Surgical History:  Procedure Laterality Date   PROSTATE SURGERY     Patient Active Problem List   Diagnosis Date Noted   CAP (community acquired pneumonia) 05/04/2023   Acute metabolic encephalopathy 05/04/2023   Essential hypertension 05/04/2023   Unsteady gait 12/21/2016   Aseptic meningitis 12/08/2016   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   Acute renal failure superimposed on stage 3b chronic kidney disease (HCC) 12/08/2016   History of prostate cancer    Benign prostatic hyperplasia    PTSD (post-traumatic stress disorder)    Stage 3 chronic kidney disease (HCC)    Acute blood loss anemia    Acute encephalopathy    Hypophosphatemia    Seizure (HCC) 12/01/2016   Hyperglycemia 12/01/2016   Depression 12/01/2016   Prostate cancer (HCC) 12/01/2016   HLD (hyperlipidemia) 12/01/2016   Insomnia 12/01/2016    PCP: [name], MD  REFERRING PROVIDER: [name], MD   REFERRING DIAG: R53.1 (ICD-10-CM) [name]   THERAPY DIAG:  No diagnosis found.  Rationale for Evaluation and Treatment: Rehabilitation  ONSET DATE: 07/14/23  SUBJECTIVE:   SUBJECTIVE STATEMENT: Patient reports LBP, goes into L hip and leg.   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 Yes: NPRS scale: 10/10 Pain location: low  back and hip Pain description: aching Aggravating factors: not every day. Relieving factors: moving around  PRECAUTIONS: Fall  RED FLAGS: None   WEIGHT BEARING RESTRICTIONS: No  FALLS:  Has patient fallen in last 6 months? Yes. Number of falls 1,slipped while going out to his mailbox  LIVING ENVIRONMENT: Lives with: lives with their family Lives in: House/apartment Stairs: Yes: External: 2 steps; none Has following equipment at home: None  OCCUPATION: N/A, Patient reports he is very sedentary at home.  PLOF: Independent  PATIENT GOALS: Decrease pain  NEXT MD VISIT: unknown  OBJECTIVE:  Note: Objective measures were completed at Evaluation unless otherwise noted.  DIAGNOSTIC FINDINGS: X rays at the time of his fall were all negative for fractures in pelvis, hips, knees  COGNITION: Overall cognitive status: Within functional limits for tasks assessed     SENSATION: Patient reports inconsistent tingling in toes and also in L hip  EDEMA:  denies  MUSCLE LENGTH: Hamstrings: Right 70 deg; Left 55 deg Thomas test: Right WFL deg; Left WFL deg  POSTURE: No Significant postural limitations  PALPATION: Mild and intermittent TTP along L L4-L5 paraspinals  LUMBAR ROM: flex to mid shin, ext WNL, lateral side bend to mid thigh B, rotation to R 50%, L WNL  LOWER EXTREMITY ROM: R WNL, L hip generally mildly limited  LOWER EXTREMITY MMT: BLE 5/5 throughout, but demonstrates poor motor coordination and control, weak trunk  LUMBAR SPECIAL TESTS; slr AND LUMBAR COMPRESSION NEGATIVE.  LOWER EXTREMITY SPECIAL TESTS:  Hip special tests: Hip scouring test: negative  FUNCTIONAL  TESTS:  5 times sit to stand: 15.13 Timed up and go (TUG): 14.42 Functional gait assessment: TBD  GAIT: Distance walked: In clinic distances Assistive device utilized: None Level of assistance: Complete Independence Comments: Mildly unsteady, shortened step length B, slow   TODAY'S TREATMENT:                                                                                                                               DATE:  09/13/23 NuStep STS Leg ext HS curls Walking on beam  FGA   09/06/23 Education    PATIENT EDUCATION:  Education details: POC Person educated: Patient Education method: Explanation Education comprehension: verbalized understanding  HOME EXERCISE PROGRAM: TBD  ASSESSMENT:  CLINICAL IMPRESSION: Patient is a 82 y.o. who was seen today for physical therapy evaluation and treatment for weakness. Upon arrival he reports intermittent LBP and L hip pain. Unable to elicit pain during assessment, but he does present with stiffness in L hip, functional weakness, decreased balance, decreased safety and I with all functional mobility. He will benefit from PT to address his deficits and to addres shis back pain if it returns.  OBJECTIVE IMPAIRMENTS: Abnormal gait, decreased activity tolerance, decreased balance, decreased coordination, difficulty walking, decreased ROM, decreased strength, and pain.   ACTIVITY LIMITATIONS: standing, squatting, stairs, and locomotion level  PARTICIPATION LIMITATIONS: meal prep, cleaning, laundry, driving, shopping, and community activity  PERSONAL FACTORS: Past/current experiences are also affecting patient's functional outcome.   REHAB POTENTIAL: Good  CLINICAL DECISION MAKING: Stable/uncomplicated  EVALUATION COMPLEXITY: Low   GOALS: Goals reviewed with patient? Yes  SHORT TERM GOALS: Target date: 09/23/23 I with initial HEP Baseline: Goal status: INITIAL  LONG TERM GOALS: Target date: 11/15/23  I with Final HEP Baseline:  Goal status: INITIAL  2.  Decrease 5x STS to < 12 sec with no pushing off mat, no leaning back Baseline:  Goal status: INITIAL  3.  Patient will ambulate x at least 500' on level and unlevel surfaces MI with LRAD, no reports of pain Baseline:  Goal status: INITIAL  4.  Patient will  score at least 24 on FGA Baseline:  Goal status: INITIAL  5.  Patient will report no episodes of back or hip pain x at least 3 weeks in a row. Baseline:  Goal status: INITIAL PLAN:  PT FREQUENCY: 1-2x/week  PT DURATION: 10 weeks  PLANNED INTERVENTIONS: 97110-Therapeutic exercises, 97530- Therapeutic activity, [id number]- Neuromuscular re-education, 97535- Self Care, 78469- Manual therapy, [id number]- Gait training, 97014- Electrical stimulation (unattended), [phone]- Traction (mechanical), [id number]- Ionotophoresis 4mg /ml Dexamethasone, Patient/Family education, Balance training, Dry Needling, Cryotherapy, Moist heat, and 97146- PT Re-evaluation  PLAN FOR NEXT SESSION: HEP, trunk stability, hip stretch, balance   Iona Beard, DPT 09/10/2023, 8:34 AM

## 2023-09-13 ENCOUNTER — Ambulatory Visit: Payer: Medicare PPO

## 2023-09-14 ENCOUNTER — Encounter: Payer: Self-pay | Admitting: Physical Therapy

## 2023-09-14 ENCOUNTER — Ambulatory Visit: Payer: Medicare PPO | Admitting: Physical Therapy

## 2023-09-14 DIAGNOSIS — R262 Difficulty in walking, not elsewhere classified: Secondary | ICD-10-CM | POA: Diagnosis not present

## 2023-09-14 DIAGNOSIS — M6281 Muscle weakness (generalized): Secondary | ICD-10-CM

## 2023-09-14 DIAGNOSIS — R2681 Unsteadiness on feet: Secondary | ICD-10-CM

## 2023-09-14 DIAGNOSIS — G8929 Other chronic pain: Secondary | ICD-10-CM

## 2023-09-14 NOTE — Therapy (Signed)
OUTPATIENT PHYSICAL THERAPY LOWER EXTREMITY EVALUATION   Patient Name: Lance Todd MRN: [id number] DOB:1941-09-10, 82 y.o., male Today's Date: 09/14/2023  END OF SESSION:  PT End of Session - 09/14/23 1632     Visit Number 2    Date for PT Re-Evaluation 11/15/23    PT Start Time 1618    PT Stop Time 1701    PT Time Calculation (min) 43 min    Activity Tolerance Patient tolerated treatment well    Behavior During Therapy [facility] for tasks assessed/performed             Past Medical History:  Diagnosis Date   Cancer determined by prostate biopsy (HCC)    Depression    Hypercholesterolemia    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   PTSD (post-traumatic stress disorder)    Past Surgical History:  Procedure Laterality Date   PROSTATE SURGERY     Patient Active Problem List   Diagnosis Date Noted   CAP (community acquired pneumonia) 05/04/2023   Acute metabolic encephalopathy 05/04/2023   Essential hypertension 05/04/2023   Unsteady gait 12/21/2016   Aseptic meningitis 12/08/2016   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   Acute renal failure superimposed on stage 3b chronic kidney disease (HCC) 12/08/2016   History of prostate cancer    Benign prostatic hyperplasia    PTSD (post-traumatic stress disorder)    Stage 3 chronic kidney disease (HCC)    Acute blood loss anemia    Acute encephalopathy    Hypophosphatemia    Seizure (HCC) 12/01/2016   Hyperglycemia 12/01/2016   Depression 12/01/2016   Prostate cancer (HCC) 12/01/2016   HLD (hyperlipidemia) 12/01/2016   Insomnia 12/01/2016    PCP: Lance Funk, MD  REFERRING PROVIDER: [name], MD   REFERRING DIAG: R53.1 (ICD-10-CM) Lance Todd   THERAPY DIAG:  Difficulty in walking, not elsewhere classified  Muscle weakness (generalized)  Unsteadiness on feet  Chronic midline low back pain with left-sided sciatica  Rationale for Evaluation and  Treatment: Rehabilitation  ONSET DATE: 07/14/23  SUBJECTIVE:   SUBJECTIVE STATEMENT: Still hurting in the left knee, leg and low back, he is unsteady on his feet with walking   PERTINENT HISTORY:   Prostate cancer (HCC)   HLD (hyperlipidemia)   PTSD (post-traumatic stress disorder)   Acute 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 Yes: NPRS scale: 4/10 Pain location: low back and hip Pain description: aching Aggravating factors: not every day. Relieving factors: moving around  PRECAUTIONS: Fall  RED FLAGS: None   WEIGHT BEARING RESTRICTIONS: No  FALLS:  Has patient fallen in last 6 months? Yes. Number of falls 1,slipped while going out to his mailbox  LIVING ENVIRONMENT: Lives with: lives with their family Lives in: House/apartment Stairs: Yes: External: 2 steps; none Has following equipment at home: None  OCCUPATION: N/A, Patient reports he is very sedentary at home.  PLOF: Independent  PATIENT GOALS: Decrease pain  NEXT MD VISIT: unknown  OBJECTIVE:  Note: Objective measures were completed at Evaluation unless otherwise noted.  DIAGNOSTIC FINDINGS: X rays at the time of his fall were all negative for fractures in pelvis, hips, knees  COGNITION: Overall cognitive status: Within functional limits for tasks assessed     SENSATION: Patient reports inconsistent tingling in toes and also in L hip  EDEMA:  denies  MUSCLE LENGTH:  Hamstrings: Right 70 deg; Left 55 deg Thomas test: Right WFL deg; Left WFL deg  POSTURE: No Significant postural limitations  PALPATION: Mild and intermittent TTP along L L4-L5 paraspinals  LUMBAR ROM: flex to mid shin, ext WNL, lateral side bend to mid thigh B, rotation to R 50%, L WNL  LOWER EXTREMITY ROM: R WNL, L hip generally mildly limited  LOWER EXTREMITY MMT: BLE 5/5 throughout, but demonstrates poor motor  coordination and control, weak trunk  LUMBAR SPECIAL TESTS; slr AND LUMBAR COMPRESSION NEGATIVE.  LOWER EXTREMITY SPECIAL TESTS:  Hip special tests: Hip scouring test: negative  FUNCTIONAL TESTS:  5 times sit to stand: 15.13 Timed up and go (TUG): 14.42 Functional gait assessment: TBD  GAIT: Distance walked: In clinic distances Assistive device utilized: None Level of assistance: Complete Independence Comments: Mildly unsteady, shortened step length B, slow   TODAY'S TREATMENT:                                                                                                                              DATE:  09/14/23 Nustep level 4 x 5 minutes 20# leg curls 2x10 5# straight arm pulls 5# leg extension On airex in pbars ball toss Ball kicks  Feet on ball K2C, rotation, bridges and isometric abs Passive stretch LE's  09/06/23 Education    PATIENT EDUCATION:  Education details: POC Person educated: Patient Education method: Explanation Education comprehension: verbalized understanding  HOME EXERCISE PROGRAM: TBD  ASSESSMENT:  CLINICAL IMPRESSION: Patient is a 82 y.o. who was seen today for physical therapy evaluation and treatment for weakness.initiated gym activities for core and LE strength, started some balance and flexibility, did not have any pain during the treatment.  He will benefit from PT to address his deficits and to addres shis back pain if it returns.  OBJECTIVE IMPAIRMENTS: Abnormal gait, decreased activity tolerance, decreased balance, decreased coordination, difficulty walking, decreased ROM, decreased strength, and pain.   ACTIVITY LIMITATIONS: standing, squatting, stairs, and locomotion level  PARTICIPATION LIMITATIONS: meal prep, cleaning, laundry, driving, shopping, and community activity  PERSONAL FACTORS: Past/current experiences are also affecting patient's functional outcome.   REHAB POTENTIAL: Good  CLINICAL DECISION MAKING:  Stable/uncomplicated  EVALUATION COMPLEXITY: Low   GOALS: Goals reviewed with patient? Yes  SHORT TERM GOALS: Target date: 09/23/23 I with initial HEP Baseline: Goal status: INITIAL  LONG TERM GOALS: Target date: 11/15/23  I with Final HEP Baseline:  Goal status: INITIAL  2.  Decrease 5x STS to < 12 sec with no pushing off mat, no leaning back Baseline:  Goal status: INITIAL  3.  Patient will ambulate x at least 500' on level and unlevel surfaces MI with LRAD, no reports of pain Baseline:  Goal status: INITIAL  4.  Patient will score at least 24 on FGA Baseline:  Goal status: INITIAL  5.  Patient will report no episodes of back or hip pain x at least 3 weeks in a row.  Baseline:  Goal status: INITIAL PLAN:  PT FREQUENCY: 1-2x/week  PT DURATION: 10 weeks  PLANNED INTERVENTIONS: 97110-Therapeutic exercises, 97530- Therapeutic activity, [id number]- Neuromuscular re-education, 97535- Self Care, 69629- Manual therapy, [id number]- Gait training, 97014- Electrical stimulation (unattended), [phone]- Traction (mechanical), [id number]- Ionotophoresis 4mg /ml Dexamethasone, Patient/Family education, Balance training, Dry Needling, Cryotherapy, Moist heat, and 97146- PT Re-evaluation  PLAN FOR NEXT SESSION: HEP, trunk stability, hip stretch, balance   Stacie Glaze, PT 09/14/2023, 4:34 PM

## 2023-09-15 NOTE — Therapy (Signed)
OUTPATIENT PHYSICAL THERAPY LOWER EXTREMITY TREATMENT   Patient Name: Lance Todd MRN: [id number] DOB:1941-11-18, 82 y.o., male Today's Date: 09/16/2023  END OF SESSION:  PT End of Session - 09/16/23 1614     Visit Number 3    Date for PT Re-Evaluation 11/15/23    PT Start Time 1615    PT Stop Time 1700    PT Time Calculation (min) 45 min    Activity Tolerance Patient tolerated treatment well    Behavior During Therapy [facility] for tasks assessed/performed              Past Medical History:  Diagnosis Date   Cancer determined by prostate biopsy (HCC)    Depression    Hypercholesterolemia    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   PTSD (post-traumatic stress disorder)    Past Surgical History:  Procedure Laterality Date   PROSTATE SURGERY     Patient Active Problem List   Diagnosis Date Noted   CAP (community acquired pneumonia) 05/04/2023   Acute metabolic encephalopathy 05/04/2023   Essential hypertension 05/04/2023   Unsteady gait 12/21/2016   Aseptic meningitis 12/08/2016   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   Acute renal failure superimposed on stage 3b chronic kidney disease (HCC) 12/08/2016   History of prostate cancer    Benign prostatic hyperplasia    PTSD (post-traumatic stress disorder)    Stage 3 chronic kidney disease (HCC)    Acute blood loss anemia    Acute encephalopathy    Hypophosphatemia    Seizure (HCC) 12/01/2016   Hyperglycemia 12/01/2016   Depression 12/01/2016   Prostate cancer (HCC) 12/01/2016   HLD (hyperlipidemia) 12/01/2016   Insomnia 12/01/2016    PCP: [name], MD  REFERRING PROVIDER: [name], MD   REFERRING DIAG: R53.1 (ICD-10-CM) [name]   THERAPY DIAG:  Difficulty in walking, not elsewhere classified  Muscle weakness (generalized)  Unsteadiness on feet  Chronic midline low back pain with left-sided sciatica  Rationale for Evaluation and  Treatment: Rehabilitation  ONSET DATE: 07/14/23  SUBJECTIVE:   SUBJECTIVE STATEMENT: Was sore after the next Lance but it felt good. Still having pain in my back and L knee.   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 Yes: NPRS scale: 4/10 Pain location: low back and hip Pain description: aching Aggravating factors: not every Lance. Relieving factors: moving around  PRECAUTIONS: Fall  RED FLAGS: None   WEIGHT BEARING RESTRICTIONS: No  FALLS:  Has patient fallen in last 6 months? Yes. Number of falls 1,slipped while going out to his mailbox  LIVING ENVIRONMENT: Lives with: lives with their family Lives in: House/apartment Stairs: Yes: External: 2 steps; none Has following equipment at home: None  OCCUPATION: N/A, Patient reports he is very sedentary at home.  PLOF: Independent  PATIENT GOALS: Decrease pain  NEXT MD VISIT: unknown  OBJECTIVE:  Note: Objective measures were completed at Evaluation unless otherwise noted.  DIAGNOSTIC FINDINGS: X rays at the time of his fall were all negative for fractures in pelvis, hips, knees  COGNITION: Overall cognitive status: Within functional limits for tasks assessed     SENSATION: Patient reports inconsistent tingling in toes and also in L hip  EDEMA:  denies  MUSCLE LENGTH: Hamstrings: Right 70 deg; Left 55 deg Thomas test: Right WFL deg; Left WFL deg  POSTURE: No Significant postural limitations  PALPATION: Mild and intermittent TTP along L L4-L5 paraspinals  LUMBAR ROM: flex to mid shin, ext WNL, lateral side bend to mid thigh B, rotation to R 50%, L WNL  LOWER EXTREMITY ROM: R WNL, L hip generally mildly limited  LOWER EXTREMITY MMT: BLE 5/5 throughout, but demonstrates poor motor  coordination and control, weak trunk  LUMBAR SPECIAL TESTS; slr AND LUMBAR COMPRESSION NEGATIVE.  LOWER EXTREMITY SPECIAL TESTS:  Hip special tests: Hip scouring test: negative  FUNCTIONAL TESTS:  5 times sit to stand: 15.13 Timed up and go (TUG): 14.42 Functional gait assessment: TBD  GAIT: Distance walked: In clinic distances Assistive device utilized: None Level of assistance: Complete Independence Comments: Mildly unsteady, shortened step length B, slow   TODAY'S TREATMENT:                                                                                                                              DATE:  09/16/23 Bike L2 x76mins  STS 2x10 Leg press 20# 2x10 Straight arm pulls 5# 2x10 AR press 5# 2x10 Bridges 2x10 Trunk rotations x10 Passive stretching for LE's  blackTB extension 2x10   09/14/23 Nustep level 4 x 5 minutes 20# leg curls 2x10 5# straight arm pulls 5# leg extension On airex in pbars ball toss Ball kicks  Feet on ball K2C, rotation, bridges and isometric abs Passive stretch LE's  09/06/23 Education    PATIENT EDUCATION:  Education details: POC Person educated: Patient Education method: Explanation Education comprehension: verbalized understanding  HOME EXERCISE PROGRAM: Access Code: [email or web address] URL: [email or web address] Date: 09/16/2023 Prepared by: Cassie Freer  Exercises - Supine Bridge  - 1 x daily - 7 x weekly - 2 sets - 10 reps - Supine Lower Trunk Rotation  - 1 x daily - 7 x weekly - 2 sets - 10 reps - Supine Double Knee to Chest Modified  - 1 x daily - 7 x weekly - 2 reps - 30 hold - Supine Single Knee to Chest Stretch  - 1 x daily - 7 x weekly - 2 reps - 30 hold - Sit to Stand with Arms Crossed  - 1 x daily - 7 x weekly - 2 sets - 10 reps  ASSESSMENT:  CLINICAL IMPRESSION: Patient is a 82 y.o. who was seen today for physical therapy evaluation and treatment for weakness. Continued with activities for core and LE  strength. Also work on some flexibility, reports no pain throughout session. Does present with some core weakness noted with AR press with low weight. He will benefit from PT to address his deficits and to addres his back and knee pain. Initiated HEP  OBJECTIVE IMPAIRMENTS: Abnormal gait, decreased activity tolerance, decreased balance, decreased coordination, difficulty walking, decreased ROM, decreased strength, and pain.   ACTIVITY LIMITATIONS: standing, squatting, stairs, and locomotion level  PARTICIPATION LIMITATIONS: meal prep, cleaning, laundry, driving, shopping, and community activity  PERSONAL FACTORS: Past/current experiences are also affecting patient's functional outcome.   REHAB POTENTIAL: Good  CLINICAL DECISION MAKING: Stable/uncomplicated  EVALUATION COMPLEXITY: Low   GOALS: Goals reviewed with patient? Yes  SHORT TERM GOALS: Target date: 09/23/23 I with initial HEP Baseline: Goal status: INITIAL  LONG TERM GOALS: Target date: 11/15/23  I with Final HEP Baseline:  Goal status: INITIAL  2.  Decrease 5x STS to < 12 sec with no pushing off mat, no leaning back Baseline:  Goal status: INITIAL  3.  Patient will ambulate x at least 500' on level and unlevel surfaces MI with LRAD, no reports of pain Baseline:  Goal status: INITIAL  4.  Patient will score at least 24 on FGA Baseline:  Goal status: INITIAL  5.  Patient will report no episodes of back or hip pain x at least 3 weeks in a row. Baseline:  Goal status: INITIAL PLAN:  PT FREQUENCY: 1-2x/week  PT DURATION: 10 weeks  PLANNED INTERVENTIONS: 97110-Therapeutic exercises, 97530- Therapeutic activity, [id number]- Neuromuscular re-education, 97535- Self Care, 56433- Manual therapy, [id number]- Gait training, 97014- Electrical stimulation (unattended), [phone]- Traction (mechanical), [id number]- Ionotophoresis 4mg /ml Dexamethasone, Patient/Family education, Balance training, Dry Needling, Cryotherapy, Moist heat, and  97146- PT Re-evaluation  PLAN FOR NEXT SESSION: trunk stability, hip stretch, balance   Stacie Glaze, PT 09/16/2023, 4:57 PM

## 2023-09-16 ENCOUNTER — Ambulatory Visit: Payer: Medicare PPO

## 2023-09-16 DIAGNOSIS — R2681 Unsteadiness on feet: Secondary | ICD-10-CM

## 2023-09-16 DIAGNOSIS — R262 Difficulty in walking, not elsewhere classified: Secondary | ICD-10-CM

## 2023-09-16 DIAGNOSIS — G8929 Other chronic pain: Secondary | ICD-10-CM

## 2023-09-16 DIAGNOSIS — M6281 Muscle weakness (generalized): Secondary | ICD-10-CM

## 2023-09-20 ENCOUNTER — Encounter: Payer: Self-pay | Admitting: Physical Therapy

## 2023-09-20 ENCOUNTER — Ambulatory Visit: Payer: Medicare PPO | Admitting: Physical Therapy

## 2023-09-20 DIAGNOSIS — R262 Difficulty in walking, not elsewhere classified: Secondary | ICD-10-CM | POA: Diagnosis not present

## 2023-09-20 DIAGNOSIS — R2681 Unsteadiness on feet: Secondary | ICD-10-CM

## 2023-09-20 DIAGNOSIS — M6281 Muscle weakness (generalized): Secondary | ICD-10-CM

## 2023-09-20 DIAGNOSIS — G8929 Other chronic pain: Secondary | ICD-10-CM

## 2023-09-20 NOTE — Therapy (Signed)
OUTPATIENT PHYSICAL THERAPY LOWER EXTREMITY TREATMENT   Patient Name: Lance Todd MRN: [id number] DOB:[birth year], 82 y.o., male Today's Date: 09/20/2023  END OF SESSION:  PT End of Session - 09/20/23 1547     Visit Number 4    Date for PT Re-Evaluation 11/15/23    PT Start Time 1542    PT Stop Time 1623    PT Time Calculation (min) 41 min    Activity Tolerance Patient tolerated treatment well    Behavior During Therapy [name] for tasks assessed/performed            Past Medical History:  Diagnosis Date   Cancer determined by prostate biopsy (HCC)    Depression    Hypercholesterolemia    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   PTSD (post-traumatic stress disorder)    Past Surgical History:  Procedure Laterality Date   PROSTATE SURGERY     Patient Active Problem List   Diagnosis Date Noted   CAP (community acquired pneumonia) 05/04/2023   Acute metabolic encephalopathy 05/04/2023   Essential hypertension 05/04/2023   Unsteady gait 12/21/2016   Aseptic meningitis 12/08/2016   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   Acute renal failure superimposed on stage 3b chronic kidney disease (HCC) 12/08/2016   History of prostate cancer    Benign prostatic hyperplasia    PTSD (post-traumatic stress disorder)    Stage 3 chronic kidney disease (HCC)    Acute blood loss anemia    Acute encephalopathy    Hypophosphatemia    Seizure (HCC) 12/01/2016   Hyperglycemia 12/01/2016   Depression 12/01/2016   Prostate cancer (HCC) 12/01/2016   HLD (hyperlipidemia) 12/01/2016   Insomnia 12/01/2016    PCP: [name], MD  REFERRING PROVIDER: [name], MD   REFERRING DIAG: R53.1 (ICD-10-CM) [name]   THERAPY DIAG:  Difficulty in walking, not elsewhere classified  Muscle weakness (generalized)  Unsteadiness on feet  Chronic midline low back pain with left-sided sciatica  Rationale for Evaluation and  Treatment: Rehabilitation  ONSET DATE: 07/14/23  SUBJECTIVE:   SUBJECTIVE STATEMENT: Patient reports some mild L lower back pain, but has not impeded him.  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 Yes: NPRS scale: 4/10 Pain location: low back and hip Pain description: aching Aggravating factors: not every day. Relieving factors: moving around  PRECAUTIONS: Fall  RED FLAGS: None   WEIGHT BEARING RESTRICTIONS: No  FALLS:  Has patient fallen in last 6 months? Yes. Number of falls 1,slipped while going out to his mailbox  LIVING ENVIRONMENT: Lives with: lives with their family Lives in: House/apartment Stairs: Yes: External: 2 steps; none Has following equipment at home: None  OCCUPATION: N/A, Patient reports he is very sedentary at home.  PLOF: Independent  PATIENT GOALS: Decrease pain  NEXT MD VISIT: unknown  OBJECTIVE:  Note: Objective measures were completed at Evaluation unless otherwise noted.  DIAGNOSTIC FINDINGS: X rays at the time of his fall were all negative for fractures in pelvis, hips, knees  COGNITION: Overall cognitive status: Within functional limits for tasks assessed     SENSATION: Patient reports inconsistent tingling in toes and also in L hip  EDEMA:  denies  MUSCLE LENGTH: Hamstrings: Right 70 deg; Left 55 deg  Thomas test: Right WFL deg; Left WFL deg  POSTURE: No Significant postural limitations  PALPATION: Mild and intermittent TTP along L L4-L5 paraspinals  LUMBAR ROM: flex to mid shin, ext WNL, lateral side bend to mid thigh B, rotation to R 50%, L WNL  LOWER EXTREMITY ROM: R WNL, L hip generally mildly limited  LOWER EXTREMITY MMT: BLE 5/5 throughout, but demonstrates poor motor coordination and  control, weak trunk  LUMBAR SPECIAL TESTS; slr AND LUMBAR COMPRESSION NEGATIVE.  LOWER EXTREMITY SPECIAL TESTS:  Hip special tests: Hip scouring test: negative  FUNCTIONAL TESTS:  5 times sit to stand: 15.13 Timed up and go (TUG): 14.42 Functional gait assessment: TBD  GAIT: Distance walked: In clinic distances Assistive device utilized: None Level of assistance: Complete Independence Comments: Mildly unsteady, shortened step length B, slow   TODAY'S TREATMENT:                                                                                                                              DATE:  09/20/23 NuStep L5 x 6 minutes Supine piriformis stretch B, muscle palpation to L hip an gluts, no TTP. SI screen performed on L due to reports of intermittent pain in L hip in area of SI- negative. Supine trunk and LE strength- bridge, clamshells with G tband resistance Seated HS stretch 3 x 15 sec to each leg Sit to stand x 10 B side to side step with G tband at knees x 10 each way, 1 episode of LOB Standing shoulder ext, rows, ER, Red Tband, 10 reps  09/16/23 Bike L2 x30mins  STS 2x10 Leg press 20# 2x10 Straight arm pulls 5# 2x10 AR press 5# 2x10 Bridges 2x10 Trunk rotations x10 Passive stretching for LE's  blackTB extension 2x10  09/14/23 Nustep level 4 x 5 minutes 20# leg curls 2x10 5# straight arm pulls 5# leg extension On airex in pbars ball toss Ball kicks  Feet on ball K2C, rotation, bridges and isometric abs Passive stretch LE's  09/06/23 Education    PATIENT EDUCATION:  Education details: POC Person educated: Patient Education method: Explanation Education comprehension: verbalized understanding  HOME EXERCISE PROGRAM: Access Code: [email or web address] URL: [email or web address] Date: 09/16/2023 Prepared by: Cassie Freer  Exercises - Supine Bridge  - 1 x daily - 7 x weekly - 2 sets - 10 reps - Supine Lower Trunk Rotation  - 1 x daily - 7 x weekly - 2  sets - 10 reps - Supine Double Knee to Chest Modified  - 1 x daily - 7 x weekly - 2 reps - 30 hold - Supine Single Knee to Chest Stretch  - 1 x daily - 7 x weekly - 2 reps - 30 hold - Sit to Stand with Arms Crossed  - 1 x daily - 7 x weekly - 2 sets - 10 reps  ASSESSMENT:  CLINICAL IMPRESSION: Patient reports that he is performing HEP. His program was updated today with some additional  stretching exercises as well as trunk stabilization and LE strengthening. He performed all exercises without pain. Had some difficulty with balance in side stepping.  OBJECTIVE IMPAIRMENTS: Abnormal gait, decreased activity tolerance, decreased balance, decreased coordination, difficulty walking, decreased ROM, decreased strength, and pain.   ACTIVITY LIMITATIONS: standing, squatting, stairs, and locomotion level  PARTICIPATION LIMITATIONS: meal prep, cleaning, laundry, driving, shopping, and community activity  PERSONAL FACTORS: Past/current experiences are also affecting patient's functional outcome.   REHAB POTENTIAL: Good  CLINICAL DECISION MAKING: Stable/uncomplicated  EVALUATION COMPLEXITY: Low   GOALS: Goals reviewed with patient? Yes  SHORT TERM GOALS: Target date: 09/23/23 I with initial HEP Baseline: Goal status: 09/20/23-Provided with initial HEP and performing, met  LONG TERM GOALS: Target date: 11/15/23  I with Final HEP Baseline:  Goal status: INITIAL  2.  Decrease 5x STS to < 12 sec with no pushing off mat, no leaning back Baseline:  Goal status: INITIAL  3.  Patient will ambulate x at least 500' on level and unlevel surfaces MI with LRAD, no reports of pain Baseline:  Goal status: INITIAL  4.  Patient will score at least 24 on FGA Baseline:  Goal status: INITIAL  5.  Patient will report no episodes of back or hip pain x at least 3 weeks in a row. Baseline:  Goal status: INITIAL PLAN:  PT FREQUENCY: 1-2x/week  PT DURATION: 10 weeks  PLANNED INTERVENTIONS:  97110-Therapeutic exercises, 97530- Therapeutic activity, [id number]- Neuromuscular re-education, 97535- Self Care, [id number]- Manual therapy, [id number]- Gait training, 97014- Electrical stimulation (unattended), [phone]- Traction (mechanical), [id number]- Ionotophoresis 4mg /ml Dexamethasone, Patient/Family education, Balance training, Dry Needling, Cryotherapy, Moist heat, and 97146- PT Re-evaluation  PLAN FOR NEXT SESSION: trunk stability, hip stretch, balance  Oley Balm DPT 09/20/23 4:30 PM

## 2023-09-22 ENCOUNTER — Ambulatory Visit: Payer: Medicare PPO | Admitting: Physical Therapy

## 2023-09-22 ENCOUNTER — Encounter: Payer: Self-pay | Admitting: Physical Therapy

## 2023-09-22 DIAGNOSIS — R262 Difficulty in walking, not elsewhere classified: Secondary | ICD-10-CM | POA: Diagnosis not present

## 2023-09-22 DIAGNOSIS — R2681 Unsteadiness on feet: Secondary | ICD-10-CM

## 2023-09-22 DIAGNOSIS — G8929 Other chronic pain: Secondary | ICD-10-CM

## 2023-09-22 DIAGNOSIS — M6281 Muscle weakness (generalized): Secondary | ICD-10-CM

## 2023-09-22 NOTE — Therapy (Signed)
OUTPATIENT PHYSICAL THERAPY LOWER EXTREMITY TREATMENT   Patient Name: Lance Todd MRN: [id number] DOB:08-18-1941, 82 y.o., male Today's Date: 09/22/2023  END OF SESSION:  PT End of Session - 09/22/23 1549     Visit Number 5    Date for PT Re-Evaluation 11/15/23    PT Start Time 1514    PT Stop Time 1555    PT Time Calculation (min) 41 min    Activity Tolerance Patient tolerated treatment well    Behavior During Therapy [facility] for tasks assessed/performed             Past Medical History:  Diagnosis Date   Cancer determined by prostate biopsy (HCC)    Depression    Hypercholesterolemia    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   PTSD (post-traumatic stress disorder)    Past Surgical History:  Procedure Laterality Date   PROSTATE SURGERY     Patient Active Problem List   Diagnosis Date Noted   CAP (community acquired pneumonia) 05/04/2023   Acute metabolic encephalopathy 05/04/2023   Essential hypertension 05/04/2023   Unsteady gait 12/21/2016   Aseptic meningitis 12/08/2016   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   Acute renal failure superimposed on stage 3b chronic kidney disease (HCC) 12/08/2016   History of prostate cancer    Benign prostatic hyperplasia    PTSD (post-traumatic stress disorder)    Stage 3 chronic kidney disease (HCC)    Acute blood loss anemia    Acute encephalopathy    Hypophosphatemia    Seizure (HCC) 12/01/2016   Hyperglycemia 12/01/2016   Depression 12/01/2016   Prostate cancer (HCC) 12/01/2016   HLD (hyperlipidemia) 12/01/2016   Insomnia 12/01/2016    PCP: [name], MD  REFERRING PROVIDER: [name], MD   REFERRING DIAG: R53.1 (ICD-10-CM) [name]   THERAPY DIAG:  Difficulty in walking, not elsewhere classified  Muscle weakness (generalized)  Unsteadiness on feet  Chronic midline low back pain with left-sided sciatica  Rationale for Evaluation and  Treatment: Rehabilitation  ONSET DATE: 07/14/23  SUBJECTIVE:   SUBJECTIVE STATEMENT: Patient reports some mild L lower back pain, as well as L calf pain. 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 Yes: NPRS scale: 4/10 Pain location: low back and hip Pain description: aching Aggravating factors: not every day. Relieving factors: moving around  PRECAUTIONS: Fall  RED FLAGS: None   WEIGHT BEARING RESTRICTIONS: No  FALLS:  Has patient fallen in last 6 months? Yes. Number of falls 1,slipped while going out to his mailbox  LIVING ENVIRONMENT: Lives with: lives with their family Lives in: House/apartment Stairs: Yes: External: 2 steps; none Has following equipment at home: None  OCCUPATION: N/A, Patient reports he is very sedentary at home.  PLOF: Independent  PATIENT GOALS: Decrease pain  NEXT MD VISIT: unknown  OBJECTIVE:  Note: Objective measures were completed at Evaluation unless otherwise noted.  DIAGNOSTIC FINDINGS: X rays at the time of his fall were all negative for fractures in pelvis, hips, knees  COGNITION: Overall cognitive status: Within functional limits for tasks assessed     SENSATION: Patient reports inconsistent tingling in toes and also in L hip  EDEMA:  denies  MUSCLE LENGTH: Hamstrings:  Right 70 deg; Left 55 deg Thomas test: Right WFL deg; Left WFL deg  POSTURE: No Significant postural limitations  PALPATION: Mild and intermittent TTP along L L4-L5 paraspinals  LUMBAR ROM: flex to mid shin, ext WNL, lateral side bend to mid thigh B, rotation to R 50%, L WNL  LOWER EXTREMITY ROM: R WNL, L hip generally mildly limited  LOWER EXTREMITY MMT: BLE 5/5 throughout, but demonstrates poor motor  coordination and control, weak trunk  LUMBAR SPECIAL TESTS; slr AND LUMBAR COMPRESSION NEGATIVE.  LOWER EXTREMITY SPECIAL TESTS:  Hip special tests: Hip scouring test: negative  FUNCTIONAL TESTS:  5 times sit to stand: 15.13 Timed up and go (TUG): 14.42 Functional gait assessment: TBD  GAIT: Distance walked: In clinic distances Assistive device utilized: None Level of assistance: Complete Independence Comments: Mildly unsteady, shortened step length B, slow   TODAY'S TREATMENT:                                                                                                                              DATE:  09/22/23 NuStep L5 x 6 minutes 5x STS, see goals Ambulation x 100' for gait assessment. Patient overall lower toned, slow, slightly drags feet. However, when he increased his speed to a more normal pace, his B foot clearance improved with increased arm swing, and slightly taller posture maintained. Performed balance training on floor mat, performing B side stepping, then progressing to alternately tapping a cone on the floor with each foot and side stepping in each direction. X2 each way, CGA, no LOB, but unsteadiness noted. Repeated activity while holding 4# ball and adding chest press at each cone to engage upper body. Improved knee lift noted at each cone, possibly due to more engaged trunk. Standing step taps in all directions upon therapist command, returning to center each time. Increased speed of movement, all completed with CGA, no unsteadiness. Alternating step taps on 4" step, no UE support, increasing speed of movement to quick alternating taps, no LOB, able to clear his toes with each tap, approximately 30 times each leg.  09/20/23 NuStep L5 x 6 minutes Supine piriformis stretch B, muscle palpation to L hip an gluts, no TTP. SI screen performed on L due to reports of intermittent pain in L hip in area of SI- negative. Supine trunk and LE strength- bridge, clamshells  with G tband resistance Seated HS stretch 3 x 15 sec to each leg Sit to stand x 10 B side to side step with G tband at knees x 10 each way, 1 episode of LOB Standing shoulder ext, rows, ER, Red Tband, 10 reps  09/16/23 Bike L2 x93mins  STS 2x10 Leg press 20# 2x10 Straight arm pulls 5# 2x10 AR press 5# 2x10 Bridges 2x10 Trunk rotations x10 Passive stretching for LE's  blackTB extension 2x10  09/14/23 Nustep level 4 x 5 minutes 20# leg curls 2x10 5# straight arm pulls 5# leg extension On airex in pbars  ball toss Ball kicks  Feet on ball K2C, rotation, bridges and isometric abs Passive stretch LE's  09/06/23 Education    PATIENT EDUCATION:  Education details: POC Person educated: Patient Education method: Explanation Education comprehension: verbalized understanding  HOME EXERCISE PROGRAM: Access Code: [email or web address] URL: [email or web address] Date: 09/16/2023 Prepared by: Cassie Freer  Exercises - Supine Bridge  - 1 x daily - 7 x weekly - 2 sets - 10 reps - Supine Lower Trunk Rotation  - 1 x daily - 7 x weekly - 2 sets - 10 reps - Supine Double Knee to Chest Modified  - 1 x daily - 7 x weekly - 2 reps - 30 hold - Supine Single Knee to Chest Stretch  - 1 x daily - 7 x weekly - 2 reps - 30 hold - Sit to Stand with Arms Crossed  - 1 x daily - 7 x weekly - 2 sets - 10 reps  ASSESSMENT:  CLINICAL IMPRESSION: Patient reports that he is performing HEP. Patient's daughter expressed concern regarding him dragging his feet during gait and concerned about weak hip flexors. Incorporated some additional assessment into treatment today. Patient tends to walk very slowly, with generally low, but normal tone. When asked to walk a little quicker he did automatically pick his feet up better and generated normal arm swing. He demonstrated the ability to walk on unlevel surfaces, but did have some unsteadiness. Will continue to address overall strength, including trunk stability,  as well as activity tolerance and his back pain to decrease fall risk and discomfort.  OBJECTIVE IMPAIRMENTS: Abnormal gait, decreased activity tolerance, decreased balance, decreased coordination, difficulty walking, decreased ROM, decreased strength, and pain.   ACTIVITY LIMITATIONS: standing, squatting, stairs, and locomotion level  PARTICIPATION LIMITATIONS: meal prep, cleaning, laundry, driving, shopping, and community activity  PERSONAL FACTORS: Past/current experiences are also affecting patient's functional outcome.   REHAB POTENTIAL: Good  CLINICAL DECISION MAKING: Stable/uncomplicated  EVALUATION COMPLEXITY: Low   GOALS: Goals reviewed with patient? Yes  SHORT TERM GOALS: Target date: 09/23/23 I with initial HEP Baseline: Goal status: 09/20/23-Provided with initial HEP and performing, met  LONG TERM GOALS: Target date: 11/15/23  I with Final HEP Baseline:  Goal status: INITIAL  2.  Decrease 5x STS to < 12 sec with no pushing off mat, no leaning back Baseline:  Goal status: 09/22/23-16 sec, appeared to fatigue slightly toward end with posterior lean.  3.  Patient will ambulate x at least 500' on level and unlevel surfaces MI with LRAD, no reports of pain Baseline:  Goal status: INITIAL  4.  Patient will score at least 24 on FGA Baseline:  Goal status: INITIAL  5.  Patient will report no episodes of back or hip pain x at least 3 weeks in a row. Baseline:  Goal status: 09/22/23-Reports 5/10 pain in L low back and hip. Worse first thing in the morning. PLAN:  PT FREQUENCY: 1-2x/week  PT DURATION: 10 weeks  PLANNED INTERVENTIONS: 97110-Therapeutic exercises, 97530- Therapeutic activity, [id number]- Neuromuscular re-education, 97535- Self Care, [id number]- Manual therapy, [id number]- Gait training, 97014- Electrical stimulation (unattended), [phone]- Traction (mechanical), [id number]- Ionotophoresis 4mg /ml Dexamethasone, Patient/Family education, Balance training, Dry Needling,  Cryotherapy, Moist heat, and 97146- PT Re-evaluation  PLAN FOR NEXT SESSION: trunk stability, hip stretch, balance  Oley Balm DPT 09/22/23 4:24 PM

## 2023-09-24 NOTE — Therapy (Signed)
OUTPATIENT PHYSICAL THERAPY LOWER EXTREMITY TREATMENT   Patient Name: Lance Todd MRN: [id number] DOB:10-25-1941, 82 y.o., male Today's Date: 09/27/2023  END OF SESSION:  PT End of Session - 09/27/23 1536     Visit Number 6    Date for PT Re-Evaluation 11/15/23    PT Start Time 1535    PT Stop Time 1615    PT Time Calculation (min) 40 min    Activity Tolerance Patient tolerated treatment well    Behavior During Therapy Lance Todd for tasks assessed/performed              Past Medical History:  Diagnosis Date   Cancer determined by prostate biopsy (HCC)    Depression    Hypercholesterolemia    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   PTSD (post-traumatic stress disorder)    Past Surgical History:  Procedure Laterality Date   PROSTATE SURGERY     Patient Active Problem List   Diagnosis Date Noted   CAP (community acquired pneumonia) 05/04/2023   Acute metabolic encephalopathy 05/04/2023   Essential hypertension 05/04/2023   Unsteady gait 12/21/2016   Aseptic meningitis 12/08/2016   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   Acute renal failure superimposed on stage 3b chronic kidney disease (HCC) 12/08/2016   History of prostate cancer    Benign prostatic hyperplasia    PTSD (post-traumatic stress disorder)    Stage 3 chronic kidney disease (HCC)    Acute blood loss anemia    Acute encephalopathy    Hypophosphatemia    Seizure (HCC) 12/01/2016   Hyperglycemia 12/01/2016   Depression 12/01/2016   Prostate cancer (HCC) 12/01/2016   HLD (hyperlipidemia) 12/01/2016   Insomnia 12/01/2016    PCP: Lance Funk, MD  REFERRING PROVIDER: [name], MD   REFERRING DIAG: R53.1 (ICD-10-CM) Lance Todd   THERAPY DIAG:  Difficulty in walking, not elsewhere classified  Muscle weakness (generalized)  Unsteadiness on feet  Chronic midline low back pain with left-sided sciatica  Rationale for Evaluation and  Treatment: Rehabilitation  ONSET DATE: 07/14/23  SUBJECTIVE:   SUBJECTIVE STATEMENT: Doing alright got a little problem in the hamstring.   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 Yes: NPRS scale: 4/10 Pain location: low back and hip Pain description: aching Aggravating factors: not every day. Relieving factors: moving around  PRECAUTIONS: Fall  RED FLAGS: None   WEIGHT BEARING RESTRICTIONS: No  FALLS:  Has patient fallen in last 6 months? Yes. Number of falls 1,slipped while going out to his mailbox  LIVING ENVIRONMENT: Lives with: lives with their family Lives in: House/apartment Stairs: Yes: External: 2 steps; none Has following equipment at home: None  OCCUPATION: N/A, Patient reports he is very sedentary at home.  PLOF: Independent  PATIENT GOALS: Decrease pain  NEXT MD VISIT: unknown  OBJECTIVE:  Note: Objective measures were completed at Evaluation unless otherwise noted.  DIAGNOSTIC FINDINGS: X rays at the time of his fall were all negative for fractures in pelvis, hips, knees  COGNITION: Overall cognitive status: Within functional limits for tasks assessed     SENSATION: Patient reports inconsistent tingling in toes and also in L hip  EDEMA:  denies  MUSCLE LENGTH: Hamstrings: Right 70 deg; Left 55 deg Lance Todd  test: Right WFL deg; Left WFL deg  POSTURE: No Significant postural limitations  PALPATION: Mild and intermittent TTP along L L4-L5 paraspinals  LUMBAR ROM: flex to mid shin, ext WNL, lateral side bend to mid thigh B, rotation to R 50%, L WNL  LOWER EXTREMITY ROM: R WNL, L hip generally mildly limited  LOWER EXTREMITY MMT: BLE 5/5 throughout, but demonstrates poor motor coordination and control, weak  trunk  LUMBAR SPECIAL TESTS; slr AND LUMBAR COMPRESSION NEGATIVE.  LOWER EXTREMITY SPECIAL TESTS:  Hip special tests: Hip scouring test: negative  FUNCTIONAL TESTS:  5 times sit to stand: 15.13 Timed up and go (TUG): 14.42 Functional gait assessment: TBD  GAIT: Distance walked: In clinic distances Assistive device utilized: None Level of assistance: Complete Independence Comments: Mildly unsteady, shortened step length B, slow   TODAY'S TREATMENT:                                                                                                                              DATE:  09/27/23 Bike L3 x62mins HS stretch 30s  Feet on pball K2C, bridges, rotations x10 STS holding yellow ball 2x10  Walking on beam On airex cone taps Step ups 6" Seated row and lat pull down 20# 2x10    09/22/23 NuStep L5 x 6 minutes 5x STS, see goals Ambulation x 100' for gait assessment. Patient overall lower toned, slow, slightly drags feet. However, when he increased his speed to a more normal pace, his B foot clearance improved with increased arm swing, and slightly taller posture maintained. Performed balance training on floor mat, performing B side stepping, then progressing to alternately tapping a cone on the floor with each foot and side stepping in each direction. X2 each way, CGA, no LOB, but unsteadiness noted. Repeated activity while holding 4# ball and adding chest press at each cone to engage upper body. Improved knee lift noted at each cone, possibly due to more engaged trunk. Standing step taps in all directions upon therapist command, returning to center each time. Increased speed of movement, all completed with CGA, no unsteadiness. Alternating step taps on 4" step, no UE support, increasing speed of movement to quick alternating taps, no LOB, able to clear his toes with each tap, approximately 30 times each leg.  09/20/23 NuStep L5 x 6 minutes Supine piriformis stretch B, muscle  palpation to L hip an gluts, no TTP. SI screen performed on L due to reports of intermittent pain in L hip in area of SI- negative. Supine trunk and LE strength- bridge, clamshells with G tband resistance Seated HS stretch 3 x 15 sec to each leg Sit to stand x 10 B side to side step with G tband at knees x 10 each way, 1 episode of LOB Standing shoulder ext, rows, ER, Red Tband, 10 reps  09/16/23 Bike L2 x44mins  STS 2x10 Leg press 20# 2x10 Straight arm pulls 5# 2x10 AR press 5# 2x10 Henreitta Leber  2x10 Trunk rotations x10 Passive stretching for LE's  blackTB extension 2x10  09/14/23 Nustep level 4 x 5 minutes 20# leg curls 2x10 5# straight arm pulls 5# leg extension On airex in pbars ball toss Ball kicks  Feet on ball K2C, rotation, bridges and isometric abs Passive stretch LE's  09/06/23 Education    PATIENT EDUCATION:  Education details: POC Person educated: Patient Education method: Explanation Education comprehension: verbalized understanding  HOME EXERCISE PROGRAM: Access Code: [email or web address] URL: [email or web address] Date: 09/16/2023 Prepared by: Cassie Freer  Exercises - Supine Bridge  - 1 x daily - 7 x weekly - 2 sets - 10 reps - Supine Lower Trunk Rotation  - 1 x daily - 7 x weekly - 2 sets - 10 reps - Supine Double Knee to Chest Modified  - 1 x daily - 7 x weekly - 2 reps - 30 hold - Supine Single Knee to Chest Stretch  - 1 x daily - 7 x weekly - 2 reps - 30 hold - Sit to Stand with Arms Crossed  - 1 x daily - 7 x weekly - 2 sets - 10 reps  ASSESSMENT:  CLINICAL IMPRESSION: Patient reports some tightness and pulling in his hamstring today. With sit to stands he losses his balance backwards at times and legs hit table to help keep him upright. He is unsteady on foam surfaces. Cues needed for step ups, as he tends to mess up the sequencing. Also has some LOB stepping backwards off step, but able to regain balance independently. Will continue to address  overall strength, including trunk stability, as well as activity tolerance and his back pain to decrease fall risk and discomfort.  OBJECTIVE IMPAIRMENTS: Abnormal gait, decreased activity tolerance, decreased balance, decreased coordination, difficulty walking, decreased ROM, decreased strength, and pain.   ACTIVITY LIMITATIONS: standing, squatting, stairs, and locomotion level  PARTICIPATION LIMITATIONS: meal prep, cleaning, laundry, driving, shopping, and community activity  PERSONAL FACTORS: Past/current experiences are also affecting patient's functional outcome.   REHAB POTENTIAL: Good  CLINICAL DECISION MAKING: Stable/uncomplicated  EVALUATION COMPLEXITY: Low   GOALS: Goals reviewed with patient? Yes  SHORT TERM GOALS: Target date: 09/23/23 I with initial HEP Baseline: Goal status: 09/20/23-Provided with initial HEP and performing, met  LONG TERM GOALS: Target date: 11/15/23  I with Final HEP Baseline:  Goal status: INITIAL  2.  Decrease 5x STS to < 12 sec with no pushing off mat, no leaning back Baseline:  Goal status: 09/22/23-16 sec, appeared to fatigue slightly toward end with posterior lean.  3.  Patient will ambulate x at least 500' on level and unlevel surfaces MI with LRAD, no reports of pain Baseline:  Goal status: INITIAL  4.  Patient will score at least 24 on FGA Baseline:  Goal status: INITIAL  5.  Patient will report no episodes of back or hip pain x at least 3 weeks in a row. Baseline:  Goal status: 09/22/23-Reports 5/10 pain in L low back and hip. Worse first thing in the morning. PLAN:  PT FREQUENCY: 1-2x/week  PT DURATION: 10 weeks  PLANNED INTERVENTIONS: 97110-Therapeutic exercises, 97530- Therapeutic activity, [id number]- Neuromuscular re-education, 97535- Self Care, 16109- Manual therapy, [id number]- Gait training, 97014- Electrical stimulation (unattended), [phone]- Traction (mechanical), [id number]- Ionotophoresis 4mg /ml Dexamethasone, Patient/Family  education, Balance training, Dry Needling, Cryotherapy, Moist heat, and 97146- PT Re-evaluation  PLAN FOR NEXT SESSION: trunk stability, hip stretch, balance  Oley Balm DPT 09/27/23 4:12 PM

## 2023-09-27 ENCOUNTER — Ambulatory Visit: Payer: Medicare PPO | Attending: Internal Medicine

## 2023-09-27 DIAGNOSIS — R262 Difficulty in walking, not elsewhere classified: Secondary | ICD-10-CM | POA: Insufficient documentation

## 2023-09-27 DIAGNOSIS — R2681 Unsteadiness on feet: Secondary | ICD-10-CM | POA: Diagnosis not present

## 2023-09-27 DIAGNOSIS — G8929 Other chronic pain: Secondary | ICD-10-CM | POA: Insufficient documentation

## 2023-09-27 DIAGNOSIS — M6281 Muscle weakness (generalized): Secondary | ICD-10-CM | POA: Diagnosis not present

## 2023-09-27 DIAGNOSIS — M5442 Lumbago with sciatica, left side: Secondary | ICD-10-CM | POA: Diagnosis not present

## 2023-10-04 ENCOUNTER — Ambulatory Visit: Payer: Medicare PPO | Admitting: Physical Therapy

## 2023-10-04 ENCOUNTER — Encounter: Payer: Self-pay | Admitting: Physical Therapy

## 2023-10-04 DIAGNOSIS — R2681 Unsteadiness on feet: Secondary | ICD-10-CM | POA: Diagnosis not present

## 2023-10-04 DIAGNOSIS — G8929 Other chronic pain: Secondary | ICD-10-CM | POA: Diagnosis not present

## 2023-10-04 DIAGNOSIS — M6281 Muscle weakness (generalized): Secondary | ICD-10-CM

## 2023-10-04 DIAGNOSIS — M5442 Lumbago with sciatica, left side: Secondary | ICD-10-CM | POA: Diagnosis not present

## 2023-10-04 DIAGNOSIS — R262 Difficulty in walking, not elsewhere classified: Secondary | ICD-10-CM | POA: Diagnosis not present

## 2023-10-04 NOTE — Therapy (Signed)
OUTPATIENT PHYSICAL THERAPY LOWER EXTREMITY TREATMENT   Patient Name: FRANK NEER MRN: [id number] DOB:May 16, 1941, 82 y.o., male Today's Date: 10/04/2023  END OF SESSION:  PT End of Session - 10/04/23 1544     Visit Number 7    Date for PT Re-Evaluation 11/15/23    PT Start Time 1540    PT Stop Time 1620    PT Time Calculation (min) 40 min    Activity Tolerance Patient tolerated treatment well    Behavior During Therapy [facility] for tasks assessed/performed             Past Medical History:  Diagnosis Date   Cancer determined by prostate biopsy (HCC)    Depression    Hypercholesterolemia    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   PTSD (post-traumatic stress disorder)    Past Surgical History:  Procedure Laterality Date   PROSTATE SURGERY     Patient Active Problem List   Diagnosis Date Noted   CAP (community acquired pneumonia) 05/04/2023   Acute metabolic encephalopathy 05/04/2023   Essential hypertension 05/04/2023   Unsteady gait 12/21/2016   Aseptic meningitis 12/08/2016   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   Acute renal failure superimposed on stage 3b chronic kidney disease (HCC) 12/08/2016   History of prostate cancer    Benign prostatic hyperplasia    PTSD (post-traumatic stress disorder)    Stage 3 chronic kidney disease (HCC)    Acute blood loss anemia    Acute encephalopathy    Hypophosphatemia    Seizure (HCC) 12/01/2016   Hyperglycemia 12/01/2016   Depression 12/01/2016   Prostate cancer (HCC) 12/01/2016   HLD (hyperlipidemia) 12/01/2016   Insomnia 12/01/2016    PCP: [name], MD  REFERRING PROVIDER: [name], MD   REFERRING DIAG: R53.1 (ICD-10-CM) [name]   THERAPY DIAG:  Difficulty in walking, not elsewhere classified  Muscle weakness (generalized)  Unsteadiness on feet  Chronic midline low back pain with left-sided sciatica  Rationale for Evaluation and  Treatment: Rehabilitation  ONSET DATE: 07/14/23  SUBJECTIVE:   SUBJECTIVE STATEMENT: Patient reports continued pain in hs, mild, worse in the morning when stiff and improves with moving around.   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 Yes: NPRS scale: 4/10 Pain location: low back and hip Pain description: aching Aggravating factors: not every day. Relieving factors: moving around  PRECAUTIONS: Fall  RED FLAGS: None   WEIGHT BEARING RESTRICTIONS: No  FALLS:  Has patient fallen in last 6 months? Yes. Number of falls 1,slipped while going out to his mailbox  LIVING ENVIRONMENT: Lives with: lives with their family Lives in: House/apartment Stairs: Yes: External: 2 steps; none Has following equipment at home: None  OCCUPATION: N/A, Patient reports he is very sedentary at home.  PLOF: Independent  PATIENT GOALS: Decrease pain  NEXT MD VISIT: unknown  OBJECTIVE:  Note: Objective measures were completed at Evaluation unless otherwise noted.  DIAGNOSTIC FINDINGS: X rays at the time of his fall were all negative for fractures in pelvis, hips, knees  COGNITION: Overall cognitive status: Within functional limits for tasks assessed     SENSATION: Patient reports inconsistent tingling in toes and also in L hip  EDEMA:  denies  MUSCLE LENGTH:  Hamstrings: Right 70 deg; Left 55 deg Thomas test: Right WFL deg; Left WFL deg  POSTURE: No Significant postural limitations  PALPATION: Mild and intermittent TTP along L L4-L5 paraspinals  LUMBAR ROM: flex to mid shin, ext WNL, lateral side bend to mid thigh B, rotation to R 50%, L WNL  LOWER EXTREMITY ROM: R WNL, L hip generally mildly limited  LOWER EXTREMITY MMT: BLE 5/5 throughout, but  demonstrates poor motor coordination and control, weak trunk  LUMBAR SPECIAL TESTS; slr AND LUMBAR COMPRESSION NEGATIVE.  LOWER EXTREMITY SPECIAL TESTS:  Hip special tests: Hip scouring test: negative  FUNCTIONAL TESTS:  5 times sit to stand: 15.13 Timed up and go (TUG): 14.42 Functional gait assessment: TBD  GAIT: Distance walked: In clinic distances Assistive device utilized: None Level of assistance: Complete Independence Comments: Mildly unsteady, shortened step length B, slow   TODAY'S TREATMENT:                                                                                                                              DATE:  10/04/23 NuStep L5 x 6 minutes Seated lumbar mobilization, pelvic tilts, then weight shifts in rotation to each side. He required mod TC and VC to isolate lower trunk. Improved with repetition. Ambulation outdoors on unlevel surfaces, slightly increased speed. Good form with no foot drag, x 600'. Standing shoulder ext and row, 10#, 2 x 10 reps Standing shoulder ER, Gtband, 2 x 10 reps Standing anti rotation exercise for trunk stability 10#, 2 x 10 each side Seated knee flex, 25#, BLE, 2 x 10 reps Seated knee ext, 5#, BLE, 2 x 10 reps  09/27/23 Bike L3 x76mins HS stretch 30s  Feet on pball K2C, bridges, rotations x10 STS holding yellow ball 2x10  Walking on beam On airex cone taps Step ups 6" Seated row and lat pull down 20# 2x10    09/22/23 NuStep L5 x 6 minutes 5x STS, see goals Ambulation x 100' for gait assessment. Patient overall lower toned, slow, slightly drags feet. However, when he increased his speed to a more normal pace, his B foot clearance improved with increased arm swing, and slightly taller posture maintained. Performed balance training on floor mat, performing B side stepping, then progressing to alternately tapping a cone on the floor with each foot and side stepping in each direction. X2 each way, CGA, no LOB, but unsteadiness  noted. Repeated activity while holding 4# ball and adding chest press at each cone to engage upper body. Improved knee lift noted at each cone, possibly due to more engaged trunk. Standing step taps in all directions upon therapist command, returning to center each time. Increased speed of movement, all completed with CGA, no unsteadiness. Alternating step taps on 4" step, no UE support, increasing speed of movement to quick alternating taps, no LOB, able to clear his toes with each tap, approximately 30 times each leg.  09/20/23 NuStep L5 x 6 minutes Supine  piriformis stretch B, muscle palpation to L hip an gluts, no TTP. SI screen performed on L due to reports of intermittent pain in L hip in area of SI- negative. Supine trunk and LE strength- bridge, clamshells with G tband resistance Seated HS stretch 3 x 15 sec to each leg Sit to stand x 10 B side to side step with G tband at knees x 10 each way, 1 episode of LOB Standing shoulder ext, rows, ER, Red Tband, 10 reps  09/16/23 Bike L2 x45mins  STS 2x10 Leg press 20# 2x10 Straight arm pulls 5# 2x10 AR press 5# 2x10 Bridges 2x10 Trunk rotations x10 Passive stretching for LE's  blackTB extension 2x10  09/14/23 Nustep level 4 x 5 minutes 20# leg curls 2x10 5# straight arm pulls 5# leg extension On airex in pbars ball toss Ball kicks  Feet on ball K2C, rotation, bridges and isometric abs Passive stretch LE's  09/06/23 Education    PATIENT EDUCATION:  Education details: POC Person educated: Patient Education method: Explanation Education comprehension: verbalized understanding  HOME EXERCISE PROGRAM: Access Code: [email or web address] URL: [email or web address] Date: 09/16/2023 Prepared by: Cassie Freer  Exercises - Supine Bridge  - 1 x daily - 7 x weekly - 2 sets - 10 reps - Supine Lower Trunk Rotation  - 1 x daily - 7 x weekly - 2 sets - 10 reps - Supine Double Knee to Chest Modified  - 1 x daily - 7 x weekly - 2  reps - 30 hold - Supine Single Knee to Chest Stretch  - 1 x daily - 7 x weekly - 2 reps - 30 hold - Sit to Stand with Arms Crossed  - 1 x daily - 7 x weekly - 2 sets - 10 reps  ASSESSMENT:  CLINICAL IMPRESSION: Patient reports continued mild pulling in his hamstring today. Treatment started with some mobilization activities for his low back and HS. Then moved to strengthening and stability exercises as well as some outdoor ambulation to facilitate trunk stability, balance. He tolerated all activities with no C/O pain.  OBJECTIVE IMPAIRMENTS: Abnormal gait, decreased activity tolerance, decreased balance, decreased coordination, difficulty walking, decreased ROM, decreased strength, and pain.   ACTIVITY LIMITATIONS: standing, squatting, stairs, and locomotion level  PARTICIPATION LIMITATIONS: meal prep, cleaning, laundry, driving, shopping, and community activity  PERSONAL FACTORS: Past/current experiences are also affecting patient's functional outcome.   REHAB POTENTIAL: Good  CLINICAL DECISION MAKING: Stable/uncomplicated  EVALUATION COMPLEXITY: Low   GOALS: Goals reviewed with patient? Yes  SHORT TERM GOALS: Target date: 09/23/23 I with initial HEP Baseline: Goal status: 09/20/23-Provided with initial HEP and performing, met  LONG TERM GOALS: Target date: 11/15/23  I with Final HEP Baseline:  Goal status: INITIAL  2.  Decrease 5x STS to < 12 sec with no pushing off mat, no leaning back Baseline:  Goal status: 09/22/23-16 sec, appeared to fatigue slightly toward end with posterior lean.  3.  Patient will ambulate x at least 500' on level and unlevel surfaces MI with LRAD, no reports of pain Baseline:  Goal status: 10/04/23- 600', unlevel surfaces, S, ongoing  4.  Patient will score at least 24 on FGA Baseline:  Goal status: INITIAL  5.  Patient will report no episodes of back or hip pain x at least 3 weeks in a row. Baseline:  Goal status: 09/22/23-Reports 5/10  pain in L low back and hip. Worse first thing in the morning. PLAN:  PT FREQUENCY: 1-2x/week  PT DURATION: 10 weeks  PLANNED INTERVENTIONS: 97110-Therapeutic exercises, 97530- Therapeutic activity, [id number]- Neuromuscular re-education, 97535- Self Care, 97140- Manual therapy, [id number]- Gait training, 97014- Electrical stimulation (unattended), [phone]- Traction (mechanical), [id number]- Ionotophoresis 4mg /ml Dexamethasone, Patient/Family education, Balance training, Dry Needling, Cryotherapy, Moist heat, and 97146- PT Re-evaluation  PLAN FOR NEXT SESSION: trunk stability, hip stretch, balance  Oley Balm DPT 10/04/23 4:21 PM

## 2023-10-08 NOTE — Therapy (Signed)
OUTPATIENT PHYSICAL THERAPY LOWER EXTREMITY TREATMENT   Patient Name: Lance Todd MRN: [id number] DOB:10-07-1941, 82 y.o., male Today's Date: 10/11/2023  END OF SESSION:  PT End of Session - 10/11/23 1359     Visit Number 8    Date for PT Re-Evaluation 11/15/23    PT Start Time 1400    PT Stop Time 1445    PT Time Calculation (min) 45 min    Activity Tolerance Patient tolerated treatment well    Behavior During Therapy [facility] for tasks assessed/performed              Past Medical History:  Diagnosis Date   Cancer determined by prostate biopsy (HCC)    Depression    Hypercholesterolemia    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   PTSD (post-traumatic stress disorder)    Past Surgical History:  Procedure Laterality Date   PROSTATE SURGERY     Patient Active Problem List   Diagnosis Date Noted   CAP (community acquired pneumonia) 05/04/2023   Acute metabolic encephalopathy 05/04/2023   Essential hypertension 05/04/2023   Unsteady gait 12/21/2016   Aseptic meningitis 12/08/2016   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   Acute renal failure superimposed on stage 3b chronic kidney disease (HCC) 12/08/2016   History of prostate cancer    Benign prostatic hyperplasia    PTSD (post-traumatic stress disorder)    Stage 3 chronic kidney disease (HCC)    Acute blood loss anemia    Acute encephalopathy    Hypophosphatemia    Seizure (HCC) 12/01/2016   Hyperglycemia 12/01/2016   Depression 12/01/2016   Prostate cancer (HCC) 12/01/2016   HLD (hyperlipidemia) 12/01/2016   Insomnia 12/01/2016    PCP: [name], MD  REFERRING PROVIDER: [name], MD   REFERRING DIAG: R53.1 (ICD-10-CM) [name]   THERAPY DIAG:  Difficulty in walking, not elsewhere classified  Muscle weakness (generalized)  Unsteadiness on feet  Chronic midline low back pain with left-sided sciatica  Rationale for Evaluation and  Treatment: Rehabilitation  ONSET DATE: 07/14/23  SUBJECTIVE:   SUBJECTIVE STATEMENT: Doing alright, not really hurting   PERTINENT HISTORY:   Prostate cancer (HCC)   HLD (hyperlipidemia)   PTSD (post-traumatic stress disorder)   Acute 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 Yes: NPRS scale: 4/10 Pain location: low back and hip Pain description: aching Aggravating factors: not every day. Relieving factors: moving around  PRECAUTIONS: Fall  RED FLAGS: None   WEIGHT BEARING RESTRICTIONS: No  FALLS:  Has patient fallen in last 6 months? Yes. Number of falls 1,slipped while going out to his mailbox  LIVING ENVIRONMENT: Lives with: lives with their family Lives in: House/apartment Stairs: Yes: External: 2 steps; none Has following equipment at home: None  OCCUPATION: N/A, Patient reports he is very sedentary at home.  PLOF: Independent  PATIENT GOALS: Decrease pain  NEXT MD VISIT: unknown  OBJECTIVE:  Note: Objective measures were completed at Evaluation unless otherwise noted.  DIAGNOSTIC FINDINGS: X rays at the time of his fall were all negative for fractures in pelvis, hips, knees  COGNITION: Overall cognitive status: Within functional limits for tasks assessed     SENSATION: Patient reports inconsistent tingling in toes and also in L hip  EDEMA:  denies  MUSCLE LENGTH: Hamstrings: Right 70 deg; Left 55 deg Thomas test: Right WFL deg;  Left WFL deg  POSTURE: No Significant postural limitations  PALPATION: Mild and intermittent TTP along L L4-L5 paraspinals  LUMBAR ROM: flex to mid shin, ext WNL, lateral side bend to mid thigh B, rotation to R 50%, L WNL  LOWER EXTREMITY ROM: R WNL, L hip generally mildly limited  LOWER EXTREMITY MMT: BLE 5/5 throughout, but demonstrates poor motor coordination and control, weak trunk  LUMBAR SPECIAL  TESTS; slr AND LUMBAR COMPRESSION NEGATIVE.  LOWER EXTREMITY SPECIAL TESTS:  Hip special tests: Hip scouring test: negative  FUNCTIONAL TESTS:  5 times sit to stand: 15.13 Timed up and go (TUG): 14.42 Functional gait assessment: TBD  GAIT: Distance walked: In clinic distances Assistive device utilized: None Level of assistance: Complete Independence Comments: Mildly unsteady, shortened step length B, slow   TODAY'S TREATMENT:                                                                                                                              DATE:  10/11/23 NuStep L5 x56mins  Walk outdoors around back building  Resisted gait 20# 3 way x3 CGA On airex taps on 6" CGA Straight arm pulls 5# 2x10 STS on airex 2x10   10/04/23 NuStep L5 x 6 minutes Seated lumbar mobilization, pelvic tilts, then weight shifts in rotation to each side. He required mod TC and VC to isolate lower trunk. Improved with repetition. Ambulation outdoors on unlevel surfaces, slightly increased speed. Good form with no foot drag, x 600'. Standing shoulder ext and row, 10#, 2 x 10 reps Standing shoulder ER, Gtband, 2 x 10 reps Standing anti rotation exercise for trunk stability 10#, 2 x 10 each side Seated knee flex, 25#, BLE, 2 x 10 reps Seated knee ext, 5#, BLE, 2 x 10 reps  09/27/23 Bike L3 x22mins HS stretch 30s  Feet on pball K2C, bridges, rotations x10 STS holding yellow ball 2x10  Walking on beam On airex cone taps Step ups 6" Seated row and lat pull down 20# 2x10    09/22/23 NuStep L5 x 6 minutes 5x STS, see goals Ambulation x 100' for gait assessment. Patient overall lower toned, slow, slightly drags feet. However, when he increased his speed to a more normal pace, his B foot clearance improved with increased arm swing, and slightly taller posture maintained. Performed balance training on floor mat, performing B side stepping, then progressing to alternately tapping a cone on the floor  with each foot and side stepping in each direction. X2 each way, CGA, no LOB, but unsteadiness noted. Repeated activity while holding 4# ball and adding chest press at each cone to engage upper body. Improved knee lift noted at each cone, possibly due to more engaged trunk. Standing step taps in all directions upon therapist command, returning to center each time. Increased speed of movement, all completed with CGA, no unsteadiness. Alternating step taps on 4" step, no UE support, increasing speed of movement to quick alternating taps,  no LOB, able to clear his toes with each tap, approximately 30 times each leg.  09/20/23 NuStep L5 x 6 minutes Supine piriformis stretch B, muscle palpation to L hip an gluts, no TTP. SI screen performed on L due to reports of intermittent pain in L hip in area of SI- negative. Supine trunk and LE strength- bridge, clamshells with G tband resistance Seated HS stretch 3 x 15 sec to each leg Sit to stand x 10 B side to side step with G tband at knees x 10 each way, 1 episode of LOB Standing shoulder ext, rows, ER, Red Tband, 10 reps  09/16/23 Bike L2 x96mins  STS 2x10 Leg press 20# 2x10 Straight arm pulls 5# 2x10 AR press 5# 2x10 Bridges 2x10 Trunk rotations x10 Passive stretching for LE's  blackTB extension 2x10  09/14/23 Nustep level 4 x 5 minutes 20# leg curls 2x10 5# straight arm pulls 5# leg extension On airex in pbars ball toss Ball kicks  Feet on ball K2C, rotation, bridges and isometric abs Passive stretch LE's  09/06/23 Education    PATIENT EDUCATION:  Education details: POC Person educated: Patient Education method: Explanation Education comprehension: verbalized understanding  HOME EXERCISE PROGRAM: Access Code: [email or web address] URL: [email or web address] Date: 09/16/2023 Prepared by: Cassie Freer  Exercises - Supine Bridge  - 1 x daily - 7 x weekly - 2 sets - 10 reps - Supine Lower Trunk Rotation  - 1 x daily - 7 x  weekly - 2 sets - 10 reps - Supine Double Knee to Chest Modified  - 1 x daily - 7 x weekly - 2 reps - 30 hold - Supine Single Knee to Chest Stretch  - 1 x daily - 7 x weekly - 2 reps - 30 hold - Sit to Stand with Arms Crossed  - 1 x daily - 7 x weekly - 2 sets - 10 reps  ASSESSMENT:  CLINICAL IMPRESSION: Patient reports doing well, no pain today. Treatment focused on more endurance, stability, and balance. Some outdoor ambulation, he fatigues with up hill and has increased dragging of his feet. Some difficulty and LOB with resisted gait. Needs cues to slow down when standing on airex, because he tends to move too quick and compromise his balance. Posterior lean and weight shift on to heels with STS on airex, some LOB which he controls with legs against mat table. Overall he tolerated all activities well with no C/O pain.  OBJECTIVE IMPAIRMENTS: Abnormal gait, decreased activity tolerance, decreased balance, decreased coordination, difficulty walking, decreased ROM, decreased strength, and pain.   ACTIVITY LIMITATIONS: standing, squatting, stairs, and locomotion level  PARTICIPATION LIMITATIONS: meal prep, cleaning, laundry, driving, shopping, and community activity  PERSONAL FACTORS: Past/current experiences are also affecting patient's functional outcome.   REHAB POTENTIAL: Good  CLINICAL DECISION MAKING: Stable/uncomplicated  EVALUATION COMPLEXITY: Low   GOALS: Goals reviewed with patient? Yes  SHORT TERM GOALS: Target date: 09/23/23 I with initial HEP Baseline: Goal status: 09/20/23-Provided with initial HEP and performing, met  LONG TERM GOALS: Target date: 11/15/23  I with Final HEP Baseline:  Goal status: INITIAL  2.  Decrease 5x STS to < 12 sec with no pushing off mat, no leaning back Baseline:  Goal status: 09/22/23-16 sec, appeared to fatigue slightly toward end with posterior lean.  3.  Patient will ambulate x at least 500' on level and unlevel surfaces MI with  LRAD, no reports of pain Baseline:  Goal status: 10/04/23- 600', unlevel surfaces, S, ongoing  4.  Patient will score at least 24 on FGA Baseline:  Goal status: INITIAL  5.  Patient will report no episodes of back or hip pain x at least 3 weeks in a row. Baseline:  Goal status: 09/22/23-Reports 5/10 pain in L low back and hip. Worse first thing in the morning. PLAN:  PT FREQUENCY: 1-2x/week  PT DURATION: 10 weeks  PLANNED INTERVENTIONS: 97110-Therapeutic exercises, 97530- Therapeutic activity, [id number]- Neuromuscular re-education, 97535- Self Care, 47829- Manual therapy, [id number]- Gait training, 97014- Electrical stimulation (unattended), [id number]- Traction (mechanical), [id number]- Ionotophoresis 4mg /ml Dexamethasone, Patient/Family education, Balance training, Dry Needling, Cryotherapy, Moist heat, and 97146- PT Re-evaluation  PLAN FOR NEXT SESSION: trunk stability, hip stretch, balance  Cassie Freer DPT 10/11/23 2:41 PM

## 2023-10-11 ENCOUNTER — Ambulatory Visit: Payer: Medicare PPO

## 2023-10-11 DIAGNOSIS — M6281 Muscle weakness (generalized): Secondary | ICD-10-CM

## 2023-10-11 DIAGNOSIS — R262 Difficulty in walking, not elsewhere classified: Secondary | ICD-10-CM

## 2023-10-11 DIAGNOSIS — R2681 Unsteadiness on feet: Secondary | ICD-10-CM

## 2023-10-11 DIAGNOSIS — G8929 Other chronic pain: Secondary | ICD-10-CM

## 2023-10-11 DIAGNOSIS — M5442 Lumbago with sciatica, left side: Secondary | ICD-10-CM | POA: Diagnosis not present

## 2023-10-18 ENCOUNTER — Ambulatory Visit: Payer: Medicare PPO

## 2023-10-25 ENCOUNTER — Encounter: Payer: Self-pay | Admitting: Physical Therapy

## 2023-10-25 ENCOUNTER — Ambulatory Visit: Payer: Medicare PPO | Attending: Internal Medicine | Admitting: Physical Therapy

## 2023-10-25 ENCOUNTER — Ambulatory Visit: Payer: Medicare PPO | Admitting: Physical Therapy

## 2023-10-25 DIAGNOSIS — R262 Difficulty in walking, not elsewhere classified: Secondary | ICD-10-CM

## 2023-10-25 DIAGNOSIS — M5442 Lumbago with sciatica, left side: Secondary | ICD-10-CM | POA: Diagnosis not present

## 2023-10-25 DIAGNOSIS — M6281 Muscle weakness (generalized): Secondary | ICD-10-CM

## 2023-10-25 DIAGNOSIS — R2681 Unsteadiness on feet: Secondary | ICD-10-CM | POA: Diagnosis not present

## 2023-10-25 DIAGNOSIS — G8929 Other chronic pain: Secondary | ICD-10-CM | POA: Diagnosis not present

## 2023-10-25 NOTE — Therapy (Signed)
OUTPATIENT PHYSICAL THERAPY LOWER EXTREMITY TREATMENT   Patient Name: Lance Todd MRN: [id number] DOB:1941-10-02, 82 y.o., male Today's Date: 10/25/2023  END OF SESSION:  PT End of Session - 10/25/23 1526     Visit Number 9    Date for PT Re-Evaluation 11/15/23    PT Start Time 1523    PT Stop Time 1611    PT Time Calculation (min) 48 min    Activity Tolerance Patient tolerated treatment well    Behavior During Therapy [facility] for tasks assessed/performed              Past Medical History:  Diagnosis Date   Cancer determined by prostate biopsy (HCC)    Depression    Hypercholesterolemia    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   PTSD (post-traumatic stress disorder)    Past Surgical History:  Procedure Laterality Date   PROSTATE SURGERY     Patient Active Problem List   Diagnosis Date Noted   CAP (community acquired pneumonia) 05/04/2023   Acute metabolic encephalopathy 05/04/2023   Essential hypertension 05/04/2023   Unsteady gait 12/21/2016   Aseptic meningitis 12/08/2016   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   Acute renal failure superimposed on stage 3b chronic kidney disease (HCC) 12/08/2016   History of prostate cancer    Benign prostatic hyperplasia    PTSD (post-traumatic stress disorder)    Stage 3 chronic kidney disease (HCC)    Acute blood loss anemia    Acute encephalopathy    Hypophosphatemia    Seizure (HCC) 12/01/2016   Hyperglycemia 12/01/2016   Depression 12/01/2016   Prostate cancer (HCC) 12/01/2016   HLD (hyperlipidemia) 12/01/2016   Insomnia 12/01/2016    PCP: [name], MD  REFERRING PROVIDER: [name], MD   REFERRING DIAG: R53.1 (ICD-10-CM) [name]   THERAPY DIAG:  Difficulty in walking, not elsewhere classified  Muscle weakness (generalized)  Unsteadiness on feet  Rationale for Evaluation and Treatment: Rehabilitation  ONSET DATE: 07/14/23  SUBJECTIVE:    SUBJECTIVE STATEMENT: No problems, no falls, has had some mix ups with appointment days and times  PERTINENT HISTORY:   Prostate cancer (HCC)   HLD (hyperlipidemia)   PTSD (post-traumatic stress disorder)   Acute 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 Yes: NPRS scale: 3/10 Pain location: low back and hip Pain description: aching Aggravating factors: not every day. Relieving factors: moving around  PRECAUTIONS: Fall  RED FLAGS: None   WEIGHT BEARING RESTRICTIONS: No  FALLS:  Has patient fallen in last 6 months? Yes. Number of falls 1,slipped while going out to his mailbox  LIVING ENVIRONMENT: Lives with: lives with their family Lives in: House/apartment Stairs: Yes: External: 2 steps; none Has following equipment at home: None  OCCUPATION: N/A, Patient reports he is very sedentary at home.  PLOF: Independent  PATIENT GOALS: Decrease pain  NEXT MD VISIT: unknown  OBJECTIVE:  Note: Objective measures were completed at Evaluation unless otherwise noted.  DIAGNOSTIC FINDINGS: X rays at the time of his fall were all negative for fractures in pelvis, hips, knees  COGNITION: Overall cognitive status: Within functional limits for tasks assessed     SENSATION: Patient reports inconsistent tingling in toes and also in L hip  EDEMA:  denies  MUSCLE LENGTH: Hamstrings: Right 70 deg; Left 55 deg Thomas test: Right WFL deg; Left  WFL deg  POSTURE: No Significant postural limitations  PALPATION: Mild and intermittent TTP along L L4-L5 paraspinals  LUMBAR ROM: flex to mid shin, ext WNL, lateral side bend to mid thigh B, rotation to R 50%, L WNL  LOWER EXTREMITY ROM: R WNL, L hip generally mildly limited  LOWER EXTREMITY MMT: BLE 5/5 throughout, but demonstrates poor motor coordination and control, weak trunk  LUMBAR SPECIAL TESTS; slr AND LUMBAR  COMPRESSION NEGATIVE.  LOWER EXTREMITY SPECIAL TESTS:  Hip special tests: Hip scouring test: negative  FUNCTIONAL TESTS:  5 times sit to stand: 15.13 Timed up and go (TUG): 14.42 Functional gait assessment: TBD  GAIT: Distance walked: In clinic distances Assistive device utilized: None Level of assistance: Complete Independence Comments: Mildly unsteady, shortened step length B, slow   TODAY'S TREATMENT:                                                                                                                              DATE:  10/25/23 Nustep level 5 x 6 minutes Leg curls 25# 3x10 Leg extension 5# 2x10 5# straight arm pulls 5# hip extension  5# hip abduction In pbars step turn reach Alternating 12" toe touches Walking ball toss Feet on ball K2C, rotation, bridge. Isometric abs Passive stretch LE's  10/11/23 NuStep L5 x49mins  Walk outdoors around back building  Resisted gait 20# 3 way x3 CGA On airex taps on 6" CGA Straight arm pulls 5# 2x10 STS on airex 2x10  10/04/23 NuStep L5 x 6 minutes Seated lumbar mobilization, pelvic tilts, then weight shifts in rotation to each side. He required mod TC and VC to isolate lower trunk. Improved with repetition. Ambulation outdoors on unlevel surfaces, slightly increased speed. Good form with no foot drag, x 600'. Standing shoulder ext and row, 10#, 2 x 10 reps Standing shoulder ER, Gtband, 2 x 10 reps Standing anti rotation exercise for trunk stability 10#, 2 x 10 each side Seated knee flex, 25#, BLE, 2 x 10 reps Seated knee ext, 5#, BLE, 2 x 10 reps  09/27/23 Bike L3 x63mins HS stretch 30s  Feet on pball K2C, bridges, rotations x10 STS holding yellow ball 2x10  Walking on beam On airex cone taps Step ups 6" Seated row and lat pull down 20# 2x10    09/22/23 NuStep L5 x 6 minutes 5x STS, see goals Ambulation x 100' for gait assessment. Patient overall lower toned, slow, slightly drags feet. However, when he  increased his speed to a more normal pace, his B foot clearance improved with increased arm swing, and slightly taller posture maintained. Performed balance training on floor mat, performing B side stepping, then progressing to alternately tapping a cone on the floor with each foot and side stepping in each direction. X2 each way, CGA, no LOB, but unsteadiness noted. Repeated activity while holding 4# ball and adding chest press at each cone to engage upper body. Improved knee lift noted at each  cone, possibly due to more engaged trunk. Standing step taps in all directions upon therapist command, returning to center each time. Increased speed of movement, all completed with CGA, no unsteadiness. Alternating step taps on 4" step, no UE support, increasing speed of movement to quick alternating taps, no LOB, able to clear his toes with each tap, approximately 30 times each leg.  09/20/23 NuStep L5 x 6 minutes Supine piriformis stretch B, muscle palpation to L hip an gluts, no TTP. SI screen performed on L due to reports of intermittent pain in L hip in area of SI- negative. Supine trunk and LE strength- bridge, clamshells with G tband resistance Seated HS stretch 3 x 15 sec to each leg Sit to stand x 10 B side to side step with G tband at knees x 10 each way, 1 episode of LOB Standing shoulder ext, rows, ER, Red Tband, 10 reps  09/16/23 Bike L2 x10mins  STS 2x10 Leg press 20# 2x10 Straight arm pulls 5# 2x10 AR press 5# 2x10 Bridges 2x10 Trunk rotations x10 Passive stretching for LE's  blackTB extension 2x10  09/14/23 Nustep level 4 x 5 minutes 20# leg curls 2x10 5# straight arm pulls 5# leg extension On airex in pbars ball toss Ball kicks  Feet on ball K2C, rotation, bridges and isometric abs Passive stretch LE's  09/06/23 Education    PATIENT EDUCATION:  Education details: POC Person educated: Patient Education method: Explanation Education comprehension: verbalized  understanding  HOME EXERCISE PROGRAM: Access Code: [email or web address] URL: [email or web address] Date: 09/16/2023 Prepared by: Cassie Freer  Exercises - Supine Bridge  - 1 x daily - 7 x weekly - 2 sets - 10 reps - Supine Lower Trunk Rotation  - 1 x daily - 7 x weekly - 2 sets - 10 reps - Supine Double Knee to Chest Modified  - 1 x daily - 7 x weekly - 2 reps - 30 hold - Supine Single Knee to Chest Stretch  - 1 x daily - 7 x weekly - 2 reps - 30 hold - Sit to Stand with Arms Crossed  - 1 x daily - 7 x weekly - 2 sets - 10 reps  ASSESSMENT:  CLINICAL IMPRESSION: Patient reports doing well, no pain today. Continued to work on balance, core stability and overall strength, did well today, just really struggles on the dynamic surfaces  OBJECTIVE IMPAIRMENTS: Abnormal gait, decreased activity tolerance, decreased balance, decreased coordination, difficulty walking, decreased ROM, decreased strength, and pain.   ACTIVITY LIMITATIONS: standing, squatting, stairs, and locomotion level  PARTICIPATION LIMITATIONS: meal prep, cleaning, laundry, driving, shopping, and community activity  PERSONAL FACTORS: Past/current experiences are also affecting patient's functional outcome.   REHAB POTENTIAL: Good  CLINICAL DECISION MAKING: Stable/uncomplicated  EVALUATION COMPLEXITY: Low   GOALS: Goals reviewed with patient? Yes  SHORT TERM GOALS: Target date: 09/23/23 I with initial HEP Baseline: Goal status: 09/20/23-Provided with initial HEP and performing, met  LONG TERM GOALS: Target date: 11/15/23  I with Final HEP Baseline:  Goal status: ongoing 10/25/23  2.  Decrease 5x STS to < 12 sec with no pushing off mat, no leaning back Baseline:  Goal status: 09/22/23-16 sec, appeared to fatigue slightly toward end with posterior lean.  3.  Patient will ambulate x at least 500' on level and unlevel surfaces MI with LRAD, no reports of pain Baseline:  Goal status: 10/04/23- 600', unlevel  surfaces, S, ongoing  4.  Patient will score at least 24 on FGA Baseline:  Goal status:  Progressing 10/25/23  5.  Patient will report no episodes of back or hip pain x at least 3 weeks in a row. Baseline:  Goal status: 09/22/23-Reports 5/10 pain in L low back and hip. Worse first thing in the morning. PLAN:  PT FREQUENCY: 1-2x/week  PT DURATION: 10 weeks  PLANNED INTERVENTIONS: 97110-Therapeutic exercises, 97530- Therapeutic activity, [id number]- Neuromuscular re-education, 97535- Self Care, 65784- Manual therapy, [id number]- Gait training, 97014- Electrical stimulation (unattended), [id number]- Traction (mechanical), [id number]- Ionotophoresis 4mg /ml Dexamethasone, Patient/Family education, Balance training, Dry Needling, Cryotherapy, Moist heat, and 97146- PT Re-evaluation  PLAN FOR NEXT SESSION:  continue to challenge strength and balance  Stacie Glaze, PT 10/25/23 3:27 PM

## 2023-10-29 NOTE — Therapy (Incomplete)
OUTPATIENT PHYSICAL THERAPY LOWER EXTREMITY TREATMENT   Patient Name: Lance Todd MRN: [id number] DOB:Nov 19, 1941, 82 y.o., male Today's Date: 10/29/2023  END OF SESSION:     Past Medical History:  Diagnosis Date   Cancer determined by prostate biopsy (HCC)    Depression    Hypercholesterolemia    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   PTSD (post-traumatic stress disorder)    Past Surgical History:  Procedure Laterality Date   PROSTATE SURGERY     Patient Active Problem List   Diagnosis Date Noted   CAP (community acquired pneumonia) 05/04/2023   Acute metabolic encephalopathy 05/04/2023   Essential hypertension 05/04/2023   Unsteady gait 12/21/2016   Aseptic meningitis 12/08/2016   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   Acute renal failure superimposed on stage 3b chronic kidney disease (HCC) 12/08/2016   History of prostate cancer    Benign prostatic hyperplasia    PTSD (post-traumatic stress disorder)    Stage 3 chronic kidney disease (HCC)    Acute blood loss anemia    Acute encephalopathy    Hypophosphatemia    Seizure (HCC) 12/01/2016   Hyperglycemia 12/01/2016   Depression 12/01/2016   Prostate cancer (HCC) 12/01/2016   HLD (hyperlipidemia) 12/01/2016   Insomnia 12/01/2016    PCP: [name], MD  REFERRING PROVIDER: [name], MD   REFERRING DIAG: R53.1 (ICD-10-CM) [name]   THERAPY DIAG:  No diagnosis found.  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 Yes: NPRS scale: 4/10 Pain location: low back and  hip Pain description: aching Aggravating factors: not every day. Relieving factors: moving around  PRECAUTIONS: Fall  RED FLAGS: None   WEIGHT BEARING RESTRICTIONS: No  FALLS:  Has patient fallen in last 6 months? Yes. Number of falls 1,slipped while going out to his mailbox  LIVING ENVIRONMENT: Lives with: lives with their family Lives in: House/apartment Stairs: Yes: External: 2 steps; none Has following equipment at home: None  OCCUPATION: N/A, Patient reports he is very sedentary at home.  PLOF: Independent  PATIENT GOALS: Decrease pain  NEXT MD VISIT: unknown  OBJECTIVE:  Note: Objective measures were completed at Evaluation unless otherwise noted.  DIAGNOSTIC FINDINGS: X rays at the time of his fall were all negative for fractures in pelvis, hips, knees  COGNITION: Overall cognitive status: Within functional limits for tasks assessed     SENSATION: Patient reports inconsistent tingling in toes and also in L hip  EDEMA:  denies  MUSCLE LENGTH: Hamstrings: Right 70 deg; Left 55 deg Thomas test: Right WFL deg; Left WFL deg  POSTURE: No Significant postural limitations  PALPATION: Mild and intermittent TTP along L L4-L5 paraspinals  LUMBAR ROM: flex to mid shin, ext WNL, lateral side bend to mid thigh B, rotation to R 50%, L WNL  LOWER EXTREMITY ROM: R WNL, L hip generally mildly limited  LOWER EXTREMITY MMT: BLE 5/5 throughout, but demonstrates poor motor coordination and control, weak trunk  LUMBAR SPECIAL TESTS; slr AND LUMBAR COMPRESSION NEGATIVE.  LOWER EXTREMITY SPECIAL TESTS:  Hip special tests: Hip scouring test: negative  FUNCTIONAL TESTS:  5 times sit to stand: 15.13 Timed up and go (TUG): 14.42 Functional gait assessment: TBD  GAIT: Distance walked: In clinic distances Assistive device utilized: None Level of assistance: Complete Independence Comments: Mildly unsteady, shortened step length B, slow   TODAY'S TREATMENT:                                                                                                                               DATE:  11/01/23 Bike Seated row Lat pull down Leg ext HS curls  Walking on beam  Marching on airex Catch on airex   10/11/23 NuStep L5 x2mins  Walk outdoors around back building  Resisted gait 20# 3 way x3 CGA On airex taps on 6" CGA Straight arm pulls 5# 2x10 STS on airex 2x10   10/04/23 NuStep L5 x 6 minutes Seated lumbar mobilization, pelvic tilts, then weight shifts in rotation to each side. He required mod TC and VC to isolate lower trunk. Improved with repetition. Ambulation outdoors on unlevel surfaces, slightly increased speed. Good form with no foot drag, x 600'. Standing shoulder ext and row, 10#, 2 x 10 reps Standing shoulder ER, Gtband, 2 x 10 reps Standing anti rotation exercise for trunk stability 10#, 2 x 10 each side Seated knee flex, 25#, BLE, 2 x 10 reps Seated knee ext, 5#, BLE, 2 x 10 reps  09/27/23 Bike L3 x48mins HS stretch 30s  Feet on pball K2C, bridges, rotations x10 STS holding yellow ball 2x10  Walking on beam On airex cone taps Step ups 6" Seated row and lat pull down 20# 2x10    09/22/23 NuStep L5 x 6 minutes 5x STS, see goals Ambulation x 100' for gait assessment. Patient overall lower toned, slow, slightly drags feet. However, when he increased his speed to a more normal pace, his B foot clearance improved with increased arm swing, and slightly taller posture maintained. Performed balance training on floor mat, performing B side stepping, then progressing to alternately tapping a cone on the floor with each foot and side stepping in each direction. X2 each way, CGA, no LOB, but unsteadiness noted. Repeated activity while holding 4# ball and adding chest press at each cone to engage upper body. Improved knee lift noted at each cone, possibly due to more engaged trunk. Standing step taps in all directions upon therapist command,  returning to center each time. Increased speed of movement, all completed with CGA, no unsteadiness. Alternating step taps on 4" step, no UE support, increasing speed of movement to quick alternating taps, no LOB, able to clear his toes with each tap, approximately 30 times each leg.  09/20/23 NuStep L5 x 6 minutes Supine piriformis stretch B, muscle palpation to L hip an gluts, no TTP. SI screen performed on L due to reports of intermittent pain in L hip in area of SI- negative. Supine trunk and LE strength- bridge, clamshells with G tband resistance Seated HS stretch 3 x  15 sec to each leg Sit to stand x 10 B side to side step with G tband at knees x 10 each way, 1 episode of LOB Standing shoulder ext, rows, ER, Red Tband, 10 reps  09/16/23 Bike L2 x86mins  STS 2x10 Leg press 20# 2x10 Straight arm pulls 5# 2x10 AR press 5# 2x10 Bridges 2x10 Trunk rotations x10 Passive stretching for LE's  blackTB extension 2x10  09/14/23 Nustep level 4 x 5 minutes 20# leg curls 2x10 5# straight arm pulls 5# leg extension On airex in pbars ball toss Ball kicks  Feet on ball K2C, rotation, bridges and isometric abs Passive stretch LE's  09/06/23 Education    PATIENT EDUCATION:  Education details: POC Person educated: Patient Education method: Explanation Education comprehension: verbalized understanding  HOME EXERCISE PROGRAM: Access Code: [email or web address] URL: [email or web address] Date: 09/16/2023 Prepared by: Cassie Freer  Exercises - Supine Bridge  - 1 x daily - 7 x weekly - 2 sets - 10 reps - Supine Lower Trunk Rotation  - 1 x daily - 7 x weekly - 2 sets - 10 reps - Supine Double Knee to Chest Modified  - 1 x daily - 7 x weekly - 2 reps - 30 hold - Supine Single Knee to Chest Stretch  - 1 x daily - 7 x weekly - 2 reps - 30 hold - Sit to Stand with Arms Crossed  - 1 x daily - 7 x weekly - 2 sets - 10 reps  ASSESSMENT:  CLINICAL IMPRESSION: Patient reports doing well,  no pain today. Treatment focused on more endurance, stability, and balance. Some outdoor ambulation, he fatigues with up hill and has increased dragging of his feet. Some difficulty and LOB with resisted gait. Needs cues to slow down when standing on airex, because he tends to move too quick and compromise his balance. Posterior lean and weight shift on to heels with STS on airex, some LOB which he controls with legs against mat table. Overall he tolerated all activities well with no C/O pain.  OBJECTIVE IMPAIRMENTS: Abnormal gait, decreased activity tolerance, decreased balance, decreased coordination, difficulty walking, decreased ROM, decreased strength, and pain.   ACTIVITY LIMITATIONS: standing, squatting, stairs, and locomotion level  PARTICIPATION LIMITATIONS: meal prep, cleaning, laundry, driving, shopping, and community activity  PERSONAL FACTORS: Past/current experiences are also affecting patient's functional outcome.   REHAB POTENTIAL: Good  CLINICAL DECISION MAKING: Stable/uncomplicated  EVALUATION COMPLEXITY: Low   GOALS: Goals reviewed with patient? Yes  SHORT TERM GOALS: Target date: 09/23/23 I with initial HEP Baseline: Goal status: 09/20/23-Provided with initial HEP and performing, met  LONG TERM GOALS: Target date: 11/15/23  I with Final HEP Baseline:  Goal status: INITIAL  2.  Decrease 5x STS to < 12 sec with no pushing off mat, no leaning back Baseline:  Goal status: 09/22/23-16 sec, appeared to fatigue slightly toward end with posterior lean.  3.  Patient will ambulate x at least 500' on level and unlevel surfaces MI with LRAD, no reports of pain Baseline:  Goal status: 10/04/23- 600', unlevel surfaces, S, ongoing  4.  Patient will score at least 24 on FGA Baseline:  Goal status: INITIAL  5.  Patient will report no episodes of back or hip pain x at least 3 weeks in a row. Baseline:  Goal status: 09/22/23-Reports 5/10 pain in L low back and hip. Worse  first thing in the morning. PLAN:  PT FREQUENCY: 1-2x/week  PT DURATION: 10 weeks  PLANNED INTERVENTIONS: 97110-Therapeutic exercises, 97530-  Therapeutic activity, [id number]- Neuromuscular re-education, [id number]- Self Care, 78469- Manual therapy, [id number]- Gait training, [id number]- Electrical stimulation (unattended), [id number]- Traction (mechanical), [id number]- Ionotophoresis 4mg /ml Dexamethasone, Patient/Family education, Balance training, Dry Needling, Cryotherapy, Moist heat, and 97146- PT Re-evaluation  PLAN FOR NEXT SESSION: trunk stability, hip stretch, balance  Ardean Melroy DPT 10/29/23 8:02 AM

## 2023-11-01 ENCOUNTER — Ambulatory Visit: Payer: Medicare PPO

## 2023-11-10 ENCOUNTER — Ambulatory Visit: Payer: Medicare PPO | Admitting: Physical Therapy

## 2023-11-10 ENCOUNTER — Encounter: Payer: Self-pay | Admitting: Physical Therapy

## 2023-11-10 DIAGNOSIS — M6281 Muscle weakness (generalized): Secondary | ICD-10-CM

## 2023-11-10 DIAGNOSIS — R2681 Unsteadiness on feet: Secondary | ICD-10-CM | POA: Diagnosis not present

## 2023-11-10 DIAGNOSIS — R262 Difficulty in walking, not elsewhere classified: Secondary | ICD-10-CM | POA: Diagnosis not present

## 2023-11-10 DIAGNOSIS — G8929 Other chronic pain: Secondary | ICD-10-CM

## 2023-11-10 DIAGNOSIS — M5442 Lumbago with sciatica, left side: Secondary | ICD-10-CM | POA: Diagnosis not present

## 2023-11-10 NOTE — Therapy (Signed)
OUTPATIENT PHYSICAL THERAPY LOWER EXTREMITY TREATMENT  Progress Note Reporting Period 09/06/23 to 11/10/23  See note below for Objective Data and Assessment of Progress/Goals.     Patient Name: Lance Todd MRN: [id number] DOB:Jul 20, 1941, 82 y.o., male Today's Date: 11/10/2023  END OF SESSION:  PT End of Session - 11/10/23 1636     Visit Number 10    Date for PT Re-Evaluation 11/15/23    PT Start Time 1630    PT Stop Time 1710    PT Time Calculation (min) 40 min    Activity Tolerance Patient tolerated treatment well    Behavior During Therapy [facility] for tasks assessed/performed            Past Medical History:  Diagnosis Date   Cancer determined by prostate biopsy (HCC)    Depression    Hypercholesterolemia    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   PTSD (post-traumatic stress disorder)    Past Surgical History:  Procedure Laterality Date   PROSTATE SURGERY     Patient Active Problem List   Diagnosis Date Noted   CAP (community acquired pneumonia) 05/04/2023   Acute metabolic encephalopathy 05/04/2023   Essential hypertension 05/04/2023   Unsteady gait 12/21/2016   Aseptic meningitis 12/08/2016   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   Acute renal failure superimposed on stage 3b chronic kidney disease (HCC) 12/08/2016   History of prostate cancer    Benign prostatic hyperplasia    PTSD (post-traumatic stress disorder)    Stage 3 chronic kidney disease (HCC)    Acute blood loss anemia    Acute encephalopathy    Hypophosphatemia    Seizure (HCC) 12/01/2016   Hyperglycemia 12/01/2016   Depression 12/01/2016   Prostate cancer (HCC) 12/01/2016   HLD (hyperlipidemia) 12/01/2016   Insomnia 12/01/2016    PCP: [name], MD  REFERRING PROVIDER: [name], MD   REFERRING DIAG: R53.1 (ICD-10-CM) [name]   THERAPY DIAG:  Difficulty in walking, not elsewhere classified  Muscle weakness  (generalized)  Unsteadiness on feet  Chronic midline low back pain with left-sided sciatica  Rationale for Evaluation and Treatment: Rehabilitation  ONSET DATE: 07/14/23  SUBJECTIVE:   SUBJECTIVE STATEMENT: Patient reports no big changes. He has some dizziness when he first stands up, but resolves within a few seconds. Pain is much better.  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 Yes: NPRS scale: 3/10 Pain location: low back and hip Pain description: aching Aggravating factors: not every day. Relieving factors: moving around  PRECAUTIONS: Fall  RED FLAGS: None   WEIGHT BEARING RESTRICTIONS: No  FALLS:  Has patient fallen in last 6 months? Yes. Number of falls 1,slipped while going out to his mailbox  LIVING ENVIRONMENT: Lives with: lives with their family Lives in: House/apartment Stairs: Yes: External: 2 steps; none Has following equipment at home: None  OCCUPATION: N/A, Patient reports he is very sedentary at home.  PLOF: Independent  PATIENT GOALS: Decrease pain  NEXT MD VISIT: unknown  OBJECTIVE:  Note: Objective measures were completed at Evaluation unless otherwise noted.  DIAGNOSTIC FINDINGS: X rays at the time of his fall were all negative for fractures in pelvis, hips, knees  COGNITION: Overall cognitive status: Within functional limits for  tasks assessed     SENSATION: Patient reports inconsistent tingling in toes and also in L hip  EDEMA:  denies  MUSCLE LENGTH: Hamstrings: Right 70 deg; Left 55 deg Thomas test: Right WFL deg; Left WFL deg  POSTURE: No Significant postural limitations  PALPATION: Mild and intermittent TTP along L L4-L5 paraspinals  LUMBAR ROM: flex to mid shin, ext WNL, lateral side bend to  mid thigh B, rotation to R 50%, L WNL  LOWER EXTREMITY ROM: R WNL, L hip generally mildly limited  LOWER EXTREMITY MMT: BLE 5/5 throughout, but demonstrates poor motor coordination and control, weak trunk  LUMBAR SPECIAL TESTS; slr AND LUMBAR COMPRESSION NEGATIVE.  LOWER EXTREMITY SPECIAL TESTS:  Hip special tests: Hip scouring test: negative  FUNCTIONAL TESTS:  5 times sit to stand: 15.13 Timed up and go (TUG): 14.42 Functional gait assessment: TBD  GAIT: Distance walked: In clinic distances Assistive device utilized: None Level of assistance: Complete Independence Comments: Mildly unsteady, shortened step length B, slow   TODAY'S TREATMENT:                                                                                                                              DATE:  11/10/23 NuStep L5 x 5 minutes Functional status re-assessed for PN, see goals Sit to stand holding 4# ball in BUE, 1 x 5, then 1 x 15  10/25/23 Nustep level 5 x 6 minutes Leg curls 25# 3x10 Leg extension 5# 2x10 5# straight arm pulls 5# hip extension  5# hip abduction In pbars step turn reach Alternating 12" toe touches Walking ball toss Feet on ball K2C, rotation, bridge. Isometric abs Passive stretch LE's  10/11/23 NuStep L5 x63mins  Walk outdoors around back building  Resisted gait 20# 3 way x3 CGA On airex taps on 6" CGA Straight arm pulls 5# 2x10 STS on airex 2x10  10/04/23 NuStep L5 x 6 minutes Seated lumbar mobilization, pelvic tilts, then weight shifts in rotation to each side. He required mod TC and VC to isolate lower trunk. Improved with repetition. Ambulation outdoors on unlevel surfaces, slightly increased speed. Good form with no foot drag, x 600'. Standing shoulder ext and row, 10#, 2 x 10 reps Standing shoulder ER, Gtband, 2 x 10 reps Standing anti rotation exercise for trunk stability 10#, 2 x 10 each side Seated knee flex, 25#, BLE, 2 x 10 reps Seated knee ext, 5#, BLE, 2 x  10 reps  09/27/23 Bike L3 x43mins HS stretch 30s  Feet on pball K2C, bridges, rotations x10 STS holding yellow ball 2x10  Walking on beam On airex cone taps Step ups 6" Seated row and lat pull down 20# 2x10    09/22/23 NuStep L5 x 6 minutes 5x STS, see goals Ambulation x 100' for gait assessment. Patient overall lower toned, slow, slightly drags feet. However, when he increased his speed to a more normal pace, his B foot clearance improved with increased arm  swing, and slightly taller posture maintained. Performed balance training on floor mat, performing B side stepping, then progressing to alternately tapping a cone on the floor with each foot and side stepping in each direction. X2 each way, CGA, no LOB, but unsteadiness noted. Repeated activity while holding 4# ball and adding chest press at each cone to engage upper body. Improved knee lift noted at each cone, possibly due to more engaged trunk. Standing step taps in all directions upon therapist command, returning to center each time. Increased speed of movement, all completed with CGA, no unsteadiness. Alternating step taps on 4" step, no UE support, increasing speed of movement to quick alternating taps, no LOB, able to clear his toes with each tap, approximately 30 times each leg.  09/20/23 NuStep L5 x 6 minutes Supine piriformis stretch B, muscle palpation to L hip an gluts, no TTP. SI screen performed on L due to reports of intermittent pain in L hip in area of SI- negative. Supine trunk and LE strength- bridge, clamshells with G tband resistance Seated HS stretch 3 x 15 sec to each leg Sit to stand x 10 B side to side step with G tband at knees x 10 each way, 1 episode of LOB Standing shoulder ext, rows, ER, Red Tband, 10 reps  09/16/23 Bike L2 x6mins  STS 2x10 Leg press 20# 2x10 Straight arm pulls 5# 2x10 AR press 5# 2x10 Bridges 2x10 Trunk rotations x10 Passive stretching for LE's  blackTB extension  2x10  09/14/23 Nustep level 4 x 5 minutes 20# leg curls 2x10 5# straight arm pulls 5# leg extension On airex in pbars ball toss Ball kicks  Feet on ball K2C, rotation, bridges and isometric abs Passive stretch LE's  09/06/23 Education    PATIENT EDUCATION:  Education details: POC Person educated: Patient Education method: Explanation Education comprehension: verbalized understanding  HOME EXERCISE PROGRAM: Access Code: [email or web address] URL: [email or web address] Date: 09/16/2023 Prepared by: Cassie Freer  Exercises - Supine Bridge  - 1 x daily - 7 x weekly - 2 sets - 10 reps - Supine Lower Trunk Rotation  - 1 x daily - 7 x weekly - 2 sets - 10 reps - Supine Double Knee to Chest Modified  - 1 x daily - 7 x weekly - 2 reps - 30 hold - Supine Single Knee to Chest Stretch  - 1 x daily - 7 x weekly - 2 reps - 30 hold - Sit to Stand with Arms Crossed  - 1 x daily - 7 x weekly - 2 sets - 10 reps  ASSESSMENT:  CLINICAL IMPRESSION: Goals re-assessed for PN. Patient has made significant gains, meeting some of his goals. Needed to progress his HEP because some of the exercises had gotten too easy. He still reports some weakness in legs, so focused on strengthening and balance. Still benefiting from guided progression of his exercises by skilled PT to maximize safety and decrease fall risk.  OBJECTIVE IMPAIRMENTS: Abnormal gait, decreased activity tolerance, decreased balance, decreased coordination, difficulty walking, decreased ROM, decreased strength, and pain.   ACTIVITY LIMITATIONS: standing, squatting, stairs, and locomotion level  PARTICIPATION LIMITATIONS: meal prep, cleaning, laundry, driving, shopping, and community activity  PERSONAL FACTORS: Past/current experiences are also affecting patient's functional outcome.   REHAB POTENTIAL: Good  CLINICAL DECISION MAKING: Stable/uncomplicated  EVALUATION COMPLEXITY: Low   GOALS: Goals reviewed with patient?  Yes  SHORT TERM GOALS: Target date: 09/23/23 I with initial HEP Baseline: Goal status: 09/20/23-Provided with initial HEP and performing,  met  LONG TERM GOALS: Target date: 11/15/23  I with Final HEP Baseline:  Goal status: ongoing 10/25/23  2.  Decrease 5x STS to < 12 sec with no pushing off mat, no leaning back Baseline:  Goal status: 11/10/23 9.86 sec, no lean, met  3.  Patient will ambulate x at least 500' on level and unlevel surfaces MI with LRAD, no reports of pain Baseline:  Goal status: 10/04/23- 600', unlevel surfaces, S, ongoing  4.  Patient will score at least 24 on FGA Baseline:  Goal status: Progressing 11/10/23 23  5.  Patient will report no episodes of back or hip pain x at least 3 weeks in a row. Baseline:  Goal status: 11/10/23 Max 5/10, but patient reports excellent improvement overall, ongoing PLAN:  PT FREQUENCY: 1-2x/week  PT DURATION: 10 weeks  PLANNED INTERVENTIONS: 97110-Therapeutic exercises, 97530- Therapeutic activity, 97112- Neuromuscular re-education, 97535- Self Care, 81191- Manual therapy, [id number]- Gait training, 97014- Electrical stimulation (unattended), [id number]- Traction (mechanical), [id number]- Ionotophoresis 4mg /ml Dexamethasone, Patient/Family education, Balance training, Dry Needling, Cryotherapy, Moist heat, and 97146- PT Re-evaluation  PLAN FOR NEXT SESSION:  continue to challenge strength and balance  Oley Balm DPT 11/10/23 6:50 PM  11/10/23 6:50 PM

## 2023-11-11 DIAGNOSIS — N400 Enlarged prostate without lower urinary tract symptoms: Secondary | ICD-10-CM | POA: Diagnosis not present

## 2023-11-11 DIAGNOSIS — N3941 Urge incontinence: Secondary | ICD-10-CM | POA: Diagnosis not present

## 2023-11-11 DIAGNOSIS — E785 Hyperlipidemia, unspecified: Secondary | ICD-10-CM | POA: Diagnosis not present

## 2023-11-11 DIAGNOSIS — M4800 Spinal stenosis, site unspecified: Secondary | ICD-10-CM | POA: Diagnosis not present

## 2023-11-11 DIAGNOSIS — R2681 Unsteadiness on feet: Secondary | ICD-10-CM | POA: Diagnosis not present

## 2023-11-11 DIAGNOSIS — K219 Gastro-esophageal reflux disease without esophagitis: Secondary | ICD-10-CM | POA: Diagnosis not present

## 2023-11-11 DIAGNOSIS — E559 Vitamin D deficiency, unspecified: Secondary | ICD-10-CM | POA: Diagnosis not present

## 2023-11-11 DIAGNOSIS — I129 Hypertensive chronic kidney disease with stage 1 through stage 4 chronic kidney disease, or unspecified chronic kidney disease: Secondary | ICD-10-CM | POA: Diagnosis not present

## 2023-11-11 DIAGNOSIS — M5430 Sciatica, unspecified side: Secondary | ICD-10-CM | POA: Diagnosis not present

## 2023-11-15 ENCOUNTER — Ambulatory Visit: Payer: Medicare PPO | Admitting: Physical Therapy

## 2023-12-18 ENCOUNTER — Encounter (HOSPITAL_COMMUNITY): Payer: Self-pay

## 2023-12-18 ENCOUNTER — Emergency Department (HOSPITAL_COMMUNITY): Payer: No Typology Code available for payment source

## 2023-12-18 ENCOUNTER — Emergency Department (HOSPITAL_COMMUNITY)
Admission: EM | Admit: 2023-12-18 | Discharge: 2023-12-18 | Disposition: A | Discharge status: Home / Self Care | Payer: No Typology Code available for payment source | Attending: Emergency Medicine | Admitting: Emergency Medicine

## 2023-12-18 ENCOUNTER — Other Ambulatory Visit: Payer: Self-pay

## 2023-12-18 DIAGNOSIS — R0789 Other chest pain: Secondary | ICD-10-CM | POA: Insufficient documentation

## 2023-12-18 DIAGNOSIS — Z79899 Other long term (current) drug therapy: Secondary | ICD-10-CM | POA: Insufficient documentation

## 2023-12-18 DIAGNOSIS — N189 Chronic kidney disease, unspecified: Secondary | ICD-10-CM | POA: Insufficient documentation

## 2023-12-18 DIAGNOSIS — D649 Anemia, unspecified: Secondary | ICD-10-CM | POA: Diagnosis not present

## 2023-12-18 DIAGNOSIS — I129 Hypertensive chronic kidney disease with stage 1 through stage 4 chronic kidney disease, or unspecified chronic kidney disease: Secondary | ICD-10-CM | POA: Diagnosis not present

## 2023-12-18 DIAGNOSIS — D696 Thrombocytopenia, unspecified: Secondary | ICD-10-CM | POA: Diagnosis not present

## 2023-12-18 DIAGNOSIS — Z8546 Personal history of malignant neoplasm of prostate: Secondary | ICD-10-CM | POA: Insufficient documentation

## 2023-12-18 LAB — CBC
HCT: 29.8 % — ABNORMAL LOW (ref 39.0–52.0)
Hemoglobin: 10.3 g/dL — ABNORMAL LOW (ref 13.0–17.0)
MCH: 28.9 pg (ref 26.0–34.0)
MCHC: 34.6 g/dL (ref 30.0–36.0)
MCV: 83.7 fL (ref 80.0–100.0)
Platelets: 148 10*3/uL — ABNORMAL LOW (ref 150–400)
RBC: 3.56 MIL/uL — ABNORMAL LOW (ref 4.22–5.81)
RDW: 13.6 % (ref 11.5–15.5)
WBC: 4.4 10*3/uL (ref 4.0–10.5)
nRBC: 0 % (ref 0.0–0.2)

## 2023-12-18 LAB — BASIC METABOLIC PANEL
Anion gap: 6 (ref 5–15)
BUN: 45 mg/dL — ABNORMAL HIGH (ref 8–23)
CO2: 22 mmol/L (ref 22–32)
Calcium: 9.3 mg/dL (ref 8.9–10.3)
Chloride: 113 mmol/L — ABNORMAL HIGH (ref 98–111)
Creatinine, Ser: 3.34 mg/dL — ABNORMAL HIGH (ref 0.61–1.24)
GFR, Estimated: 17 mL/min — ABNORMAL LOW (ref >60)
Glucose, Bld: 96 mg/dL (ref 70–99)
Potassium: 5.2 mmol/L — ABNORMAL HIGH (ref 3.5–5.1)
Sodium: 141 mmol/L (ref 135–145)

## 2023-12-18 LAB — TROPONIN I (HIGH SENSITIVITY)
Troponin I (High Sensitivity): 4 ng/L (ref <18)
Troponin I (High Sensitivity): 5 ng/L (ref <18)

## 2023-12-18 MED ORDER — AMLODIPINE BESYLATE 2.5 MG PO TABS: 2.5000 mg | ORAL_TABLET | Freq: Every day | ORAL | 0 refills | Status: DC

## 2023-12-18 NOTE — ED Triage Notes (Signed)
Patient to ED by POV for chest pain states it started this morning. Pain only occurs when he bends over and denies pain radiating. Patient took OTC gas-X with no relief. Currently denies chest pain or discomfort. He denies cough/ fever /chills.

## 2023-12-18 NOTE — ED Provider Notes (Signed)
[place] EMERGENCY DEPARTMENT AT [facility] Provider Note   CSN: [id number] Arrival date & time: 12/18/23  1507     History  Chief Complaint  Patient presents with   Chest Pain    Lance Todd is a 83 y.o. male.   Chest Pain 83 year old male history of hyperlipidemia, depression, prostate cancer, CKD presenting for chest pain.  This morning he noticed that when he bends over he has left-sided sharp chest pain.  It is only present when he is bent over and then immediately goes away when he stands up.  He has no associated symptoms including shortness of breath, palpitations, dizziness, syncope.  No pleuritic pain or shortness of breath.  Currently is completely asymptomatic.  When he stands up now he has no pain.  He has not had pain like this before.  He did lift a heavy box today, but did not note any pain at the time.  No pain with palpation of the chest.  No abdominal pain.  No headache or neurologic changes.  Never had any nausea or vomiting or diaphoresis.     Home Medications Prior to Admission medications   Medication Sig Start Date End Date Taking? Authorizing Provider  acetaminophen (TYLENOL) 325 MG tablet Take 325-650 mg by mouth every 6 (six) hours as needed for mild pain (pain score 1-3) or headache (or lower back pain).   Yes [provider]  amLODipine (NORVASC) 2.5 MG tablet Take 1 tablet (2.5 mg total) by mouth daily. 12/18/23  Yes Laurence Spates, MD  atorvastatin (LIPITOR) 10 MG tablet Take 10 mg by mouth every Monday, Wednesday, and Friday at 8 PM.   Yes [provider]  calcitRIOL (ROCALTROL) 0.25 MCG capsule Take 0.25 mcg by mouth daily.   Yes [provider]  carboxymethylcellulose (REFRESH PLUS) 0.5 % SOLN Place 1 drop into both eyes 4 (four) times daily as needed (dry eyes).   Yes [provider]  cetirizine (ZYRTEC) 10 MG tablet Take 10 mg by mouth daily.   Yes [provider]  Cholecalciferol  (VITAMIN D-3) 1000 units CAPS Take 1,000 Units by mouth daily.   Yes [provider]  ferrous sulfate 325 (65 FE) MG EC tablet Take 325 mg by mouth every Monday, Wednesday, and Friday.   Yes [provider]  folic acid (FOLVITE) 1 MG tablet Take 1 mg by mouth daily.   Yes [provider]  ketotifen (ZADITOR) 0.025 % ophthalmic solution Place 1 drop into both eyes 2 (two) times daily as needed (for itching).   Yes [provider]  losartan (COZAAR) 25 MG tablet Take 25 mg by mouth daily.   Yes [provider]  mirabegron ER (MYRBETRIQ) 50 MG TB24 tablet Take 50 mg by mouth See admin instructions. Take 50 mg by mouth once a day and stop if B/P approaches 150/100   Yes [provider]  Omega-3 Fatty Acids (FISH OIL PO) Take 1 capsule by mouth daily with breakfast.   Yes [provider]  simethicone (MYLICON) 80 MG chewable tablet Chew 160 mg by mouth every 6 (six) hours as needed for flatulence.   Yes [provider]  sodium chloride (OCEAN) 0.65 % SOLN nasal spray Place 1 spray into both nostrils as needed for congestion.   Yes [provider]  tamsulosin (FLOMAX) 0.4 MG CAPS capsule Take 0.4 mg by mouth at bedtime. 08/31/13  Yes [provider]  lacosamide (VIMPAT) 50 MG TABS tablet Take  1 tablet (50 mg total) by mouth 2 (two) times daily. Patient not taking: Reported on 12/18/2023 12/09/16   Kimber Relic, MD      Allergies    Trospium    Review of Systems   Review of Systems  Cardiovascular:  Positive for chest pain.  Review of systems completed and notable as per HPI.  ROS otherwise negative.   Physical Exam Updated Vital Signs BP (!) 179/74   Pulse 69   Temp 97.6 F (36.4 C) (Oral)   Resp 18   Ht 5\' 5"  (1.651 m)   Wt 79.4 kg   SpO2 100%   BMI 29.12 kg/m  Physical Exam Vitals and nursing note reviewed.  Constitutional:      General: He is not in acute distress.    Appearance: He is  well-developed.  HENT:     Head: Normocephalic and atraumatic.     Mouth/Throat:     Mouth: Mucous membranes are moist.     Pharynx: Oropharynx is clear.  Eyes:     Extraocular Movements: Extraocular movements intact.     Conjunctiva/sclera: Conjunctivae normal.     Pupils: Pupils are equal, round, and reactive to light.  Cardiovascular:     Rate and Rhythm: Normal rate and regular rhythm.     Pulses: Normal pulses.     Heart sounds: Normal heart sounds. No murmur heard. Pulmonary:     Effort: Pulmonary effort is normal. No respiratory distress.     Breath sounds: Normal breath sounds.  Chest:     Chest wall: No tenderness.  Abdominal:     Palpations: Abdomen is soft.     Tenderness: There is no abdominal tenderness. There is no guarding or rebound.  Musculoskeletal:        General: No swelling.     Cervical back: Neck supple.     Right lower leg: No edema.     Left lower leg: No edema.  Skin:    General: Skin is warm and dry.     Capillary Refill: Capillary refill takes less than 2 seconds.  Neurological:     General: No focal deficit present.     Mental Status: He is alert and oriented to person, place, and time. Mental status is at baseline.  Psychiatric:        Mood and Affect: Mood normal.     ED Results / Procedures / Treatments   Labs (all labs ordered are listed, but only abnormal results are displayed) Labs Reviewed  BASIC METABOLIC PANEL - Abnormal; Notable for the following components:      Result Value   Potassium 5.2 (*)    Chloride 113 (*)    BUN 45 (*)    Creatinine, Ser 3.34 (*)    GFR, Estimated 17 (*)    All other components within normal limits  CBC - Abnormal; Notable for the following components:   RBC 3.56 (*)    Hemoglobin 10.3 (*)    HCT 29.8 (*)    Platelets 148 (*)    All other components within normal limits  TROPONIN I (HIGH SENSITIVITY)  TROPONIN I (HIGH SENSITIVITY)    EKG EKG Interpretation Date/Time:  Saturday December 18 2023 15:15:07 EST Ventricular Rate:  66 PR Interval:  198 QRS Duration:  90 QT Interval:  414 QTC Calculation: 434 R Axis:   68  Text Interpretation: Sinus rhythm Abnormal R-wave progression, early transition Nonspecific T abnormalities, lateral leads Baseline wander in lead(s) I III  aVL No significant change since last tracing Confirmed by Fulton Reek [phone]) on 12/18/2023 5:20:27 PM  Radiology DG Chest 2 View Result Date: 12/18/2023 CLINICAL DATA:  Chest pain. EXAM: CHEST - 2 VIEW COMPARISON:  05/03/2023. FINDINGS: Bilateral lung fields are clear. Bilateral costophrenic angles are clear. Normal cardio-mediastinal silhouette. No acute osseous abnormalities. The soft tissues are within normal limits. IMPRESSION: No active cardiopulmonary disease. Electronically Signed   By: Jules Schick M.D.   On: 12/18/2023 16:34    Procedures Procedures    Medications Ordered in ED Medications - No data to display  ED Course/ Medical Decision Making/ A&P                                 Medical Decision Making Amount and/or Complexity of Data Reviewed Labs: ordered. Radiology: ordered.   Medical Decision Making:   Lance Todd is a 83 y.o. male who presented to the ED today with chest pain.  Vital signs reviewed notable for hypertension.  Exam he is well-appearing.  Currently is asymptomatic.  EKG without acute ischemic changes.  His pain is very atypical, it is only present in the left side of chest when he bent over.  Otherwise has been absent.  It has resolved she has had no exertional pain and no pain at rest I have low suspicion for ACS, PE, dissection.  Benign abdominal exam.  Will trend troponin.   Patient placed on continuous vitals and telemetry monitoring while in ED which was reviewed periodically.  Reviewed and confirmed nursing documentation for past medical history, family history, social history.  Reassessment and Plan:   Troponin is negative x 2, remains asymptomatic  without any pain.  Low suspicion for ACS but I think he would benefit from cardiology follow-up.  Labs otherwise over stable anemia, very mild thrombocytopenia.  His renal function is at baseline, creatinine 3.3 from 3.5.  Potassium is 5.2, which is just barely outside of our normal range.  He is no EKG changes, normal renal function, normal urine output.  He said no changes medications.  He is on losartan but this is not new and no dose adjustments have been made.  He has been hypertensive here to the 170s.  He is asymptomatic from this.  I talked with pharmacy, I think would be reasonable to start him on calcium channel blocker.  I will have him follow-up closely with his PCP on Monday to have recheck of his kidney function and potassium.  He is comfortable with this plan.  Discharged in stable condition.   Patient's presentation is most consistent with acute complicated illness / injury requiring diagnostic workup.          Final Clinical Impression(s) / ED Diagnoses Final diagnoses:  Atypical chest pain    Rx / DC Orders ED Discharge Orders          Ordered    amLODipine (NORVASC) 2.5 MG tablet  Daily        12/18/23 2101              Laurence Spates, MD 12/18/23 2101

## 2023-12-18 NOTE — Discharge Instructions (Signed)
You are seen today for chest pain.  Your lab work is reassuring.  Your potassium is very slightly high at 5.2.  Your blood pressure was high as well.  I am starting you on a new medicine to help with your blood pressure, and I want you to call your doctor on Monday to have repeat lab work Monday to recheck your potassium and kidney function.  If you develop worsening pain, difficulty breathing, back pain, belly pain or any other new concerning symptoms you should return to the ED.

## 2023-12-21 DIAGNOSIS — M549 Dorsalgia, unspecified: Secondary | ICD-10-CM | POA: Diagnosis not present

## 2023-12-21 DIAGNOSIS — F4310 Post-traumatic stress disorder, unspecified: Secondary | ICD-10-CM | POA: Diagnosis not present

## 2023-12-21 DIAGNOSIS — R079 Chest pain, unspecified: Secondary | ICD-10-CM | POA: Diagnosis not present

## 2023-12-21 DIAGNOSIS — I10 Essential (primary) hypertension: Secondary | ICD-10-CM | POA: Diagnosis not present

## 2023-12-21 DIAGNOSIS — E875 Hyperkalemia: Secondary | ICD-10-CM | POA: Diagnosis not present

## 2023-12-21 DIAGNOSIS — C61 Malignant neoplasm of prostate: Secondary | ICD-10-CM | POA: Diagnosis not present

## 2023-12-21 DIAGNOSIS — N2581 Secondary hyperparathyroidism of renal origin: Secondary | ICD-10-CM | POA: Diagnosis not present

## 2023-12-21 DIAGNOSIS — N184 Chronic kidney disease, stage 4 (severe): Secondary | ICD-10-CM | POA: Diagnosis not present

## 2023-12-22 ENCOUNTER — Encounter: Payer: Self-pay | Admitting: Cardiology

## 2023-12-22 ENCOUNTER — Ambulatory Visit: Payer: Medicare PPO | Attending: Cardiology | Admitting: Cardiology

## 2023-12-22 VITALS — BP 120/50 | HR 67 | Ht 64.0 in | Wt 171.0 lb

## 2023-12-22 DIAGNOSIS — I10 Essential (primary) hypertension: Secondary | ICD-10-CM | POA: Diagnosis not present

## 2023-12-22 DIAGNOSIS — E785 Hyperlipidemia, unspecified: Secondary | ICD-10-CM | POA: Diagnosis not present

## 2023-12-22 DIAGNOSIS — E875 Hyperkalemia: Secondary | ICD-10-CM

## 2023-12-22 DIAGNOSIS — N184 Chronic kidney disease, stage 4 (severe): Secondary | ICD-10-CM

## 2023-12-22 DIAGNOSIS — R079 Chest pain, unspecified: Secondary | ICD-10-CM

## 2023-12-22 NOTE — Patient Instructions (Addendum)
Medication Instructions:  Stop Amlodipine as directed discuss with your provider Continue all current medicaitons *If you need a refill on your cardiac medications before your next appointment, please call your pharmacy*   Lab Work: none If you have labs (blood work) drawn today and your tests are completely normal, you will receive your results only by: MyChart Message (if you have MyChart) OR A paper copy in the mail If you have any lab test that is abnormal or we need to change your treatment, we will call you to review the results.   Testing/Procedures: Echo  Your physician has requested that you have an echocardiogram. Echocardiography is a painless test that uses sound waves to create images of your heart. It provides your doctor with information about the size and shape of your heart and how well your heart's chambers and valves are working. This procedure takes approximately one hour. There are no restrictions for this procedure. Please do NOT wear cologne, perfume, aftershave, or lotions (deodorant is allowed). Please arrive 15 minutes prior to your appointment time.  Please note: We ask at that you not bring children with you during ultrasound (echo/ vascular) testing. Due to room size and safety concerns, children are not allowed in the ultrasound rooms during exams. Our front office staff cannot provide observation of children in our lobby area while testing is being conducted. An adult accompanying a patient to their appointment will only be allowed in the ultrasound room at the discretion of the ultrasound technician under special circumstances. We apologize for any inconvenience.      Follow-Up: At [facility], you and your health needs are our priority.  As part of our continuing mission to provide you with exceptional heart care, we have created designated Provider Care Teams.  These Care Teams include your primary Cardiologist (physician) and Advanced Practice  Providers (APPs -  Physician Assistants and Nurse Practitioners) who all work together to provide you with the care you need, when you need it.  We recommend signing up for the patient portal called "MyChart".  Sign up information is provided on this After Visit Summary.  MyChart is used to connect with patients for Virtual Visits (Telemedicine).  Patients are able to view lab/test results, encounter notes, upcoming appointments, etc.  Non-urgent messages can be sent to your provider as well.   To learn more about what you can do with MyChart, go to [email or web address].    Your next appointment:   4 month(s)  Provider:   Dr. Bjorn Pippin  Other Instructions    Please report to Radiology at the [facility] Main Entrance 30 minutes early for your test.  [address] [place], [place] [address]                      How to Prepare for Your Cardiac PET/CT Stress Test:  Nothing to eat or drink, except water, 3 hours prior to arrival time.  NO caffeine/decaffeinated products, or chocolate 12 hours prior to arrival. (Please note decaffeinated beverages (teas/coffees) still contain caffeine).  If you have caffeine within 12 hours prior, the test will need to be rescheduled.  Medication instructions: Do not take erectile dysfunction medications for 72 hours prior to test (sildenafil, tadalafil) Do not take nitrates (isosorbide mononitrate, Ranexa) the day before or day of test Do not take tamsulosin the day before or morning of test Hold theophylline containing medications for 12 hours. Hold Dipyridamole 48 hours prior to the test.  You may take your remaining medications with water.  NO perfume, cologne or lotion on chest or abdomen area.   Total time is 1 to 2 hours; you may want to bring reading material for the waiting time.   In preparation for your appointment, medication and supplies will be purchased.  Appointment availability is limited, so if you need to cancel or  reschedule, please call the Radiology Department Scheduler at [phone] 24 hours in advance to avoid a cancellation fee of $100.00  What to Expect When you Arrive:  Once you arrive and check in for your appointment, you will be taken to a preparation room within the Radiology Department.  A technologist or Nurse will obtain your medical history, verify that you are correctly prepped for the exam, and explain the procedure.  Afterwards, an IV will be started in your arm and electrodes will be placed on your skin for EKG monitoring during the stress portion of the exam. Then you will be escorted to the PET/CT scanner.  There, staff will get you positioned on the scanner and obtain a blood pressure and EKG.  During the exam, you will continue to be connected to the EKG and blood pressure machines.  A small, safe amount of a radioactive tracer will be injected in your IV to obtain a series of pictures of your heart along with an injection of a stress agent.    After your Exam:  It is recommended that you eat a meal and drink a caffeinated beverage to counter act any effects of the stress agent.  Drink plenty of fluids for the remainder of the day and urinate frequently for the first couple of hours after the exam.  Your doctor will inform you of your test results within 7-10 business days.  For more information and frequently asked questions, please visit our website: [email or web address]  For questions about your test or how to prepare for your test, please call: Cardiac Imaging Nurse Navigators Office: [phone]

## 2023-12-22 NOTE — Progress Notes (Signed)
Cardiology Office Note:    Date:  12/22/2023   ID:  Lance Todd, DOB 1941/10/14, MRN [id number]  PCP:  [name], MD (Inactive)  Cardiologist:  None  Electrophysiologist:  None   Referring MD: Laurence Spates, MD   Chief Complaint  Patient presents with   Chest Pain    History of Present Illness:    Lance Todd is a 83 y.o. male with a hx of prostate cancer, hyperlipidemia, CKD stage IV who presents as an ED follow-up for chest pain.  He was seen in the ED on 12/18/2023 with chest pain.  Workup workup unremarkable, including negative troponin x 2.  He is reporting chest pain that he describes as sharp pain on left side of chest.  Occurs few times per day, climbs up to a minute.  Worse with bending over.  None over the last few days.  He is doing physical therapy.  Denies any dyspnea, lightheadedness, syncope, lower extremity edema, or palpitations.  No smoking history.  No family history of heart disease that he knows of.   Past Medical History:  Diagnosis Date   Cancer determined by prostate biopsy (HCC)    Depression    Hypercholesterolemia    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   PTSD (post-traumatic stress disorder)     Past Surgical History:  Procedure Laterality Date   PROSTATE SURGERY      Current Medications: Current Meds  Medication Sig   acetaminophen (TYLENOL) 325 MG tablet Take 325-650 mg by mouth every 6 (six) hours as needed for mild pain (pain score 1-3) or headache (or lower back pain).   atorvastatin (LIPITOR) 10 MG tablet Take 10 mg by mouth every Monday, Wednesday, and Friday at 8 PM.   calcitRIOL (ROCALTROL) 0.25 MCG capsule Take 0.25 mcg by mouth daily.   carboxymethylcellulose (REFRESH PLUS) 0.5 % SOLN Place 1 drop into both eyes 4 (four) times daily as needed (dry eyes).   cetirizine (ZYRTEC) 10 MG tablet Take 10 mg by mouth daily.   Cholecalciferol (VITAMIN D-3) 1000 units CAPS Take 1,000  Units by mouth daily.   ferrous sulfate 325 (65 FE) MG EC tablet Take 325 mg by mouth every Monday, Wednesday, and Friday.   folic acid (FOLVITE) 1 MG tablet Take 1 mg by mouth daily.   ketotifen (ZADITOR) 0.025 % ophthalmic solution Place 1 drop into both eyes 2 (two) times daily as needed (for itching).   losartan (COZAAR) 25 MG tablet Take 25 mg by mouth daily.   mirabegron ER (MYRBETRIQ) 50 MG TB24 tablet Take 50 mg by mouth See admin instructions. Take 50 mg by mouth once a day and stop if B/P approaches 150/100   Omega-3 Fatty Acids (FISH OIL PO) Take 1 capsule by mouth daily with breakfast.   simethicone (MYLICON) 80 MG chewable tablet Chew 160 mg by mouth every 6 (six) hours as needed for flatulence.   sodium chloride (OCEAN) 0.65 % SOLN nasal spray Place 1 spray into both nostrils as needed for congestion.   tamsulosin (FLOMAX) 0.4 MG CAPS capsule Take 0.4 mg by mouth at bedtime.     Allergies:   Trospium   Social History   Socioeconomic History   Marital status: Single    Spouse name: Not on file   Number of children: Not on file   Years of education: Not on file   Highest education level: Not on file  Occupational History  Not on file  Tobacco Use   Smoking status: Never   Smokeless tobacco: Never  Substance and Sexual Activity   Alcohol use: No   Drug use: No   Sexual activity: Not Currently  Other Topics Concern   Not on file  Social History Narrative   Not on file   Social Drivers of Health   Financial Resource Strain: Not on file  Food Insecurity: No Food Insecurity (05/04/2023)   Hunger Vital Sign    Worried About Running Out of Food in the Last Year: Never true    Ran Out of Food in the Last Year: Never true  Transportation Needs: No Transportation Needs (05/04/2023)   PRAPARE - Administrator, Civil Service (Medical): No    Lack of Transportation (Non-Medical): No  Physical Activity: Not on file  Stress: Not on file  Social Connections:  Unknown (05/25/2022)   Received from [facility]   Social Network    Social Network: Not on file     Family History: The patient's family history includes Seizures in his sister.  ROS:   Please see the history of present illness.     All other systems reviewed and are negative.  EKGs/Labs/Other Studies Reviewed:    The following studies were reviewed today:   EKG:   12/22/23: Normal sinus rhythm, rate 67, nonspecific T wave flattening  Recent Labs: 05/04/2023: ALT 14 12/18/2023: BUN 45; Creatinine, Ser 3.34; Hemoglobin 10.3; Platelets 148; Potassium 5.2; Sodium 141  Recent Lipid Panel No results found for: "CHOL", "TRIG", "HDL", "CHOLHDL", "VLDL", "LDLCALC", "LDLDIRECT"  Physical Exam:    VS:  BP (!) 120/50   Pulse 67   Ht 5\' 4"  (1.626 m)   Wt 171 lb (77.6 kg)   SpO2 96%   BMI 29.35 kg/m     Wt Readings from Last 3 Encounters:  12/22/23 171 lb (77.6 kg)  12/18/23 175 lb (79.4 kg)  12/21/16 168 lb (76.2 kg)     GEN:  Well nourished, well developed in no acute distress HEENT: Normal NECK: No JVD; No carotid bruits LYMPHATICS: No lymphadenopathy CARDIAC: RRR, no murmurs, rubs, gallops RESPIRATORY:  Clear to auscultation without rales, wheezing or rhonchi  ABDOMEN: Soft, non-tender, non-distended MUSCULOSKELETAL:  No edema; No deformity  SKIN: Warm and dry NEUROLOGIC:  Alert and oriented x 3 PSYCHIATRIC:  Normal affect   ASSESSMENT:    1. Chest pain of uncertain etiology   2. Essential hypertension   3. CKD (chronic kidney disease), stage IV (HCC)   4. Hyperlipidemia, unspecified hyperlipidemia type   5. Hyperkalemia    PLAN:    Chest pain: Atypical in description but does have multiple CAD risk factors.  Not a good coronary CTA candidate given his CKD.  Recommend stress PET to evaluate for ischemia.  Check echocardiogram to evaluate for structural heart disease.  He also reports recent monitor that he wore through the [place], will obtain  records  Hypertension: On losartan 25 mg daily.  Amlodipine 2.5 mg daily was added at ED visit on 12/18/2023 but reports he is not taking, BP has been well-controlled.  Did have mild hyperkalemia (K5.2) at ED visit; had repeat BMET done with PCP yesterday.  Will obtain records, if having hyperkalemia would favor stopping losartan and starting amlodipine  Hyperlipidemia: On atorvastatin 10 mg 3 times per week.  LDL 76 on 07/20/2023  CKD stage IV: Most recent creatinine 3.34.  Follows with nephrology  Hyperkalemia: Mild hyperkalemia (5.2) 12/18/2023.  He is on losartan.  Recheck BMET, if potassium elevated would favor stopping losartan  RTC in 4 months  Informed Consent   Shared Decision Making/Informed Consent The risks [chest pain, shortness of breath, cardiac arrhythmias, dizziness, blood pressure fluctuations, myocardial infarction, stroke/transient ischemic attack, nausea, vomiting, allergic reaction, radiation exposure, metallic taste sensation and life-threatening complications (estimated to be 1 in 10,000)], benefits (risk stratification, diagnosing coronary artery disease, treatment guidance) and alternatives of a cardiac PET stress test were discussed in detail with Lance Todd and he agrees to proceed.       Medication Adjustments/Labs and Tests Ordered: Current medicines are reviewed at length with the patient today.  Concerns regarding medicines are outlined above.  Orders Placed This Encounter  Procedures   NM PET CT CARDIAC PERFUSION MULTI W/ABSOLUTE BLOODFLOW   Cardiac Stress Test: Informed Consent Details: Physician/Practitioner Attestation; Transcribe to consent form and obtain patient signature   EKG 12-Lead   ECHOCARDIOGRAM COMPLETE   ECHOCARDIOGRAM COMPLETE   No orders of the defined types were placed in this encounter.   Patient Instructions  Medication Instructions:  Stop Amlodipine as directed discuss with your provider Continue all current medicaitons *If you  need a refill on your cardiac medications before your next appointment, please call your pharmacy*   Lab Work: none If you have labs (blood work) drawn today and your tests are completely normal, you will receive your results only by: MyChart Message (if you have MyChart) OR A paper copy in the mail If you have any lab test that is abnormal or we need to change your treatment, we will call you to review the results.   Testing/Procedures: Echo  Your physician has requested that you have an echocardiogram. Echocardiography is a painless test that uses sound waves to create images of your heart. It provides your doctor with information about the size and shape of your heart and how well your heart's chambers and valves are working. This procedure takes approximately one hour. There are no restrictions for this procedure. Please do NOT wear cologne, perfume, aftershave, or lotions (deodorant is allowed). Please arrive 15 minutes prior to your appointment time.  Please note: We ask at that you not bring children with you during ultrasound (echo/ vascular) testing. Due to room size and safety concerns, children are not allowed in the ultrasound rooms during exams. Our front office staff cannot provide observation of children in our lobby area while testing is being conducted. An adult accompanying a patient to their appointment will only be allowed in the ultrasound room at the discretion of the ultrasound technician under special circumstances. We apologize for any inconvenience.      Follow-Up: At [facility], you and your health needs are our priority.  As part of our continuing mission to provide you with exceptional heart care, we have created designated Provider Care Teams.  These Care Teams include your primary Cardiologist (physician) and Advanced Practice Providers (APPs -  Physician Assistants and Nurse Practitioners) who all work together to provide you with the care you need,  when you need it.  We recommend signing up for the patient portal called "MyChart".  Sign up information is provided on this After Visit Summary.  MyChart is used to connect with patients for Virtual Visits (Telemedicine).  Patients are able to view lab/test results, encounter notes, upcoming appointments, etc.  Non-urgent messages can be sent to your provider as well.   To learn more about what you can do with MyChart,  go to [email or web address].    Your next appointment:   4 month(s)  Provider:   Dr. Bjorn Pippin  Other Instructions    Please report to Radiology at the [facility] Main Entrance 30 minutes early for your test.  [address] [place], [place] [address]                      How to Prepare for Your Cardiac PET/CT Stress Test:  Nothing to eat or drink, except water, 3 hours prior to arrival time.  NO caffeine/decaffeinated products, or chocolate 12 hours prior to arrival. (Please note decaffeinated beverages (teas/coffees) still contain caffeine).  If you have caffeine within 12 hours prior, the test will need to be rescheduled.  Medication instructions: Do not take erectile dysfunction medications for 72 hours prior to test (sildenafil, tadalafil) Do not take nitrates (isosorbide mononitrate, Ranexa) the day before or day of test Do not take tamsulosin the day before or morning of test Hold theophylline containing medications for 12 hours. Hold Dipyridamole 48 hours prior to the test.  You may take your remaining medications with water.  NO perfume, cologne or lotion on chest or abdomen area.   Total time is 1 to 2 hours; you may want to bring reading material for the waiting time.   In preparation for your appointment, medication and supplies will be purchased.  Appointment availability is limited, so if you need to cancel or reschedule, please call the Radiology Department Scheduler at [phone] 24 hours in advance to avoid a cancellation fee of  $100.00  What to Expect When you Arrive:  Once you arrive and check in for your appointment, you will be taken to a preparation room within the Radiology Department.  A technologist or Nurse will obtain your medical history, verify that you are correctly prepped for the exam, and explain the procedure.  Afterwards, an IV will be started in your arm and electrodes will be placed on your skin for EKG monitoring during the stress portion of the exam. Then you will be escorted to the PET/CT scanner.  There, staff will get you positioned on the scanner and obtain a blood pressure and EKG.  During the exam, you will continue to be connected to the EKG and blood pressure machines.  A small, safe amount of a radioactive tracer will be injected in your IV to obtain a series of pictures of your heart along with an injection of a stress agent.    After your Exam:  It is recommended that you eat a meal and drink a caffeinated beverage to counter act any effects of the stress agent.  Drink plenty of fluids for the remainder of the day and urinate frequently for the first couple of hours after the exam.  Your doctor will inform you of your test results within 7-10 business days.  For more information and frequently asked questions, please visit our website: [email or web address]  For questions about your test or how to prepare for your test, please call: Cardiac Imaging Nurse Navigators Office: [phone]          Signed, Little Ishikawa, MD  12/22/2023 10:43 AM    [facility]

## 2023-12-24 ENCOUNTER — Ambulatory Visit (HOSPITAL_COMMUNITY): Payer: Medicare PPO | Attending: Cardiology

## 2023-12-24 DIAGNOSIS — R079 Chest pain, unspecified: Secondary | ICD-10-CM | POA: Diagnosis not present

## 2023-12-24 LAB — ECHOCARDIOGRAM COMPLETE
Area-P 1/2: 2.95 cm2
S' Lateral: 2 cm

## 2023-12-27 ENCOUNTER — Encounter: Payer: Self-pay | Admitting: *Deleted

## 2023-12-28 DIAGNOSIS — R9721 Rising PSA following treatment for malignant neoplasm of prostate: Secondary | ICD-10-CM | POA: Diagnosis not present

## 2023-12-28 DIAGNOSIS — Z8546 Personal history of malignant neoplasm of prostate: Secondary | ICD-10-CM | POA: Diagnosis not present

## 2024-01-14 ENCOUNTER — Encounter (HOSPITAL_COMMUNITY): Payer: Self-pay

## 2024-01-17 ENCOUNTER — Telehealth (HOSPITAL_COMMUNITY): Payer: Self-pay | Admitting: *Deleted

## 2024-01-17 NOTE — Telephone Encounter (Signed)
 Reaching out to patient to offer assistance regarding upcoming cardiac imaging study; pt verbalizes understanding of appt date/time, parking situation and where to check in, pre-test NPO status, and verified current allergies; name and call back number provided for further questions should they arise  Larey Brick RN Navigator Cardiac Imaging [facility] [phone] office [phone] cell  Patient aware to avoid caffeine 12 hours prior to his cardiac PET scan.

## 2024-01-18 ENCOUNTER — Encounter (HOSPITAL_COMMUNITY)
Admission: RE | Admit: 2024-01-18 | Discharge: 2024-01-18 | Disposition: A | Discharge status: Home / Self Care | Payer: Medicare PPO | Source: Ambulatory Visit | Attending: Cardiology | Admitting: Cardiology

## 2024-01-18 DIAGNOSIS — D638 Anemia in other chronic diseases classified elsewhere: Secondary | ICD-10-CM | POA: Diagnosis not present

## 2024-01-18 DIAGNOSIS — R079 Chest pain, unspecified: Secondary | ICD-10-CM | POA: Diagnosis not present

## 2024-01-18 DIAGNOSIS — C61 Malignant neoplasm of prostate: Secondary | ICD-10-CM | POA: Diagnosis not present

## 2024-01-18 DIAGNOSIS — I10 Essential (primary) hypertension: Secondary | ICD-10-CM | POA: Diagnosis not present

## 2024-01-18 DIAGNOSIS — N184 Chronic kidney disease, stage 4 (severe): Secondary | ICD-10-CM | POA: Diagnosis not present

## 2024-01-18 DIAGNOSIS — R3915 Urgency of urination: Secondary | ICD-10-CM | POA: Diagnosis not present

## 2024-01-18 DIAGNOSIS — N2581 Secondary hyperparathyroidism of renal origin: Secondary | ICD-10-CM | POA: Diagnosis not present

## 2024-01-18 DIAGNOSIS — J8410 Pulmonary fibrosis, unspecified: Secondary | ICD-10-CM | POA: Diagnosis not present

## 2024-01-18 LAB — NM PET CT CARDIAC PERFUSION MULTI W/ABSOLUTE BLOODFLOW
LV dias vol: 63 mL (ref 62–150)
LV sys vol: 35 mL
MBFR: 1.59
Nuc Rest EF: 44 %
Nuc Stress EF: 70 %
Rest MBF: 0.63 ml/g/min
Rest Nuclear Isotope Dose: 20.4 mCi
ST Depression (mm): 0 mm
Stress MBF: 1 ml/g/min
Stress Nuclear Isotope Dose: 20 mCi

## 2024-01-18 MED ORDER — REGADENOSON 0.4 MG/5ML IV SOLN
INTRAVENOUS | Status: AC
  Filled 2024-01-18: qty 5

## 2024-01-18 MED ORDER — RUBIDIUM RB82 GENERATOR (RUBYFILL)
20.0400 | PACK | Freq: Once | INTRAVENOUS | Status: AC
  Administered 2024-01-18: 20.04 via INTRAVENOUS

## 2024-01-18 MED ORDER — RUBIDIUM RB82 GENERATOR (RUBYFILL)
20.3500 | PACK | Freq: Once | INTRAVENOUS | Status: AC
  Administered 2024-01-18: 20.35 via INTRAVENOUS

## 2024-01-18 MED ORDER — REGADENOSON 0.4 MG/5ML IV SOLN
0.4000 mg | Freq: Once | INTRAVENOUS | Status: AC
  Administered 2024-01-18: 0.4 mg via INTRAVENOUS

## 2024-01-18 NOTE — Progress Notes (Signed)
 Pt. Tolerated lexi scan well.

## 2024-01-21 ENCOUNTER — Other Ambulatory Visit (HOSPITAL_COMMUNITY): Payer: Medicare PPO

## 2024-01-27 ENCOUNTER — Encounter: Payer: Self-pay | Admitting: Skilled Nursing Facility1

## 2024-01-27 ENCOUNTER — Encounter: Payer: Medicare PPO | Attending: Family Medicine | Admitting: Skilled Nursing Facility1

## 2024-01-27 VITALS — Ht 65.0 in | Wt 169.6 lb

## 2024-01-27 DIAGNOSIS — N183 Chronic kidney disease, stage 3 unspecified: Secondary | ICD-10-CM | POA: Insufficient documentation

## 2024-01-27 NOTE — Progress Notes (Signed)
 Medical Nutrition Therapy  Appointment Start time:  2:06  Appointment End time:  3:35  Primary concerns today: how to eat with CKD  Referral diagnosis: n18.9   NUTRITION ASSESSMENT    Clinical Medical Hx: CKD stage 4 Medications: see list Labs: potassium 5.2 Notable Signs/Symptoms: none identified   Lifestyle & Dietary Hx  Pt arrives with his supportive wife.  Pt states he goes to bed around 10pm.   Due to pt not consuming 3 meals a day pt is able to continue to drink his protein shakes as he loves them if they are only drinking half of them due to the total protein needing to be lowered with CKD stage 4 according to [facility] referral.   Pt and his wife state they really enjoy eating out: Dietitian advised to try splitting their meals when they eat out due to the serving size of meats and sodium amounts in these foods.   Estimated daily fluid intake: 48 oz Supplements:  Sleep:  Stress / self-care:  Current average weekly physical activity: 2 times a week   24-Hr Dietary Recall First Meal 10am: sausage egg and grits or cereal or oatmeal Snack:  Second Meal: belvita Snack:  Third Meal 6-8pm: fish + spinach Snack:  Beverages: coffee, water, applejuice   NUTRITION INTERVENTION  Nutrition education (E-1) on the following topics:  CKD and lowered protein needs Last lab value of K being out of range and consumption of juices Avoidance of high sodium foods such as eating out often  Inclusion of 64+ ounces of water daily Lack of current evidence dictating avoidance of potassium containing foods   Handouts Provided Include  Detailed MyPlate specifically for renal health  Learning Style & Readiness for Change Teaching method utilized: Visual & Auditory  Demonstrated degree of understanding via: Teach Back  Barriers to learning/adherence to lifestyle change: none identified   Goals Established by Pt 5 12 ounce cups of water minimum per day Try alternating between apple and  banana daily    MONITORING & EVALUATION Dietary intake, weekly physical activity  Next Steps  Patient is to call or email with any further questions or concerns as pt does not desire further follow up at this time.

## 2024-02-27 NOTE — Progress Notes (Unsigned)
 Cardiology Office Note:    Date:  02/29/2024   ID:  Lance Todd, DOB Apr 09, 1941, MRN [id number]  PCP:  [name], MD  Cardiologist:  None  Electrophysiologist:  None   Referring MD: No ref. provider found   Chief Complaint  Patient presents with   Chest Pain    History of Present Illness:    Lance Todd is a 83 y.o. male with a hx of prostate cancer, hyperlipidemia, CKD stage IV who presents for follow-up.  He was initially seen as an ED follow-up for chest pain.  He was seen in the ED on 12/18/2023 with chest pain.  Workup unremarkable, including negative troponin x 2.  He is reporting chest pain that he describes as sharp pain on left side of chest.  Occurs few times per day, climbs up to a minute.  Worse with bending over.  None over the last few days.  He is doing physical therapy.  Denies any dyspnea, lightheadedness, syncope, lower extremity edema, or palpitations.  No smoking history.  No family history of heart disease that he knows of.  Echocardiogram 12/24/2023 showed normal biventricular function, no significant valvular disease.  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  Since last clinic visit, he reports he is doing well.  Denies any chest pain, dyspnea, lightheadedness, syncope, lower extremity edema, or palpitations.  He reports that he goes to the gym twice per week and works out with Systems analyst.    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   aspirin EC 81 MG tablet Take 81 mg by mouth daily. Swallow whole.   calcitRIOL (ROCALTROL) 0.25 MCG capsule Take 0.25 mcg by mouth daily.   Calcium Carbonate-Vitamin D 600-3.125 MG-MCG TABS 1 tablet Orally Twice a day   carboxymethylcellulose (REFRESH PLUS) 0.5 % SOLN Place 1 drop into both eyes 4 (four) times daily as needed (dry eyes).   cetirizine (ZYRTEC) 10 MG tablet Take 10 mg by mouth daily.   chlorpheniramine (CHLOR-TRIMETON) 4 MG tablet 1 tablet as needed Orally every 6 hrs   Cholecalciferol (VITAMIN D-3) 1000 units CAPS Take 1,000 Units by mouth daily.   ferrous sulfate 325 (65 FE) MG EC tablet Take 325 mg by mouth every Monday, Wednesday, and Friday.   fluticasone (FLONASE) 50 MCG/ACT nasal spray Place 2 sprays into the nose.   folic acid (FOLVITE) 1 MG tablet Take 1 mg by mouth daily.   ketotifen (ZADITOR) 0.025 % ophthalmic solution Place 1 drop into both eyes 2 (two) times daily as needed (for itching).   losartan (COZAAR) 25 MG tablet Take 25 mg by mouth daily.   Omega-3 Fatty Acids (FISH OIL PO) Take 1 capsule by mouth daily with breakfast.   simethicone (MYLICON) 80 MG chewable tablet Chew 160 mg by mouth every 6 (six)  hours as needed for flatulence.   sodium bicarbonate 650 MG tablet Take by mouth.   sodium chloride (OCEAN) 0.65 % SOLN nasal spray Place 1 spray into both nostrils as needed for congestion.   tamsulosin (FLOMAX) 0.4 MG CAPS capsule Take 0.4 mg by mouth at bedtime.   traZODone (DESYREL) 50 MG tablet Take by mouth.   Vibegron 75 MG TABS Take by mouth.   [DISCONTINUED] atorvastatin (LIPITOR) 10 MG tablet Take 10 mg by mouth every Monday, Wednesday, and Friday at 8 PM.     Allergies:   Trospium   Social History   Socioeconomic History   Marital status: Single    Spouse name: Not on  file   Number of children: Not on file   Years of education: Not on file   Highest education level: Not on file  Occupational History   Not on file  Tobacco Use   Smoking status: Never   Smokeless tobacco: Never  Substance and Sexual Activity   Alcohol use: No   Drug use: No   Sexual activity: Not Currently  Other Topics Concern   Not on file  Social History Narrative   Not on file   Social Drivers of Health   Financial Resource Strain: Not on file  Food Insecurity: No Food Insecurity (05/04/2023)   Hunger Vital Sign    Worried About Running Out of Food in the Last Year: Never true    Ran Out of Food in the Last Year: Never true  Transportation Needs: No Transportation Needs (05/04/2023)   PRAPARE - Administrator, Civil Service (Medical): No    Lack of Transportation (Non-Medical): No  Physical Activity: Not on file  Stress: Not on file  Social Connections: Unknown (05/25/2022)   Received from [facility]   Social Network    Social Network: Not on file     Family History: The patient's family history includes Seizures in his sister.  ROS:   Please see the history of present illness.     All other systems reviewed and are negative.  EKGs/Labs/Other Studies Reviewed:    The following studies were reviewed today:   EKG:   12/22/23: Normal sinus rhythm, rate 67, nonspecific T wave flattening  Recent Labs: 05/04/2023: ALT 14 12/18/2023: BUN 45; Creatinine, Ser 3.34; Hemoglobin 10.3; Platelets 148; Potassium 5.2; Sodium 141  Recent Lipid Panel No results found for: "CHOL", "TRIG", "HDL", "CHOLHDL", "VLDL", "LDLCALC", "LDLDIRECT"  Physical Exam:    VS:  BP (!) 122/52   Pulse 88   Ht 5\' 4"  (1.626 m)   Wt 167 lb (75.8 kg)   SpO2 99%   BMI 28.67 kg/m     Wt Readings from Last 3 Encounters:  02/29/24 167 lb (75.8 kg)  01/27/24 169 lb 9.6 oz (76.9 kg)  12/22/23 171 lb (77.6 kg)     GEN:  Well nourished, well developed in no acute  distress HEENT: Normal NECK: No JVD; No carotid bruits LYMPHATICS: No lymphadenopathy CARDIAC: RRR, no murmurs, rubs, gallops RESPIRATORY:  Clear to auscultation without rales, wheezing or rhonchi  ABDOMEN: Soft, non-tender, non-distended MUSCULOSKELETAL:  No edema; No deformity  SKIN: Warm and dry NEUROLOGIC:  Alert and oriented x 3 PSYCHIATRIC:  Normal affect   ASSESSMENT:    1. Chest pain of uncertain etiology   2. Essential hypertension   3. CKD (chronic kidney disease), stage IV (HCC)   4. Hyperkalemia   5. Hyperlipidemia, unspecified hyperlipidemia type  PLAN:    Chest pain: Atypical in description but does have multiple CAD risk factors.  Echocardiogram 12/24/2023 showed normal biventricular function, no significant valvular disease.  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 - He is not a good candidate for invasive evaluation or coronary CTA given his renal function.  Recommend medical management. - Start aspirin 81 mg daily - Continue atorvastatin, recommend increasing to 10 mg daily - Reports no recent anginal symptoms  Palpitations: He reports recent monitor that he wore through the [place], states that he has the monitor report and will send records to [place]  Hypertension: On losartan 25 mg daily.  Check BMET  Hyperlipidemia: On atorvastatin 10 mg 3 times per week.  LDL 76 on 07/20/2023.  Switch atorvastatin to daily as above  CKD stage IV: Most recent creatinine 3.34.  Follows with nephrology  Hyperkalemia: Mild hyperkalemia (5.2) 12/18/2023.  He is on losartan.  Recheck BMET, if potassium elevated would favor stopping losartan   RTC in 6 months      Medication Adjustments/Labs and Tests Ordered: Current medicines are reviewed at length with the patient today.  Concerns regarding  medicines are outlined above.  Orders Placed This Encounter  Procedures   Basic metabolic panel with GFR   Meds ordered this encounter  Medications   atorvastatin (LIPITOR) 10 MG tablet    Sig: Take 1 tablet (10 mg total) by mouth daily.    Dispense:  90 tablet    Refill:  30    NEW DOSE, D/C PREVIOUS RX    Patient Instructions  Medication Instructions:  START ASPIRIN 81 MG DAILY   START TAKING YOUR ATORVASTATIN DAILY  *If you need a refill on your cardiac medications before your next appointment, please call your pharmacy*  Lab Work: BMET TODAY   If you have labs (blood work) drawn today and your tests are completely normal, you will receive your results only by: MyChart Message (if you have MyChart) OR A paper copy in the mail If you have any lab test that is abnormal or we need to change your treatment, we will call you to review the results.  Testing/Procedures: NONE  Follow-Up: At [facility], you and your health needs are our priority.  As part of our continuing mission to provide you with exceptional heart care, our providers are all part of one team.  This team includes your primary Cardiologist (physician) and Advanced Practice Providers or APPs (Physician Assistants and Nurse Practitioners) who all work together to provide you with the care you need, when you need it.  Your next appointment:   6 month(s)  Provider:   DR Bjorn Pippin   DID CANCEL YOUR MAY  Heart-Healthy Eating Plan Eating a healthy diet is important for the health of your heart. A heart-healthy eating plan includes: Eating less unhealthy fats. Eating more healthy fats. Eating less salt in your food. Salt is also called sodium. Making other changes in your diet. Talk with your doctor or a diet specialist (dietitian) to create an eating plan that is right for you.  What are tips for following this plan? Cooking Avoid frying your food. Try to bake, boil, grill, or broil it instead. You  can also reduce fat by: Removing the skin from poultry. Removing all visible fats from meats. Steaming vegetables in water or broth. Meal planning  At  meals, divide your plate into four equal parts: Fill one-half of your plate with vegetables and green salads. Fill one-fourth of your plate with whole grains. Fill one-fourth of your plate with lean protein foods. Eat 2-4 cups of vegetables per day. One cup of vegetables is: 1 cup (91 g) broccoli or cauliflower florets. 2 medium carrots. 1 large bell pepper. 1 large sweet potato. 1 large tomato. 1 medium white potato. 2 cups (150 g) raw leafy greens. Eat 1-2 cups of fruit per day. One cup of fruit is: 1 small apple 1 large banana 1 cup (237 g) mixed fruit, 1 large orange,  cup (82 g) dried fruit, 1 cup (240 mL) 100% fruit juice. Eat more foods that have soluble fiber. These are apples, broccoli, carrots, beans, peas, and barley. Try to get 20-30 g of fiber per day. Eat 4-5 servings of nuts, legumes, and seeds per week: 1 serving of dried beans or legumes equals  cup (90 g) cooked. 1 serving of nuts is  oz (12 almonds, 24 pistachios, or 7 walnut halves). 1 serving of seeds equals  oz (8 g). General information Eat more home-cooked food. Eat less restaurant, buffet, and fast food. Limit or avoid alcohol. Limit foods that are high in starch and sugar. Avoid fried foods. Lose weight if you are overweight. Keep track of how much salt (sodium) you eat. This is important if you have high blood pressure. Ask your doctor to tell you more about this. Try to add vegetarian meals each week. Fats Choose healthy fats. These include olive oil and canola oil, flaxseeds, walnuts, almonds, and seeds. Eat more omega-3 fats. These include salmon, mackerel, sardines, tuna, flaxseed oil, and ground flaxseeds. Try to eat fish at least 2 times each week. Check food labels. Avoid foods with trans fats or high amounts of saturated fat. Limit  saturated fats. These are often found in animal products, such as meats, butter, and cream. These are also found in plant foods, such as palm oil, palm kernel oil, and coconut oil. Avoid foods with partially hydrogenated oils in them. These have trans fats. Examples are stick margarine, some tub margarines, cookies, crackers, and other baked goods. What foods should I eat? Fruits All fresh, canned (in natural juice), or frozen fruits. Vegetables Fresh or frozen vegetables (raw, steamed, roasted, or grilled). Green salads. Grains Most grains. Choose whole wheat and whole grains most of the time. Rice and pasta, including brown rice and pastas made with whole wheat. Meats and other proteins Lean, well-trimmed beef, veal, pork, and lamb. Chicken and [place] without skin. All fish and shellfish. Wild duck, rabbit, pheasant, and venison. Egg whites or low-cholesterol egg substitutes. Dried beans, peas, lentils, and tofu. Seeds and most nuts. Dairy Low-fat or nonfat cheeses, including ricotta and mozzarella. Skim or 1% milk that is liquid, powdered, or evaporated. Buttermilk that is made with low-fat milk. Nonfat or low-fat yogurt. Fats and oils Non-hydrogenated (trans-free) margarines. Vegetable oils, including soybean, sesame, sunflower, olive, peanut, safflower, corn, canola, and cottonseed. Salad dressings or mayonnaise made with a vegetable oil. Beverages Mineral water. Coffee and tea. Diet carbonated beverages. Sweets and desserts Sherbet, gelatin, and fruit ice. Small amounts of dark chocolate. Limit all sweets and desserts. Seasonings and condiments All seasonings and condiments. The items listed above may not be a complete list of foods and drinks you can eat. Contact a dietitian for more options. What foods should I avoid? Fruits Canned fruit in heavy syrup. Fruit in cream or butter  sauce. Foy [place] fruit. Limit coconut. Vegetables Vegetables cooked in cheese, cream, or butter sauce.  Fried vegetables. Grains Breads that are made with saturated or trans fats, oils, or whole milk. Croissants. Sweet rolls. Donuts. High-fat crackers, such as cheese crackers. Meats and other proteins Fatty meats, such as hot dogs, ribs, sausage, bacon, rib-eye roast or steak. High-fat deli meats, such as salami and bologna. Caviar. Domestic duck and goose. Organ meats, such as liver. Dairy Cream, sour cream, cream cheese, and creamed cottage cheese. Whole-milk cheeses. Whole or 2% milk that is liquid, evaporated, or condensed. Whole buttermilk. Cream sauce or high-fat cheese sauce. Yogurt that is made from whole milk. Fats and oils Meat fat, or shortening. Cocoa butter, hydrogenated oils, palm oil, coconut oil, palm kernel oil. Solid fats and shortenings, including bacon fat, salt pork, lard, and butter. Nondairy cream substitutes. Salad dressings with cheese or sour cream. Beverages Regular sodas and juice drinks with added sugar. Sweets and desserts Frosting. Pudding. Cookies. Cakes. Pies. Milk chocolate or white chocolate. Buttered syrups. Full-fat ice cream or ice cream drinks. The items listed above may not be a complete list of foods and drinks to avoid. Contact a dietitian for more information. Summary Heart-healthy meal planning includes eating less unhealthy fats, eating more healthy fats, and making other changes in your diet. Eat a balanced diet. This includes fruits and vegetables, low-fat or nonfat dairy, lean protein, nuts and legumes, whole grains, and heart-healthy oils and fats. This information is not intended to replace advice given to you by your health care provider. Make sure you discuss any questions you have with your health care provider. Document Revised: 12/15/2021 Document Reviewed: 12/15/2021 Elsevier Patient Education  2024 Elsevier Inc.APPOINTMENT   We recommend signing up for the patient portal called "MyChart".  Sign up information is provided on this After Visit  Summary.  MyChart is used to connect with patients for Virtual Visits (Telemedicine).  Patients are able to view lab/test results, encounter notes, upcoming appointments, etc.  Non-urgent messages can be sent to your provider as well.   To learn more about what you can do with MyChart, go to [email or web address].           Signed, Little Ishikawa, MD  02/29/2024 11:33 PM    [facility]

## 2024-02-29 ENCOUNTER — Encounter (HOSPITAL_BASED_OUTPATIENT_CLINIC_OR_DEPARTMENT_OTHER): Payer: Self-pay | Admitting: Cardiology

## 2024-02-29 ENCOUNTER — Ambulatory Visit (INDEPENDENT_AMBULATORY_CARE_PROVIDER_SITE_OTHER): Admitting: Cardiology

## 2024-02-29 VITALS — BP 122/52 | HR 88 | Ht 64.0 in | Wt 167.0 lb

## 2024-02-29 DIAGNOSIS — E875 Hyperkalemia: Secondary | ICD-10-CM

## 2024-02-29 DIAGNOSIS — N184 Chronic kidney disease, stage 4 (severe): Secondary | ICD-10-CM

## 2024-02-29 DIAGNOSIS — R079 Chest pain, unspecified: Secondary | ICD-10-CM

## 2024-02-29 DIAGNOSIS — I10 Essential (primary) hypertension: Secondary | ICD-10-CM

## 2024-02-29 DIAGNOSIS — E785 Hyperlipidemia, unspecified: Secondary | ICD-10-CM

## 2024-02-29 MED ORDER — ATORVASTATIN CALCIUM 10 MG PO TABS: 10.0000 mg | ORAL_TABLET | Freq: Every day | ORAL | 30 refills | Status: AC

## 2024-02-29 NOTE — Patient Instructions (Addendum)
 Medication Instructions:  START ASPIRIN 81 MG DAILY   START TAKING YOUR ATORVASTATIN DAILY  *If you need a refill on your cardiac medications before your next appointment, please call your pharmacy*  Lab Work: BMET TODAY   If you have labs (blood work) drawn today and your tests are completely normal, you will receive your results only by: MyChart Message (if you have MyChart) OR A paper copy in the mail If you have any lab test that is abnormal or we need to change your treatment, we will call you to review the results.  Testing/Procedures: NONE  Follow-Up: At [facility], you and your health needs are our priority.  As part of our continuing mission to provide you with exceptional heart care, our providers are all part of one team.  This team includes your primary Cardiologist (physician) and Advanced Practice Providers or APPs (Physician Assistants and Nurse Practitioners) who all work together to provide you with the care you need, when you need it.  Your next appointment:   6 month(s)  Provider:   DR Bjorn Pippin   DID CANCEL YOUR MAY  Heart-Healthy Eating Plan Eating a healthy diet is important for the health of your heart. A heart-healthy eating plan includes: Eating less unhealthy fats. Eating more healthy fats. Eating less salt in your food. Salt is also called sodium. Making other changes in your diet. Talk with your doctor or a diet specialist (dietitian) to create an eating plan that is right for you.  What are tips for following this plan? Cooking Avoid frying your food. Try to bake, boil, grill, or broil it instead. You can also reduce fat by: Removing the skin from poultry. Removing all visible fats from meats. Steaming vegetables in water or broth. Meal planning  At meals, divide your plate into four equal parts: Fill one-half of your plate with vegetables and green salads. Fill one-fourth of your plate with whole grains. Fill one-fourth of your  plate with lean protein foods. Eat 2-4 cups of vegetables per day. One cup of vegetables is: 1 cup (91 g) broccoli or cauliflower florets. 2 medium carrots. 1 large bell pepper. 1 large sweet potato. 1 large tomato. 1 medium white potato. 2 cups (150 g) raw leafy greens. Eat 1-2 cups of fruit per day. One cup of fruit is: 1 small apple 1 large banana 1 cup (237 g) mixed fruit, 1 large orange,  cup (82 g) dried fruit, 1 cup (240 mL) 100% fruit juice. Eat more foods that have soluble fiber. These are apples, broccoli, carrots, beans, peas, and barley. Try to get 20-30 g of fiber per day. Eat 4-5 servings of nuts, legumes, and seeds per week: 1 serving of dried beans or legumes equals  cup (90 g) cooked. 1 serving of nuts is  oz (12 almonds, 24 pistachios, or 7 walnut halves). 1 serving of seeds equals  oz (8 g). General information Eat more home-cooked food. Eat less restaurant, buffet, and fast food. Limit or avoid alcohol. Limit foods that are high in starch and sugar. Avoid fried foods. Lose weight if you are overweight. Keep track of how much salt (sodium) you eat. This is important if you have high blood pressure. Ask your doctor to tell you more about this. Try to add vegetarian meals each week. Fats Choose healthy fats. These include olive oil and canola oil, flaxseeds, walnuts, almonds, and seeds. Eat more omega-3 fats. These include salmon, mackerel, sardines, tuna, flaxseed oil, and ground flaxseeds. Try  to eat fish at least 2 times each week. Check food labels. Avoid foods with trans fats or high amounts of saturated fat. Limit saturated fats. These are often found in animal products, such as meats, butter, and cream. These are also found in plant foods, such as palm oil, palm kernel oil, and coconut oil. Avoid foods with partially hydrogenated oils in them. These have trans fats. Examples are stick margarine, some tub margarines, cookies, crackers, and other baked  goods. What foods should I eat? Fruits All fresh, canned (in natural juice), or frozen fruits. Vegetables Fresh or frozen vegetables (raw, steamed, roasted, or grilled). Green salads. Grains Most grains. Choose whole wheat and whole grains most of the time. Rice and pasta, including brown rice and pastas made with whole wheat. Meats and other proteins Lean, well-trimmed beef, veal, pork, and lamb. Chicken and [place] without skin. All fish and shellfish. Wild duck, rabbit, pheasant, and venison. Egg whites or low-cholesterol egg substitutes. Dried beans, peas, lentils, and tofu. Seeds and most nuts. Dairy Low-fat or nonfat cheeses, including ricotta and mozzarella. Skim or 1% milk that is liquid, powdered, or evaporated. Buttermilk that is made with low-fat milk. Nonfat or low-fat yogurt. Fats and oils Non-hydrogenated (trans-free) margarines. Vegetable oils, including soybean, sesame, sunflower, olive, peanut, safflower, corn, canola, and cottonseed. Salad dressings or mayonnaise made with a vegetable oil. Beverages Mineral water. Coffee and tea. Diet carbonated beverages. Sweets and desserts Sherbet, gelatin, and fruit ice. Small amounts of dark chocolate. Limit all sweets and desserts. Seasonings and condiments All seasonings and condiments. The items listed above may not be a complete list of foods and drinks you can eat. Contact a dietitian for more options. What foods should I avoid? Fruits Canned fruit in heavy syrup. Fruit in cream or butter sauce. Fried fruit. Limit coconut. Vegetables Vegetables cooked in cheese, cream, or butter sauce. Fried vegetables. Grains Breads that are made with saturated or trans fats, oils, or whole milk. Croissants. Sweet rolls. Donuts. High-fat crackers, such as cheese crackers. Meats and other proteins Fatty meats, such as hot dogs, ribs, sausage, bacon, rib-eye roast or steak. High-fat deli meats, such as salami and bologna. Caviar. Domestic duck  and goose. Organ meats, such as liver. Dairy Cream, sour cream, cream cheese, and creamed cottage cheese. Whole-milk cheeses. Whole or 2% milk that is liquid, evaporated, or condensed. Whole buttermilk. Cream sauce or high-fat cheese sauce. Yogurt that is made from whole milk. Fats and oils Meat fat, or shortening. Cocoa butter, hydrogenated oils, palm oil, coconut oil, palm kernel oil. Solid fats and shortenings, including bacon fat, salt pork, lard, and butter. Nondairy cream substitutes. Salad dressings with cheese or sour cream. Beverages Regular sodas and juice drinks with added sugar. Sweets and desserts Frosting. Pudding. Cookies. Cakes. Pies. Milk chocolate or white chocolate. Buttered syrups. Full-fat ice cream or ice cream drinks. The items listed above may not be a complete list of foods and drinks to avoid. Contact a dietitian for more information. Summary Heart-healthy meal planning includes eating less unhealthy fats, eating more healthy fats, and making other changes in your diet. Eat a balanced diet. This includes fruits and vegetables, low-fat or nonfat dairy, lean protein, nuts and legumes, whole grains, and heart-healthy oils and fats. This information is not intended to replace advice given to you by your health care provider. Make sure you discuss any questions you have with your health care provider. Document Revised: 12/15/2021 Document Reviewed: 12/15/2021 Elsevier Patient Education  2024 Elsevier Inc.APPOINTMENT  We recommend signing up for the patient portal called "MyChart".  Sign up information is provided on this After Visit Summary.  MyChart is used to connect with patients for Virtual Visits (Telemedicine).  Patients are able to view lab/test results, encounter notes, upcoming appointments, etc.  Non-urgent messages can be sent to your provider as well.   To learn more about what you can do with MyChart, go to [email or web address].

## 2024-03-01 LAB — BASIC METABOLIC PANEL WITH GFR
BUN/Creatinine Ratio: 11 (ref 10–24)
BUN: 35 mg/dL — ABNORMAL HIGH (ref 8–27)
CO2: 20 mmol/L (ref 20–29)
Calcium: 8.9 mg/dL (ref 8.6–10.2)
Chloride: 111 mmol/L — ABNORMAL HIGH (ref 96–106)
Creatinine, Ser: 3.26 mg/dL — ABNORMAL HIGH (ref 0.76–1.27)
Glucose: 104 mg/dL — ABNORMAL HIGH (ref 70–99)
Potassium: 4.9 mmol/L (ref 3.5–5.2)
Sodium: 145 mmol/L — ABNORMAL HIGH (ref 134–144)
eGFR: 18 mL/min/{1.73_m2} — ABNORMAL LOW (ref >59)

## 2024-03-02 ENCOUNTER — Telehealth: Payer: Self-pay | Admitting: Cardiology

## 2024-03-02 NOTE — Telephone Encounter (Signed)
 Paper Work Dropped Off: [facility] testing  Date: 03/02/2024  Location of paper:  Dr.Schumann box

## 2024-03-03 NOTE — Telephone Encounter (Signed)
 Called and made patient ware that paperwork was received and given to provider. Understanding verbalized.

## 2024-04-20 ENCOUNTER — Ambulatory Visit: Payer: Medicare PPO | Admitting: Cardiology

## 2024-06-21 ENCOUNTER — Other Ambulatory Visit: Payer: Self-pay | Admitting: Vascular Surgery

## 2024-06-21 DIAGNOSIS — N189 Chronic kidney disease, unspecified: Secondary | ICD-10-CM

## 2024-07-12 NOTE — Progress Notes (Unsigned)
 Office Note     CC:  ESRD Requesting Provider:  Marvene Pugh, MD  HPI: Lance Todd is a {Handed:22697} handed 83 y.o. (1941-01-05) male with kidney disease who presents at the request of Marvene Pugh, MD for permanent HD access. The patient has had *** prior access procedures. Per pt, previous tunneled lines have been placed in ***. Current access is ***. Dialysis days are ***.   On exam, ***  The pt is *** on a statin for cholesterol management.  The pt is *** on a daily aspirin.   Other AC:  *** The pt is *** on medications for hypertension.   The pt is *** diabetic. Tobacco hx:  ***  Past Medical History:  Diagnosis Date   Cancer determined by prostate biopsy (HCC)    Depression    Hypercholesterolemia    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   PTSD (post-traumatic stress disorder)     Past Surgical History:  Procedure Laterality Date   PROSTATE SURGERY      Social History   Socioeconomic History   Marital status: Single    Spouse name: Not on file   Number of children: Not on file   Years of education: Not on file   Highest education level: Not on file  Occupational History   Not on file  Tobacco Use   Smoking status: Never   Smokeless tobacco: Never  Substance and Sexual Activity   Alcohol use: No   Drug use: No   Sexual activity: Not Currently  Other Topics Concern   Not on file  Social History Narrative   Not on file   Social Drivers of Health   Financial Resource Strain: Not on file  Food Insecurity: No Food Insecurity (05/04/2023)   Hunger Vital Sign    Worried About Running Out of Food in the Last Year: Never true    Ran Out of Food in the Last Year: Never true  Transportation Needs: No Transportation Needs (05/04/2023)   PRAPARE - Administrator, Civil Service (Medical): No    Lack of Transportation (Non-Medical): No  Physical Activity: Not on file  Stress: Not on file   Social Connections: Unknown (05/25/2022)   [facility]   Social Network    Social Network: Not on file  Intimate Partner Violence: Not At Risk (05/04/2023)   Humiliation, Afraid, Rape, and Kick questionnaire    Fear of Current or Ex-Partner: No    Emotionally Abused: No    Physically Abused: No    Sexually Abused: No   *** Family History  Problem Relation Age of Onset   Seizures Sister     Current Outpatient Medications  Medication Sig Dispense Refill   acetaminophen  (TYLENOL ) 325 MG tablet Take 325-650 mg by mouth every 6 (six) hours as needed for mild pain (pain score 1-3) or headache (or lower back pain).     aspirin EC 81 MG tablet Take 81 mg by mouth daily. Swallow whole.     atorvastatin  (LIPITOR) 10 MG tablet Take 1 tablet (10 mg total) by mouth daily. 90 tablet 30   calcitRIOL (ROCALTROL) 0.25 MCG capsule Take 0.25 mcg by mouth daily.     Calcium  Carbonate-Vitamin D 600-3.125 MG-MCG TABS 1 tablet Orally Twice a day     carboxymethylcellulose (REFRESH PLUS) 0.5 % SOLN Place 1 drop into both eyes 4 (four) times daily as needed (dry eyes).  cetirizine (ZYRTEC) 10 MG tablet Take 10 mg by mouth daily.     chlorpheniramine (CHLOR-TRIMETON) 4 MG tablet 1 tablet as needed Orally every 6 hrs     Cholecalciferol (VITAMIN D-3) 1000 units CAPS Take 1,000 Units by mouth daily.     ferrous sulfate 325 (65 FE) MG EC tablet Take 325 mg by mouth every Monday, Wednesday, and Friday.     fluticasone (FLONASE) 50 MCG/ACT nasal spray Place 2 sprays into the nose.     folic acid (FOLVITE) 1 MG tablet Take 1 mg by mouth daily.     ketotifen (ZADITOR) 0.025 % ophthalmic solution Place 1 drop into both eyes 2 (two) times daily as needed (for itching).     lacosamide  (VIMPAT ) 50 MG TABS tablet Take 1 tablet (50 mg total) by mouth 2 (two) times daily. (Patient not taking: Reported on 12/18/2023) 60 tablet 5   losartan (COZAAR) 25 MG tablet Take 25 mg by mouth daily.     mirabegron  ER  (MYRBETRIQ ) 50 MG TB24 tablet Take 50 mg by mouth See admin instructions. Take 50 mg by mouth once a day and stop if B/P approaches 150/100 (Patient not taking: Reported on 02/29/2024)     Omega-3 Fatty Acids (FISH OIL PO) Take 1 capsule by mouth daily with breakfast.     simethicone (MYLICON) 80 MG chewable tablet Chew 160 mg by mouth every 6 (six) hours as needed for flatulence.     sodium bicarbonate 650 MG tablet Take by mouth.     sodium chloride  (OCEAN) 0.65 % SOLN nasal spray Place 1 spray into both nostrils as needed for congestion.     tamsulosin  (FLOMAX ) 0.4 MG CAPS capsule Take 0.4 mg by mouth at bedtime.     traZODone  (DESYREL ) 50 MG tablet Take by mouth.     vardenafil (LEVITRA) 20 MG tablet Take by mouth. (Patient not taking: Reported on 02/29/2024)     Vibegron 75 MG TABS Take by mouth.     No current facility-administered medications for this visit.    Allergies  Allergen Reactions   Trospium Other (See Comments)    Reaction not known     REVIEW OF SYSTEMS:  *** [X]  denotes positive finding, [ ]  denotes negative finding Cardiac  Comments:  Chest pain or chest pressure:    Shortness of breath upon exertion:    Short of breath when lying flat:    Irregular heart rhythm:        Vascular    Pain in calf, thigh, or hip brought on by ambulation:    Pain in feet at night that wakes you up from your sleep:     Blood clot in your veins:    Leg swelling:         Pulmonary    Oxygen at home:    Productive cough:     Wheezing:         Neurologic    Sudden weakness in arms or legs:     Sudden numbness in arms or legs:     Sudden onset of difficulty speaking or slurred speech:    Temporary loss of vision in one eye:     Problems with dizziness:         Gastrointestinal    Blood in stool:     Vomited blood:         Genitourinary    Burning when urinating:     Blood in urine:  Psychiatric    Major depression:         Hematologic    Bleeding problems:     Problems with blood clotting too easily:        Skin    Rashes or ulcers:        Constitutional    Fever or chills:      PHYSICAL EXAMINATION:  There were no vitals filed for this visit.  General:  WDWN in NAD; vital signs documented above Gait: Not observed HENT: WNL, normocephalic Pulmonary: normal non-labored breathing , without Rales, rhonchi,  wheezing Cardiac: {Desc; regular/irreg:14544} HR, without  Murmurs {With/Without:20273} carotid bruit*** Abdomen: soft, NT, no masses Skin: {With/Without:20273} rashes Vascular Exam/Pulses:  Right Left  Radial {Exam; arterial pulse strength 0-4:30167} {Exam; arterial pulse strength 0-4:30167}  Ulnar {Exam; arterial pulse strength 0-4:30167} {Exam; arterial pulse strength 0-4:30167}  Femoral {Exam; arterial pulse strength 0-4:30167} {Exam; arterial pulse strength 0-4:30167}  Popliteal {Exam; arterial pulse strength 0-4:30167} {Exam; arterial pulse strength 0-4:30167}  DP {Exam; arterial pulse strength 0-4:30167} {Exam; arterial pulse strength 0-4:30167}  PT {Exam; arterial pulse strength 0-4:30167} {Exam; arterial pulse strength 0-4:30167}   Extremities: {With/Without:20273} ischemic changes, {With/Without:20273} Gangrene , {With/Without:20273} cellulitis; {With/Without:20273} open wounds;  Musculoskeletal: no muscle wasting or atrophy  Neurologic: A&O X 3;  No focal weakness or paresthesias are detected Psychiatric:  The pt has {Desc; normal/abnormal:11317::Normal} affect.   Non-Invasive Vascular Imaging:   ***    ASSESSMENT/PLAN:  Lance Todd is a 83 y.o. male who presents with {KidneyDisease:19197::end stage renal disease,chronic kidney disease stage ***}  Based on vein mapping and examination, ***. I had an extensive discussion with this patient in regards to the nature of access surgery, including risk, benefits, and alternatives.   The patient is aware that the risks of access surgery include but are not  limited to: bleeding, infection, steal syndrome, nerve damage, ischemic monomelic neuropathy, failure of access to mature, complications related to venous hypertension, and possible need for additional access procedures in the future. *** I discussed with the patient the nature of the staged access procedure, specifically the need for a second operation to transpose the first stage fistula if it matures adequately.   The patient has *** agreed to proceed with the above procedure which will be scheduled ***.  Fonda FORBES Rim, MD [facility] [phone]

## 2024-07-13 ENCOUNTER — Ambulatory Visit (HOSPITAL_BASED_OUTPATIENT_CLINIC_OR_DEPARTMENT_OTHER)
Admission: RE | Admit: 2024-07-13 | Discharge: 2024-07-13 | Disposition: A | Discharge status: Home / Self Care | Source: Ambulatory Visit | Attending: Vascular Surgery | Admitting: Vascular Surgery

## 2024-07-13 ENCOUNTER — Telehealth: Payer: Self-pay

## 2024-07-13 ENCOUNTER — Ambulatory Visit (HOSPITAL_COMMUNITY)
Admission: RE | Admit: 2024-07-13 | Discharge: 2024-07-13 | Disposition: A | Discharge status: Home / Self Care | Source: Ambulatory Visit | Attending: Vascular Surgery | Admitting: Vascular Surgery

## 2024-07-13 ENCOUNTER — Encounter: Payer: Self-pay | Admitting: Vascular Surgery

## 2024-07-13 ENCOUNTER — Ambulatory Visit: Attending: Vascular Surgery | Admitting: Vascular Surgery

## 2024-07-13 VITALS — BP 127/71 | HR 65 | Temp 98.1°F | Resp 18 | Ht 64.0 in | Wt 152.8 lb

## 2024-07-13 DIAGNOSIS — N184 Chronic kidney disease, stage 4 (severe): Secondary | ICD-10-CM

## 2024-07-13 DIAGNOSIS — N189 Chronic kidney disease, unspecified: Secondary | ICD-10-CM

## 2024-07-13 NOTE — Telephone Encounter (Signed)
 This nurse engaged with [name], RN @ [facility] re: patient's current CKD level per Dr. Lanis request.  Per Lance Todd, last lab values were EGFR: 17 and Creatine 3.48 (May 2025).  Patient's next office visit and lab draw is October 15th, 2025. Dr. Lanis informed.  We will reassess at a later time.  Lance Todd JASMINE [facility]) [phone] x [phone]

## 2024-08-03 ENCOUNTER — Ambulatory Visit: Admitting: Vascular Surgery

## 2024-09-13 DIAGNOSIS — N184 Chronic kidney disease, stage 4 (severe): Secondary | ICD-10-CM | POA: Diagnosis not present

## 2024-09-13 DIAGNOSIS — Z0000 Encounter for general adult medical examination without abnormal findings: Secondary | ICD-10-CM | POA: Diagnosis not present

## 2024-09-13 DIAGNOSIS — C61 Malignant neoplasm of prostate: Secondary | ICD-10-CM | POA: Diagnosis not present

## 2024-09-13 DIAGNOSIS — E782 Mixed hyperlipidemia: Secondary | ICD-10-CM | POA: Diagnosis not present

## 2024-09-13 DIAGNOSIS — Z23 Encounter for immunization: Secondary | ICD-10-CM | POA: Diagnosis not present

## 2024-09-13 DIAGNOSIS — R7303 Prediabetes: Secondary | ICD-10-CM | POA: Diagnosis not present

## 2024-09-13 DIAGNOSIS — N400 Enlarged prostate without lower urinary tract symptoms: Secondary | ICD-10-CM | POA: Diagnosis not present
